# Patient Record
Sex: Female | Born: 1935 | Race: White | Hispanic: No | Marital: Married | Smoking: Former smoker
Health system: Southern US, Community
[De-identification: ages and names within clinical notes are randomized; demographics above are authoritative.]

## PROBLEM LIST (undated history)

## (undated) DIAGNOSIS — E785 Hyperlipidemia, unspecified: Secondary | ICD-10-CM

## (undated) DIAGNOSIS — M1990 Unspecified osteoarthritis, unspecified site: Secondary | ICD-10-CM

## (undated) DIAGNOSIS — K219 Gastro-esophageal reflux disease without esophagitis: Secondary | ICD-10-CM

## (undated) DIAGNOSIS — D649 Anemia, unspecified: Secondary | ICD-10-CM

## (undated) DIAGNOSIS — Z5189 Encounter for other specified aftercare: Secondary | ICD-10-CM

## (undated) DIAGNOSIS — K5790 Diverticulosis of intestine, part unspecified, without perforation or abscess without bleeding: Secondary | ICD-10-CM

## (undated) DIAGNOSIS — H3530 Unspecified macular degeneration: Secondary | ICD-10-CM

## (undated) DIAGNOSIS — M810 Age-related osteoporosis without current pathological fracture: Secondary | ICD-10-CM

## (undated) DIAGNOSIS — IMO0002 Reserved for concepts with insufficient information to code with codable children: Secondary | ICD-10-CM

## (undated) DIAGNOSIS — Z860100 Personal history of colon polyps, unspecified: Secondary | ICD-10-CM

## (undated) DIAGNOSIS — I6381 Other cerebral infarction due to occlusion or stenosis of small artery: Secondary | ICD-10-CM

## (undated) DIAGNOSIS — G4730 Sleep apnea, unspecified: Secondary | ICD-10-CM

## (undated) DIAGNOSIS — K222 Esophageal obstruction: Secondary | ICD-10-CM

## (undated) DIAGNOSIS — H269 Unspecified cataract: Secondary | ICD-10-CM

## (undated) DIAGNOSIS — G43009 Migraine without aura, not intractable, without status migrainosus: Secondary | ICD-10-CM

## (undated) DIAGNOSIS — K449 Diaphragmatic hernia without obstruction or gangrene: Secondary | ICD-10-CM

## (undated) HISTORY — DX: Age-related osteoporosis without current pathological fracture: M81.0

## (undated) HISTORY — DX: Unspecified cataract: H26.9

## (undated) HISTORY — DX: Personal history of colonic polyps: Z86.010

## (undated) HISTORY — DX: Sleep apnea, unspecified: G47.30

## (undated) HISTORY — PX: COLONOSCOPY: SHX174

## (undated) HISTORY — DX: Hyperlipidemia, unspecified: E78.5

## (undated) HISTORY — PX: TOTAL ABDOMINAL HYSTERECTOMY: SHX209

## (undated) HISTORY — DX: Reserved for concepts with insufficient information to code with codable children: IMO0002

## (undated) HISTORY — DX: Unspecified osteoarthritis, unspecified site: M19.90

## (undated) HISTORY — PX: ESOPHAGEAL DILATION: SHX303

## (undated) HISTORY — PX: CHOLECYSTECTOMY: SHX55

## (undated) HISTORY — DX: Other cerebral infarction due to occlusion or stenosis of small artery: I63.81

## (undated) HISTORY — DX: Anemia, unspecified: D64.9

## (undated) HISTORY — PX: INGUINAL HERNIA REPAIR: SHX194

## (undated) HISTORY — DX: Encounter for other specified aftercare: Z51.89

## (undated) HISTORY — DX: Unspecified macular degeneration: H35.30

## (undated) HISTORY — DX: Esophageal obstruction: K22.2

## (undated) HISTORY — PX: EYE SURGERY: SHX253

## (undated) HISTORY — DX: Diverticulosis of intestine, part unspecified, without perforation or abscess without bleeding: K57.90

## (undated) HISTORY — DX: Gastro-esophageal reflux disease without esophagitis: K21.9

## (undated) HISTORY — DX: Migraine without aura, not intractable, without status migrainosus: G43.009

## (undated) HISTORY — DX: Personal history of colon polyps, unspecified: Z86.0100

## (undated) HISTORY — DX: Diaphragmatic hernia without obstruction or gangrene: K44.9

## (undated) HISTORY — PX: UPPER GASTROINTESTINAL ENDOSCOPY: SHX188

---

## 2002-02-28 ENCOUNTER — Encounter (INDEPENDENT_AMBULATORY_CARE_PROVIDER_SITE_OTHER): Payer: Self-pay | Admitting: *Deleted

## 2002-02-28 LAB — CONVERTED CEMR LAB

## 2003-05-22 ENCOUNTER — Encounter (INDEPENDENT_AMBULATORY_CARE_PROVIDER_SITE_OTHER): Payer: Self-pay | Admitting: Specialist

## 2003-05-22 ENCOUNTER — Ambulatory Visit (HOSPITAL_BASED_OUTPATIENT_CLINIC_OR_DEPARTMENT_OTHER): Admission: RE | Admit: 2003-05-22 | Discharge: 2003-05-22 | Payer: Self-pay | Admitting: *Deleted

## 2003-06-20 ENCOUNTER — Encounter: Payer: Self-pay | Admitting: Internal Medicine

## 2003-06-20 ENCOUNTER — Ambulatory Visit (HOSPITAL_COMMUNITY): Admission: RE | Admit: 2003-06-20 | Discharge: 2003-06-20 | Payer: Self-pay | Admitting: Internal Medicine

## 2004-07-22 ENCOUNTER — Ambulatory Visit (HOSPITAL_COMMUNITY): Admission: RE | Admit: 2004-07-22 | Discharge: 2004-07-22 | Payer: Self-pay | Admitting: Internal Medicine

## 2004-09-19 ENCOUNTER — Encounter: Admission: RE | Admit: 2004-09-19 | Discharge: 2004-09-19 | Payer: Self-pay | Admitting: Internal Medicine

## 2004-10-03 ENCOUNTER — Ambulatory Visit: Payer: Self-pay | Admitting: Internal Medicine

## 2005-03-30 ENCOUNTER — Ambulatory Visit: Payer: Self-pay | Admitting: Internal Medicine

## 2005-04-10 ENCOUNTER — Ambulatory Visit: Payer: Self-pay | Admitting: Internal Medicine

## 2005-07-02 ENCOUNTER — Ambulatory Visit: Payer: Self-pay | Admitting: Internal Medicine

## 2005-07-10 ENCOUNTER — Ambulatory Visit: Payer: Self-pay

## 2005-08-25 ENCOUNTER — Ambulatory Visit: Payer: Self-pay | Admitting: Internal Medicine

## 2005-11-17 ENCOUNTER — Ambulatory Visit: Payer: Self-pay | Admitting: Internal Medicine

## 2006-01-26 ENCOUNTER — Ambulatory Visit (HOSPITAL_COMMUNITY): Admission: RE | Admit: 2006-01-26 | Discharge: 2006-01-26 | Payer: Self-pay | Admitting: Internal Medicine

## 2006-08-24 ENCOUNTER — Ambulatory Visit: Payer: Self-pay | Admitting: Internal Medicine

## 2006-08-31 ENCOUNTER — Ambulatory Visit: Payer: Self-pay | Admitting: Internal Medicine

## 2006-09-17 ENCOUNTER — Ambulatory Visit: Payer: Self-pay | Admitting: Internal Medicine

## 2007-01-28 ENCOUNTER — Ambulatory Visit: Payer: Self-pay | Admitting: Internal Medicine

## 2007-01-28 ENCOUNTER — Ambulatory Visit (HOSPITAL_COMMUNITY): Admission: RE | Admit: 2007-01-28 | Discharge: 2007-01-28 | Payer: Self-pay | Admitting: Geriatric Medicine

## 2007-02-16 ENCOUNTER — Ambulatory Visit: Payer: Self-pay | Admitting: Internal Medicine

## 2007-02-16 LAB — CONVERTED CEMR LAB
Cholesterol: 190 mg/dL
Glucose, Bld: 104 mg/dL — ABNORMAL HIGH
HDL: 58.8 mg/dL
LDL Cholesterol: 101 mg/dL — ABNORMAL HIGH
Total CHOL/HDL Ratio: 3.2
Triglycerides: 150 mg/dL — ABNORMAL HIGH
VLDL: 30 mg/dL

## 2007-05-09 ENCOUNTER — Encounter: Payer: Self-pay | Admitting: Internal Medicine

## 2007-06-10 ENCOUNTER — Encounter (INDEPENDENT_AMBULATORY_CARE_PROVIDER_SITE_OTHER): Payer: Self-pay | Admitting: *Deleted

## 2007-09-27 DIAGNOSIS — G43009 Migraine without aura, not intractable, without status migrainosus: Secondary | ICD-10-CM | POA: Insufficient documentation

## 2007-09-27 DIAGNOSIS — K219 Gastro-esophageal reflux disease without esophagitis: Secondary | ICD-10-CM | POA: Insufficient documentation

## 2007-09-27 DIAGNOSIS — IMO0002 Reserved for concepts with insufficient information to code with codable children: Secondary | ICD-10-CM | POA: Insufficient documentation

## 2007-10-13 DIAGNOSIS — D126 Benign neoplasm of colon, unspecified: Secondary | ICD-10-CM | POA: Insufficient documentation

## 2007-12-01 DIAGNOSIS — K449 Diaphragmatic hernia without obstruction or gangrene: Secondary | ICD-10-CM

## 2007-12-01 HISTORY — DX: Diaphragmatic hernia without obstruction or gangrene: K44.9

## 2008-01-03 ENCOUNTER — Encounter: Payer: Self-pay | Admitting: Internal Medicine

## 2008-02-06 ENCOUNTER — Encounter (INDEPENDENT_AMBULATORY_CARE_PROVIDER_SITE_OTHER): Payer: Self-pay | Admitting: *Deleted

## 2008-02-06 DIAGNOSIS — E785 Hyperlipidemia, unspecified: Secondary | ICD-10-CM | POA: Insufficient documentation

## 2008-02-06 DIAGNOSIS — I6350 Cerebral infarction due to unspecified occlusion or stenosis of unspecified cerebral artery: Secondary | ICD-10-CM | POA: Insufficient documentation

## 2008-02-07 ENCOUNTER — Ambulatory Visit (HOSPITAL_COMMUNITY): Admission: RE | Admit: 2008-02-07 | Discharge: 2008-02-07 | Payer: Self-pay | Admitting: Internal Medicine

## 2008-02-10 ENCOUNTER — Ambulatory Visit: Payer: Self-pay | Admitting: Internal Medicine

## 2008-02-10 DIAGNOSIS — R1319 Other dysphagia: Secondary | ICD-10-CM | POA: Insufficient documentation

## 2008-02-10 DIAGNOSIS — R252 Cramp and spasm: Secondary | ICD-10-CM | POA: Insufficient documentation

## 2008-02-10 DIAGNOSIS — R51 Headache: Secondary | ICD-10-CM | POA: Insufficient documentation

## 2008-02-10 DIAGNOSIS — R519 Headache, unspecified: Secondary | ICD-10-CM | POA: Insufficient documentation

## 2008-02-10 LAB — CONVERTED CEMR LAB
Cholesterol, target level: 200 mg/dL
HDL goal, serum: 40 mg/dL
LDL Goal: 100 mg/dL

## 2008-02-11 LAB — CONVERTED CEMR LAB
ALT: 100 units/L — ABNORMAL HIGH (ref 0–35)
AST: 29 units/L (ref 0–37)
Albumin: 4 g/dL (ref 3.5–5.2)
Alkaline Phosphatase: 71 units/L (ref 39–117)
BUN: 10 mg/dL (ref 6–23)
Basophils Absolute: 0.1 10*3/uL (ref 0.0–0.1)
Basophils Relative: 1 % (ref 0.0–1.0)
Bilirubin, Direct: 0.1 mg/dL (ref 0.0–0.3)
CO2: 29 meq/L (ref 19–32)
Calcium: 9.3 mg/dL (ref 8.4–10.5)
Chloride: 104 meq/L (ref 96–112)
Cholesterol: 195 mg/dL (ref 0–200)
Creatinine, Ser: 0.7 mg/dL (ref 0.4–1.2)
Eosinophils Absolute: 0.2 10*3/uL (ref 0.0–0.6)
Eosinophils Relative: 2 % (ref 0.0–5.0)
GFR calc Af Amer: 107 mL/min
GFR calc non Af Amer: 88 mL/min
Glucose, Bld: 81 mg/dL (ref 70–99)
HCT: 41.6 % (ref 36.0–46.0)
HDL: 56.2 mg/dL (ref >39.0)
Hemoglobin: 13.6 g/dL (ref 12.0–15.0)
Hgb A1c MFr Bld: 6 % (ref 4.6–6.0)
Iron: 43 ug/dL (ref 42–145)
LDL Cholesterol: 117 mg/dL — ABNORMAL HIGH (ref 0–99)
Lymphocytes Relative: 27.4 % (ref 12.0–46.0)
MCHC: 32.8 g/dL (ref 30.0–36.0)
MCV: 79.3 fL (ref 78.0–100.0)
Monocytes Absolute: 0.7 10*3/uL (ref 0.2–0.7)
Monocytes Relative: 8.7 % (ref 3.0–11.0)
Neutro Abs: 4.9 10*3/uL (ref 1.4–7.7)
Neutrophils Relative %: 60.9 % (ref 43.0–77.0)
Platelets: 312 10*3/uL (ref 150–400)
Potassium: 3.9 meq/L (ref 3.5–5.1)
RBC: 5.24 M/uL — ABNORMAL HIGH (ref 3.87–5.11)
RDW: 14.8 % — ABNORMAL HIGH (ref 11.5–14.6)
Saturation Ratios: 9.8 % — ABNORMAL LOW (ref 20.0–50.0)
Sodium: 138 meq/L (ref 135–145)
TSH: 1.36 microintl units/mL (ref 0.35–5.50)
Total Bilirubin: 0.7 mg/dL (ref 0.3–1.2)
Total CHOL/HDL Ratio: 3.5
Total Protein: 7.2 g/dL (ref 6.0–8.3)
Transferrin: 314.4 mg/dL (ref >=212.0)
Triglycerides: 110 mg/dL (ref 0–149)
VLDL: 22 mg/dL (ref 0–40)
WBC: 8.1 10*3/uL (ref 4.5–10.5)

## 2008-02-13 ENCOUNTER — Encounter (INDEPENDENT_AMBULATORY_CARE_PROVIDER_SITE_OTHER): Payer: Self-pay | Admitting: *Deleted

## 2008-02-18 LAB — CONVERTED CEMR LAB: Vit D, 1,25-Dihydroxy: 29 — ABNORMAL LOW (ref 30–89)

## 2008-02-20 ENCOUNTER — Encounter (INDEPENDENT_AMBULATORY_CARE_PROVIDER_SITE_OTHER): Payer: Self-pay | Admitting: *Deleted

## 2008-03-01 ENCOUNTER — Encounter: Payer: Self-pay | Admitting: Internal Medicine

## 2008-03-05 ENCOUNTER — Encounter (INDEPENDENT_AMBULATORY_CARE_PROVIDER_SITE_OTHER): Payer: Self-pay | Admitting: *Deleted

## 2008-03-05 ENCOUNTER — Ambulatory Visit: Payer: Self-pay | Admitting: Internal Medicine

## 2008-03-05 LAB — CONVERTED CEMR LAB
OCCULT 1: NEGATIVE
OCCULT 2: NEGATIVE
OCCULT 3: NEGATIVE

## 2008-03-19 ENCOUNTER — Ambulatory Visit: Payer: Self-pay | Admitting: Internal Medicine

## 2008-03-21 ENCOUNTER — Ambulatory Visit (HOSPITAL_COMMUNITY): Admission: RE | Admit: 2008-03-21 | Discharge: 2008-03-21 | Payer: Self-pay | Admitting: Internal Medicine

## 2008-03-26 ENCOUNTER — Encounter: Payer: Self-pay | Admitting: Internal Medicine

## 2008-03-27 ENCOUNTER — Ambulatory Visit: Payer: Self-pay | Admitting: Internal Medicine

## 2008-03-27 ENCOUNTER — Encounter: Payer: Self-pay | Admitting: Internal Medicine

## 2008-03-27 DIAGNOSIS — K2900 Acute gastritis without bleeding: Secondary | ICD-10-CM | POA: Insufficient documentation

## 2008-04-25 ENCOUNTER — Telehealth (INDEPENDENT_AMBULATORY_CARE_PROVIDER_SITE_OTHER): Payer: Self-pay | Admitting: *Deleted

## 2008-05-02 ENCOUNTER — Ambulatory Visit: Payer: Self-pay | Admitting: Internal Medicine

## 2008-05-02 DIAGNOSIS — R1013 Epigastric pain: Secondary | ICD-10-CM | POA: Insufficient documentation

## 2008-05-02 DIAGNOSIS — K222 Esophageal obstruction: Secondary | ICD-10-CM | POA: Insufficient documentation

## 2008-05-02 DIAGNOSIS — K5730 Diverticulosis of large intestine without perforation or abscess without bleeding: Secondary | ICD-10-CM | POA: Insufficient documentation

## 2008-05-02 DIAGNOSIS — K5732 Diverticulitis of large intestine without perforation or abscess without bleeding: Secondary | ICD-10-CM | POA: Insufficient documentation

## 2008-05-08 ENCOUNTER — Telehealth: Payer: Self-pay | Admitting: Internal Medicine

## 2008-06-22 ENCOUNTER — Encounter: Payer: Self-pay | Admitting: Internal Medicine

## 2008-07-26 ENCOUNTER — Encounter: Admission: RE | Admit: 2008-07-26 | Discharge: 2008-07-26 | Payer: Self-pay | Admitting: General Surgery

## 2008-08-03 ENCOUNTER — Encounter: Payer: Self-pay | Admitting: Internal Medicine

## 2008-08-24 ENCOUNTER — Encounter: Payer: Self-pay | Admitting: Internal Medicine

## 2008-10-16 ENCOUNTER — Encounter: Payer: Self-pay | Admitting: Internal Medicine

## 2008-10-17 ENCOUNTER — Telehealth (INDEPENDENT_AMBULATORY_CARE_PROVIDER_SITE_OTHER): Payer: Self-pay | Admitting: *Deleted

## 2008-10-17 DIAGNOSIS — M899 Disorder of bone, unspecified: Secondary | ICD-10-CM | POA: Insufficient documentation

## 2008-10-17 DIAGNOSIS — M949 Disorder of cartilage, unspecified: Secondary | ICD-10-CM

## 2008-12-05 ENCOUNTER — Encounter: Payer: Self-pay | Admitting: Internal Medicine

## 2009-05-13 ENCOUNTER — Encounter: Payer: Self-pay | Admitting: Internal Medicine

## 2009-09-05 ENCOUNTER — Telehealth (INDEPENDENT_AMBULATORY_CARE_PROVIDER_SITE_OTHER): Payer: Self-pay | Admitting: *Deleted

## 2009-09-05 ENCOUNTER — Ambulatory Visit: Payer: Self-pay | Admitting: Internal Medicine

## 2009-10-04 ENCOUNTER — Encounter: Payer: Self-pay | Admitting: Internal Medicine

## 2009-11-13 ENCOUNTER — Ambulatory Visit (HOSPITAL_BASED_OUTPATIENT_CLINIC_OR_DEPARTMENT_OTHER): Admission: RE | Admit: 2009-11-13 | Discharge: 2009-11-13 | Payer: Self-pay | Admitting: General Surgery

## 2009-12-05 ENCOUNTER — Encounter: Payer: Self-pay | Admitting: Internal Medicine

## 2010-12-18 ENCOUNTER — Other Ambulatory Visit (HOSPITAL_COMMUNITY): Payer: Self-pay | Admitting: Internal Medicine

## 2010-12-18 DIAGNOSIS — Z780 Asymptomatic menopausal state: Secondary | ICD-10-CM

## 2010-12-18 DIAGNOSIS — Z0000 Encounter for general adult medical examination without abnormal findings: Secondary | ICD-10-CM

## 2010-12-20 ENCOUNTER — Encounter: Payer: Self-pay | Admitting: Internal Medicine

## 2010-12-21 ENCOUNTER — Encounter: Payer: Self-pay | Admitting: Internal Medicine

## 2010-12-30 NOTE — Procedures (Signed)
Summary: Gastroenterology colon  Gastroenterology colon   Imported By: Milford Cage CMA 05/01/2008 10:43:52  _____________________________________________________________________  External Attachment:    Type:   Image     Comment:   External Document

## 2010-12-30 NOTE — Letter (Signed)
Summary: Primary Care Consult Scheduled Letter  [facility]  [address] [place], [place] [address]   Phone: [phone]  Fax: [phone]      02/13/2008 MRN: [id number]  [facility] [address] [place], [place]  [address]    Dear [name] [name],      We have scheduled an appointment for you.  At the recommendation of Dr.HOPPER, we have scheduled you a consult with DR Marina Goodell [facility] [address] 3RD FLOOR on 04.20.09 @ 10:30 CK IN 10:15.  Their phone number is [phone].  If this appointment day and time is not convenient for you, please feel free to call the office of the doctor you are being referred to at the number listed above and reschedule the appointment.     It is important for you to keep your scheduled appointments. We are here to make sure you are given good patient care. If yu have questions or you have made changes to your appointment, please notify us at  [phone], ask for [name].    Thank you,  Patient Care Coordinator  at [facility]

## 2010-12-30 NOTE — Letter (Signed)
Summary: Results Follow up Letter  [facility] [place]  [address] [place], [place] [address]   Phone: [phone]  Fax: [phone]    02/20/2008 MRN: [id number]  [facility] [address] [place], [place]  [address]  Dear [name] [name],  The following are the results of your recent test(s):  Test         Result    Pap Smear:        Normal _____  Not Normal _____ Comments: ______________________________________________________ Cholesterol: LDL(Bad cholesterol):         Your goal is less than:         HDL (Good cholesterol):       Your goal is more than: Comments:  ______________________________________________________ Mammogram:        Normal _____  Not Normal _____ Comments:  ___________________________________________________________________ Hemoccult:        Normal _____  Not normal _______ Comments:    _____________________________________________________________________ Other Tests:  Please see attached results and comments   We routinely do not discuss normal results over the telephone.  If you desire a copy of the results, or you have any questions about this information we can discuss them at your next office visit.   Sincerely,

## 2010-12-30 NOTE — Progress Notes (Signed)
Summary: hop--order for bone destiny  Phone Note Call from Patient Call back at Home Phone [phone]   Caller: Mom Summary of Call: Patient is calling for an orders for a bone destiny  to be fax today.--[phone] before 3 pm. Initial call taken by: Freddy Jaksch,  October 17, 2008 9:51 AM  Follow-up for Phone Call        FAXED Follow-up by: Shonna Chock,  October 17, 2008 2:19 PM  New Problems: OSTEOPENIA (ICD-733.90)   New Problems: OSTEOPENIA (ICD-733.90)

## 2010-12-30 NOTE — Letter (Signed)
Summary: Results Follow up Letter  [facility] [place]  [address] [place], [place] [address]   Phone: [phone]  Fax: [phone]    03/05/2008 MRN: [id number]  [facility] [address] [place], [place]  [address]  Dear Ms. Tortora,  The following are the results of your recent test(s):  Test         Result    Pap Smear:        Normal _____  Not Normal _____ Comments: ______________________________________________________ Cholesterol: LDL(Bad cholesterol):         Your goal is less than:         HDL (Good cholesterol):       Your goal is more than: Comments:  ______________________________________________________ Mammogram:        Normal _____  Not Normal _____ Comments:  ___________________________________________________________________ Hemoccult:        Normal _X____  Not normal _______ Comments:    _____________________________________________________________________ Other Tests:    We routinely do not discuss normal results over the telephone.  If you desire a copy of the results, or you have any questions about this information we can discuss them at your next office visit.   Sincerely,

## 2010-12-30 NOTE — Letter (Signed)
Summary: [facility]   Imported By: Lanelle Bal 12/23/2009 10:15:51  _____________________________________________________________________  External Attachment:    Type:   Image     Comment:   External Document

## 2010-12-30 NOTE — Procedures (Signed)
Summary: Colonoscopy  Colonoscopy   Imported By: Freddy Jaksch 04/03/2008 11:36:41  _____________________________________________________________________  External Attachment:    Type:   Image     Comment:   External Document  Appended Document: Colonoscopy Tics & tiny polyp

## 2010-12-30 NOTE — Letter (Signed)
Summary: [facility]   Imported By: Lanelle Bal 12/23/2009 10:16:47  _____________________________________________________________________  External Attachment:    Type:   Image     Comment:   External Document

## 2010-12-30 NOTE — Progress Notes (Signed)
Summary: ? regarding referral to another dr  Phone Note Call from Patient Call back at Home Phone [phone]   Call For: Marina Goodell Summary of Call: ? f/u to Dr Johna Sheriff...are they suppose to call his office for appointment. Rosanne Ashing, spouse called. Patient's chart has been requested.  Initial call taken by: Verdell Face,  May 08, 2008 10:46 AM  Follow-up for Phone Call        05-08-08 Called pt and [facility] that we faxed Daissy's information to Dr Romeo Apple Hoxworth's office on 05-03-08.  We were told they would be calling her with an appt.  I told them to call me early next week if they havn't heard anything from that office. Lowry Ram CMA

## 2010-12-30 NOTE — Medication Information (Signed)
Summary: Refill Authorization for Omeprazole/[facility]  Refill Authorization for Omeprazole/[facility]   Imported By: Maryln Gottron 05/15/2009 13:20:38  _____________________________________________________________________  External Attachment:    Type:   Image     Comment:   External Document

## 2010-12-30 NOTE — Consult Note (Signed)
Summary: [facility]   Imported By: Lanelle Bal 07/04/2008 12:25:01  _____________________________________________________________________  External Attachment:    Type:   Image     Comment:   External Document  Appended Document: [facility] Dr Johna Sheriff : gallstone; surgery planned

## 2010-12-30 NOTE — Assessment & Plan Note (Signed)
Summary: HEADACHES GETTING WORSE/RH.....   Vital Signs:  Patient profile:   75 year old female Weight:      157.4 pounds BMI:     27.12 Temp:     98.6 degrees F oral Pulse rate:   80 / minute Resp:     17 per minute BP sitting:   138 / 78  (left arm) Cuff size:   large  Vitals Entered By: Shonna Chock (September 05, 2009 12:12 PM) CC: 1.) Patient would like a referral to Dr.Love to address Headaches  2.) Rash on stomach-? if related to Excedrin 3.)Knot in lower abdominal area-right side Comments REVIEWED MED LIST, PATIENT AGREED DOSE AND INSTRUCTION CORRECT    Primary Care Provider:  Marga Melnick MD  CC:  1.) Patient would like a referral to Dr.Love to address Headaches  2.) Rash on stomach-? if related to Excedrin 3.)Knot in lower abdominal area-right side.  History of Present Illness: She has daily diffuse, non specific  headaches in different locations. No prodrome or aura with headaches. Headaches last for 2 hours to 3 days. "Dr Clarisse Gouge said he did not know what caused them" . MRI negative except for 1or 2 "minimstrokes".Previously on Lithium with some improvement but "I didn't want to stay on it".  Allergies (verified): 1)  ! Oxycodone Hcl (Oxycodone Hcl)  Review of Systems Neuro:  Denies brief paralysis, numbness, tingling, and weakness.  Physical Exam  General:  in no acute distress; alert,appropriate and cooperative throughout examination Eyes:  No corneal or conjunctival inflammation noted. EOMI. Perrla. Field of  Vision grossly normal. Mouth:  Oral mucosa and oropharynx without lesions or exudates.  Tongue w/o deviation Neurologic:  alert & oriented X3, cranial nerves II-XII intact, strength normal in all extremities, sensation to  light touch decreased L face, and DTRs symmetrical and normal.     Impression & Recommendations:  Problem # 1:  HEADACHE (ICD-784.0)  Complete Medication List: 1)  Omeprazole 20 Mg Cpdr (Omeprazole) .Marland Kitchen.. 1 by mouth once daily 2)   Multivitamins Tabs (Multiple vitamin) .... Take 1 tablet by mouth once a day 3)  Lutein Vision Blend Caps (Misc natural products) .Marland Kitchen.. 1 by mouth two times a day 4)  Calcium 500/d 500-200 Mg-unit Tabs (Calcium carbonate-vitamin d) .Marland Kitchen.. 1 by mouth once daily 5)  Topamax 25 Mg Tabs (Topiramate) .Marland Kitchen.. 1 at bedtime x 2 weeks then 2  at bedtime  Patient Instructions: 1)  Keep Headache Diary to assess response to Topamax . 2)  Please schedule a follow-up appointment in 1 month. Prescriptions: TOPAMAX 25 MG TABS (TOPIRAMATE) 1 at bedtime X 2 weeks then 2  at bedtime  #60 x 1   Entered and Authorized by:   Marga Melnick MD   Signed by:   Marga Melnick MD on 09/05/2009   Method used:   Print then Give to Patient   RxID:   [id number]

## 2010-12-30 NOTE — Procedures (Signed)
Summary: Gastroenterology EGD  Gastroenterology EGD   Imported By: Milford Cage CMA 05/01/2008 10:44:28  _____________________________________________________________________  External Attachment:    Type:   Image     Comment:   External Document

## 2010-12-30 NOTE — Letter (Signed)
Summary: [facility]  [facility]   Imported By: Job Founds 05/30/2007 11:29:59  _____________________________________________________________________  External Attachment:    Type:   Image     Comment:   External Document

## 2010-12-30 NOTE — Assessment & Plan Note (Signed)
Summary: rov per dr. Gwyneth Sprout $50 for NOS statedand written for pt./sh   History of Present Illness Visit Type: follow up Primary GI MD: Yancey Flemings MD Primary Provider: Marga Melnick MD Chief Complaint: continued epigastric pain, denies further dysphagia History of Present Illness:   [name] [name] presents today for followup. She was initially evaluated March 19, 2008 for reflux disease, abdominal pain, dysphagia, and surveillance colonoscopy. See that dictation for details. For her chronic reflux and dysphagia. Upper endoscopy was scheduled. Her acute intermittent epigastric pain was not felt to be necessarily related. Thus, an abdominal ultrasound was ordered. This revealed gallstones. Her upper endoscopy was performed March 27, 2008 and revealed a benign distal esophageal stricture, as well as mild gastritis. The stricture was dilated. Biopsies for Helicobacter pylori negative. Proton pump inhibitor continued. Colonoscopy, that same day, revealed diverticulosis and a diminutive colon polyp. She presents now for followup. She is currently on omeprazole 20 mg daily and rarely twice daily. Classic reflux symptoms are controlled. Dysphagia has resolved. She did experience one episode of epigastric/right-sided pain since her last visit. She is accompanied by her husband, [name] [name]. Her workup to date and my impressions were reviewed in detail. They had multiple excellent questions which were answered to their satisfaction.             Updated Prior Medication List: OMEPRAZOLE 20 MG  CPDR (OMEPRAZOLE) 1 by mouth bid MULTIVITAMINS   TABS (MULTIPLE VITAMIN) Take 1 tablet by mouth once a day LUTEIN VISION BLEND   CAPS (MISC NATURAL PRODUCTS) 1 by mouth two times a day CALCIUM 500/D 500-200 MG-UNIT  TABS (CALCIUM CARBONATE-VITAMIN D) 1 by mouth once daily VITAMIN E 1000 UNIT  CREA (VITAMIN E) 1 by mouth once daily  Current Allergies (reviewed today): No known allergies   Past Medical History:    Reviewed  history from 04/30/2008 and no changes required:       Current Problems:        Hx of LACUNAR INFARCTION (ICD-434.91)       HYPERLIPIDEMIA (ICD-272.4)       COLONIC POLYPS (ICD-211.3)       HERNIATED DISC (ICD-722.2)       COMMON MIGRAINE (ICD-346.10)       ACID REFLUX DISEASE (ICD-530.81)       Diverticulosis       Esophageal Stricture  Past Surgical History:    Hysterectomy & BSO    Inguinal herniorrhaphy-right (05/2003)    Herniated disc repair    G3 P3    Abnl. mammogram neg. on follow-up (2001)    Endoscopy- neg. (1999) in [place]; April 2009, [place]    Colonoscopy- polyp (2002)in [place]; April 2009, [place]   Family History:        Siblings: brother- Kidney CA                  sister- Brain Aneursym (died @ 61)    Maternal aunts:  Breast CA    Paternal aunts:  Breast CA    Family History of Colon Cancer:  Social History:    Never Smoked    Alcohol use-no    Retired. Volunteer work at the hospital    married with children    Review of Systems       noncontributory   Vital Signs:  Patient Profile:   75 Years Old Female Height:     64 inches Weight:      154 pounds BMI:     26.53 Pulse rate:  80 / minute Pulse rhythm:   regular BP sitting:   128 / 70  Vitals Entered By: Darcey Nora RN (May 02, 2008 8:31 AM)                  Physical Exam  General:     Well developed, well nourished, no acute distress. Head:     Normocephalic and atraumatic. Eyes:     PERRLA, no icterus. Lungs:     Clear throughout to auscultation. Heart:     Regular rate and rhythm; no murmurs, rubs,  or bruits. Abdomen:     Soft, nontender and nondistended. No masses, hepatosplenomegaly or hernias noted. Normal bowel sounds. Pulses:     Normal pulses noted. Extremities:     No clubbing, cyanosis, edema or deformities noted. Neurologic:     Alert and  oriented x4;  grossly normal neurologically. Skin:     Intact without significant lesions or rashes. Psych:      Alert and cooperative. Normal mood and affect.    Impression & Recommendations:  Problem # 1:  GERD (ICD-530.81) Symptoms controlled with proton pump inhibitor therapy, generally  once daily. Dysphagia resolved post dilation.  Plan: #1 continue proton pump inhibitor therapy. #2 reflux precautions. #3 followup as needed for breakthrough symptoms, refractory to medication or recurrent dysphagia  Problem # 2:  ESOPHAGEAL STRICTURE (ICD-530.3) Benign, secondary to reflux disease. Currently asymptomatic post dilation. Followup as needed, as discussed above.  Problem # 3:  ABDOMINAL PAIN-EPIGASTRIC (ICD-789.06) Recurrent episodic abdominal pain, most consistent with symptomatic cholelithiasis.  Plan: Refer to Dr. Glenna Fellows for consideration of laparoscopic cholecystectomy.  Problem # 4:  COLONIC POLYPS (ICD-211.3) Followup colonoscopy for problems only.  Problem # 5:  DIVERTICULOSIS-COLON (ICD-562.10) Marked changes on colonoscopy. Clinically asymptomatic.     ] Copy to: Dr. Glenna Fellows

## 2010-12-30 NOTE — Consult Note (Signed)
Summary: [facility]   Imported By: Esmeralda Links D'jimraou 09/18/2008 12:02:56  _____________________________________________________________________  External Attachment:    Type:   Image     Comment:   External Document

## 2010-12-30 NOTE — Letter (Signed)
Summary: Fax Requesting Order for Bone Density/[facility]  Fax Requesting Order for Bone [facility] Radiology Dept.   Imported By: Lanelle Bal 10/18/2008 09:08:01  _____________________________________________________________________  External Attachment:    Type:   Image     Comment:   External Document

## 2010-12-30 NOTE — Letter (Signed)
Summary: Results Follow up Letter  [facility] [place]  [address] [place], [place] [address]   Phone: [phone]  Fax: [phone]    02/13/2008 MRN: [id number]  [facility] [address] [place], [place]  [address]  Dear [name] [name],  The following are the results of your recent test(s):  Test         Result    Pap Smear:        Normal _____  Not Normal _____ Comments: ______________________________________________________ Cholesterol: LDL(Bad cholesterol):         Your goal is less than:         HDL (Good cholesterol):       Your goal is more than: Comments:  ______________________________________________________ Mammogram:        Normal _____  Not Normal _____ Comments:  ___________________________________________________________________ Hemoccult:        Normal _____  Not normal _______ Comments:    _____________________________________________________________________ Other Tests:  Please see attached results and comments   We routinely do not discuss normal results over the telephone.  If you desire a copy of the results, or you have any questions about this information we can discuss them at your next office visit.   Sincerely,

## 2010-12-30 NOTE — Letter (Signed)
Summary: [facility]   Imported By: Lanelle Bal 12/24/2008 10:35:02  _____________________________________________________________________  External Attachment:    Type:   Image     Comment:   External Document

## 2010-12-30 NOTE — Letter (Signed)
[facility]   Imported By: Freddy Jaksch 01/12/2008 11:36:35  _____________________________________________________________________  External Attachment:    Type:   Image     Comment:   External Document

## 2010-12-30 NOTE — Progress Notes (Signed)
Summary: generic   Phone Note Refill Request Message from:  Fax from Pharmacy on [facility] [address]  Refills Requested: Medication #1:  TOPAMAX 25 MG TABS 1 at bedtime X 2 weeks then 2  at bedtime. pt given discount card but it was expired so pt copay is very elevated. rx was written to [facility] wants to know if it is ok to dispense a generic of this med........................Marland KitchenFelecia Deloach CMA  September 05, 2009 4:28 PM    Follow-up for Phone Call        Per Dr.Hopper ok to give generic, spoke with pharmacist. Follow-up by: Shonna Chock,  September 05, 2009 5:11 PM

## 2010-12-30 NOTE — Procedures (Signed)
Summary: EGD  EGD   Imported By: Freddy Jaksch 04/03/2008 11:38:10  _____________________________________________________________________  External Attachment:    Type:   Image     Comment:   External Document  Appended Document: EGD Dr Marina Goodell: ERD,gastritis,stricture + gall stones with intermittent pain

## 2010-12-30 NOTE — Progress Notes (Signed)
Summary: BILLING SITUATION  Phone Note Call from Patient   Caller: Townsend Roger Call For: MANAGER-CYNTHIA Complaint: Earache/Ear Infection Summary of Call: Townsend Roger IS CALLING  ABOUT A BILL FOR HIS WIFE,THAT WAS DENIED BY THE INSURANCE COMPANY FOR LABS DRAWN ON 02/10/2008 (VIT-D LEVEL) PATIENT WAS TOLD BY [facility] THAT IT WAS CODED INCORRECTLEY.  MR.Merfeld HAS CONTACTED [facility] X 2, OUR BILLING DEPARTMENT X 1 AND THIS IS HIS SECOND TIME CALLING us. I COULD TELL  MR.Hallisey IS STARTING TO GET UPSET DUE TO BEING TOLD TO CALL THIS AND THAT PERSON AND NO ONE IS HELPING HIM. I DID EXPLAIN THAT IM NOT ADVANCED IN HANDLING THIS SITUATION OUR MANAGER IS OUT TODAY AND I WILL FORWARD THIS MESSAGE AND HAVE HER CONTACT HIM WHEN SHE RETURNS. MR.Tonelli OK'D THAT./Chrae Malloy  Apr 25, 2008 10:44 AM   Follow-up for Phone Call        CALLED SPECIUM...SP W/ VICKY..REQUEST TO CHANGE CODE TO PAIN IN LEG...729.5. SAYS OTHER CODE WAS NOT COVERED UNDER INS...  CALLED PT TO INFORM.Marland KitchenMarland KitchenN/A  .Marland KitchenDaine Gip  Apr 26, 2008 2:42 PM  CALLED PT LEFT MESSAGE ON [facility].Marland KitchenDaine Gip  Apr 26, 2008 4:44 PM Follow-up by: Daine Gip,  Apr 26, 2008 2:43 PM

## 2010-12-30 NOTE — Letter (Signed)
Summary: External Correspondence--OFFICE VISIT FROM [facility]  External Correspondence--[facility]   Imported By: Freddy Jaksch 06/16/2007 15:42:48  _____________________________________________________________________  External Attachment:    Type:   Image     Comment:   OFFICE VISIT

## 2010-12-30 NOTE — Assessment & Plan Note (Signed)
Summary: cpx  ph   Vital Signs:  Patient Profile:   75 Years Old Female Weight:      150 pounds Pulse rate:   60 / minute Pulse rhythm:   regular Resp:     16 per minute BP sitting:   110 / 70  (left arm) Cuff size:   large  Pt. in pain?   no  Vitals Entered By: Wendall Stade (February 10, 2008 11:14 AM)                  Chief Complaint:  cpx.  History of Present Illness:  The patient stopped  Lipitor  because of burning in legs on treadmill; this resolved off Lipitor. She still have  to leg cramps which are  severe. Bananas & kiwi help the leg cramps . She wants to discuss a smaller vit and cal tabs  as she gets choked on the large tabs.   Dyspepsia History:      The patient has positive alarm features of dyspepsia which include dysphagia.  There is a prior history of GERD.  The patient does not have a prior history of documented ulcer disease.  The dominant symptom is not heartburn or acid reflux.  An H-2 blocker medication is not currently being taken.  She has no history of a positive H. Pylori serology.  A prior EGD has been done which showed no evidence for moderate or severe esophagitis or Barrett's esophagus.    Lipid Management History:      Positive NCEP/ATP III risk factors include female age 55 years old or older, family history for ischemic heart disease (males less than 21 years old), and prior stroke (or TIA).  Negative NCEP/ATP III risk factors include non-diabetic, non-tobacco-user status, non-hypertensive, no ASHD (atherosclerotic heart disease), no peripheral vascular disease, and no history of aortic aneurysm.         Current Allergies (reviewed today): ! * PROTONIX ! * SEPTRA DS  Past Medical History:    Reviewed history from 02/06/2008 and no changes required:       Current Problems:        Hx of LACUNAR INFARCTION (ICD-434.91)       HYPERLIPIDEMIA (ICD-272.4)       COLONIC POLYPS (ICD-211.3)       HERNIATED DISC (ICD-722.2)       COMMON MIGRAINE  (ICD-346.10)       ACID REFLUX DISEASE (ICD-530.81)  Past Surgical History:    Hysterectomy & BSO    Inguinal herniorrhaphy-right (05/2003)    Herniated disc repair    G3 P3    Abnl. mammogram neg. on follow-up (2001)    Endoscopy- neg. (1999) in [place]    Colonoscopy- polyp (2002)   Family History:    Reviewed history from 02/06/2008 and no changes required:       Father:        Mother:        Siblings: brother- Kidney CA                     sister- Brain Aneursym (died @ 74)       Maternal aunts:  Breast CA       Paternal aunts:  Breast CA  Social History:    Reviewed history from 02/06/2008 and no changes required:       Never Smoked       Alcohol use-no       Retired    Review of Systems  General      Complains of chills.      Denies fatigue, fever, sleep disorder, sweats, and weight loss.      Nocturnal chills X 2  in past  2 months  Eyes      Denies blurring, discharge, double vision, eye pain, red eye, and vision loss-both eyes.      Last exam 2007  ENT      Complains of nasal congestion, ringing in ears, and sinus pressure.      Denies decreased hearing, difficulty swallowing, ear discharge, earache, hoarseness, nosebleeds, postnasal drainage, and sore throat.      Congestion q am  CV      Denies bluish discoloration of lips or nails, chest pain or discomfort, difficulty breathing at night, difficulty breathing while lying down, leg cramps with exertion, palpitations, shortness of breath with exertion, swelling of feet, and swelling of hands.  Resp      Denies cough, excessive snoring, hypersomnolence, morning headaches, shortness of breath, and sputum productive.  GI      Denies abdominal pain, bloody stools, change in bowel habits, constipation, dark tarry stools, diarrhea, indigestion, nausea, and vomiting.      Pill dysphagia  GU      Complains of urinary frequency.      Denies abnormal vaginal bleeding, discharge, dysuria, and hematuria.       Nocturia X 2; last GYN 2001. Mammograms done this yr  MS      Complains of joint pain and cramps.      Denies joint redness, joint swelling, low back pain, and thoracic pain.      Leg cramps @ night , not with CVE. Pain wrists & shoulders. BMD neg > 2 yrs ago  Derm      Complains of itching and rash.      Denies changes in color of skin, changes in nail beds, dryness, excessive perspiration, flushing, hair loss, lesion(s), and poor wound healing.      Eczema as per Dr Nicholas Lose  Neuro      Complains of headaches.      Denies difficulty with concentration, disturbances in coordination, memory loss, numbness, poor balance, sensation of room spinning, and tingling.      Headaches 75% better. ?  BPV @ night   Psych      Denies anxiety, depression, easily angered, easily tearful, and irritability.  Endo      Denies cold intolerance, excessive hunger, excessive thirst, excessive urination, heat intolerance, polyuria, and weight change.      Excessive hunger only on Lithium from Dr Vela Prose  Heme      Denies abnormal bruising and bleeding.  Allergy      Denies hives or rash, itching eyes, seasonal allergies, and sneezing.   Physical Exam  General:     Well-developed,well-nourished,in no acute distress; alert,appropriate and cooperative throughout examination Head:     Normocephalic and atraumatic without obvious abnormalities. No apparent alopecia or balding. Eyes:     No corneal or conjunctival inflammation noted.Perrla. Funduscopic exam benign, without hemorrhages, exudates or papilledema. Art narrowing Ears:     Wax on L  Nose:     Septum to L with occlusion Mouth:     Oral mucosa and oropharynx without lesions or exudates.  Teeth in good repair. Neck:     No deformities, masses, or tenderness noted. Lungs:     Normal respiratory effort, chest expands symmetrically. Lungs are clear to auscultation, no crackles or wheezes. Heart:  Normal rate and regular rhythm. S1 and S2  normal without gallop, murmur, click, rub. S4. Abdomen:     Bowel sounds positive,abdomen soft and non-tender without masses, organomegaly or hernias noted. Msk:     No deformity or scoliosis noted of thoracic or lumbar spine.   Pulses:     R and L carotid,radial,dorsalis pedis and posterior tibial pulses are full and equal bilaterally Extremities:     DJD changes hands Neurologic:     alert & oriented X3, strength normal in all extremities, gait normal, and DTRs symmetrical and normal.   Skin:     Intact without suspicious lesions. Excoriated dermatitis @ ankles or rashes Cervical Nodes:     No lymphadenopathy noted Axillary Nodes:     No palpable lymphadenopathy Psych:     memory intact for recent and remote, normally interactive, good eye contact, not anxious appearing, and not depressed appearing.      Impression & Recommendations:  Problem # 1:  ROUTINE GENERAL MEDICAL EXAM@HEALTH  CARE FACL (ICD-V70.0)  Orders: EKG w/ Interpretation (93000) TLB-Lipid Panel (80061-LIPID) TLB-IBC Pnl(Iron/FE;Transferrin) (83550-IBC) TLB-BMP (Basic Metabolic Panel-BMET) (80048-METABOL) TLB-CBC Platelet - w/Differential (85025-CBCD) TLB-Hepatic/Liver Function Pnl (80076-HEPATIC) TLB-TSH (Thyroid Stimulating Hormone) (84443-TSH) TLB-A1C / Hgb A1C (Glycohemoglobin) (83036-A1C) T-Vitamin D (25-Hydroxy) (27253-66440)   Problem # 2:  Hx of LACUNAR INFARCTION (ICD-434.91)  Problem # 3:  HYPERLIPIDEMIA (ICD-272.4)  Orders: TLB-Lipid Panel (80061-LIPID) TLB-A1C / Hgb A1C (Glycohemoglobin) (83036-A1C)   Problem # 4:  GERD (ICD-530.81)  Her updated medication list for this problem includes:    Omeprazole 20 Mg Cpdr (Omeprazole) .Marland Kitchen... 1 by mouth bid  Orders: Gastroenterology Referral (GI)   Problem # 5:  OTHER DYSPHAGIA (ICD-787.29) wit large pills Orders: Gastroenterology Referral (GI)   Problem # 6:  COLONIC POLYPS (ICD-211.3)  Orders: Gastroenterology Referral  (GI)   Complete Medication List: 1)  Omeprazole 20 Mg Cpdr (Omeprazole) .Marland Kitchen.. 1 by mouth bid 2)  Multivitamins Tabs (Multiple vitamin) .... Take 1 tablet by mouth once a day 3)  Calcium  4)  Vitamin E  5)  Flurbiprofen 100 Mg Tabs (Flurbiprofen) .... As needed only dr. Clarisse Gouge  Lipid Assessment/Plan:      Based on NCEP/ATP III, the patient's risk factor category is "history of coronary disease, peripheral vascular disease, cerebrovascular disease, or aortic aneurysm".  From this information, the patient's calculated lipid goals are as follows: Total cholesterol goal is 200; LDL cholesterol goal is 100; HDL cholesterol goal is 40; Triglyceride goal is 150.  Her LDL cholesterol goal has not been met.  Secondary causes for hyperlipidemia have been ruled out.  She has been counseled on adjunctive measures for lowering her cholesterol and has been provided with dietary instructions.     Patient Instructions: 1)  Complete stool cards. AS Tylenol at bedtime as needed & 1-3 coated  baby ASA in as needed . LDL goal = < 100. Chew TUMS; 600 mg two times a day . Vit D 1000 International Units once daily.    ]

## 2011-01-06 ENCOUNTER — Ambulatory Visit (HOSPITAL_COMMUNITY)
Admission: RE | Admit: 2011-01-06 | Discharge: 2011-01-06 | Disposition: A | Discharge status: Home / Self Care | Payer: Medicare PPO | Source: Ambulatory Visit | Attending: Internal Medicine | Admitting: Internal Medicine

## 2011-01-06 ENCOUNTER — Ambulatory Visit (HOSPITAL_COMMUNITY): Admission: RE | Admit: 2011-01-06 | Payer: Self-pay | Source: Home / Self Care | Admitting: Internal Medicine

## 2011-01-06 DIAGNOSIS — Z1231 Encounter for screening mammogram for malignant neoplasm of breast: Secondary | ICD-10-CM | POA: Insufficient documentation

## 2011-01-06 DIAGNOSIS — Z780 Asymptomatic menopausal state: Secondary | ICD-10-CM | POA: Insufficient documentation

## 2011-01-06 DIAGNOSIS — Z0000 Encounter for general adult medical examination without abnormal findings: Secondary | ICD-10-CM

## 2011-01-06 DIAGNOSIS — Z13820 Encounter for screening for osteoporosis: Secondary | ICD-10-CM | POA: Insufficient documentation

## 2011-01-28 ENCOUNTER — Telehealth: Payer: Self-pay | Admitting: Internal Medicine

## 2011-01-29 ENCOUNTER — Encounter: Payer: Self-pay | Admitting: Internal Medicine

## 2011-02-05 NOTE — Progress Notes (Signed)
Summary: ?Reaction to med  ( itching)  Phone Note Call from Patient Call back at Home Phone [phone]   Caller: Patient Call For: Dr Marina Goodell Reason for Call: Talk to Nurse Summary of Call: Patient has questions regarding medication. Initial call taken by: Tawni Levy,  January 28, 2011 1:08 PM  Follow-up for Phone Call        Patient states that she is having some problems with itching. Her dermatologist thinks that it may be related to her Omeprazole. Patient wants to know if Dr. Marina Goodell has any other medication he could suggest so she may stop the omeprazole. Patient states that she had nausea with Prevacid. Dr. Marina Goodell please advise. Follow-up by: Selinda Michaels RN,  January 28, 2011 1:18 PM  Additional Follow-up for Phone Call Additional follow up Details #1::         Who is her dermatologist ? itching with this medication is unusual. Furthermore , she has been on this for quite some time without similar problems. however, there are multiple other PPIs that could be used instead. We can try pantoprazole 40 mg daily , if she would like. Additional Follow-up by: Hilarie Fredrickson MD,  January 28, 2011 1:30 PM    Additional Follow-up for Phone Call Additional follow up Details #2::    Patient is seeing Dr. Karlyn Agee. Informed patient that Omeprazole is not known to cause itching. Offered to call in Pantoprazole per Dr. Marina Goodell and now patient states that she is afraid to try switching to something else. States she will stay on the Omeprazole for now and see if the itching goes away. Follow-up by: Selinda Michaels RN,  January 28, 2011 2:04 PM  Additional Follow-up for Phone Call Additional follow up Details #3:: Details for Additional Follow-up Action Taken:  that's reasonable. Thanks Additional Follow-up by: Hilarie Fredrickson MD,  January 28, 2011 2:13 PM

## 2011-03-03 LAB — POCT HEMOGLOBIN-HEMACUE: Hemoglobin: 13.9 g/dL (ref 12.0–15.0)

## 2011-04-03 ENCOUNTER — Other Ambulatory Visit: Payer: Self-pay | Admitting: Internal Medicine

## 2011-04-03 MED ORDER — OMEPRAZOLE 20 MG PO CPDR: 20.0000 mg | DELAYED_RELEASE_CAPSULE | Freq: Two times a day (BID) | ORAL | Status: DC

## 2011-04-03 NOTE — Telephone Encounter (Signed)
Rx. Sent to pharmacy for #60 only appt. On 04/15/11 with Dr. Marina Goodell.

## 2011-04-14 NOTE — Assessment & Plan Note (Signed)
[facility]                         GASTROENTEROLOGY OFFICE NOTE   NAME:[name], [name] FILYAW                      MRN:          [id number]  DATE:03/19/2008                            DOB:          Mar 30, 1936    REASON FOR CONSULTATION:  Reflux disease, abdominal pain, dysphagia, and  surveillance colonoscopy.   PHYSICIAN REQUESTING CONSULTATION:  DR Chrissie Noa HOPPER   HISTORY OF PRESENT ILLNESS:  This is a pleasant 75 year old white female  with a history of chronic gastroesophageal reflux disease, dyslipidemia,  osteoarthritis, and chronic headaches.  She is sent to the office by Dr.  Alwyn Ren for my opinion regarding the above listed concerns.  First, the  patient reports a 40-year history of problems with indigestion and  heartburn.  This was initially treated with antacids on a regular basis  more recently with proton pump inhibitors.  She apparently underwent an  upper endoscopy in [place], [place], in 1999.  Per Dr. Frederik Pear note,  this was negative.  The patient tells me that on omeprazole 20 mg once  or twice daily classic indigestion and heartburn are well-controlled.  She does, however, have several year history of intermittent solid food  dysphagia to items such as chicken, rice, and pills.  This has worsened  in recent months.  She also reports several year history of intermittent  problems with severe epigastric pain.  The pain has an abrupt onset she  describes as a burning sensation which may double her over.  The pain  does not radiate to the back or upper quadrants.  It generally lasts 30  minutes before abating.  She treats this with antacids.  Symptoms can  occur despite compliance with proton pump inhibitors.  She feels the  frequency and severity of this pain have increased in time.  She denies  nausea, vomiting or weight loss.  She has actually had weight gain.  Finally, she reports history of colon polyps in her colonoscopy in  2002.  No details available.  Laboratories at Dr. Frederik Pear office revealed  normal hemoglobin.  However, iron saturation was low at 9.8%.  Other  laboratories okay except for SGPT 100.  The patient denies lower  abdominal pain, constipation, diarrhea, melena or hematochezia.  No  jaundice, change in urine color or back pain.  She does have occasional  cramping discomfort in the right upper quadrant which does not  necessarily go along with the epigastric pain.   PAST MEDICAL HISTORY:  1. Dyslipidemia.  2. Osteoarthritis.  3. Chronic headache.  4. Chronic reflux disease.   PAST SURGICAL HISTORY:  1. Inguinal hernia repair.  2. Hysterectomy with bilateral salpingo-oophorectomy.   ALLERGIES:  No known drug allergies.   CURRENT MEDICATIONS:  1. Omeprazole 20 mg b.i.d.  2. Also takes nonsteroidal anti-inflammatory drugs p.r.n.   FAMILY HISTORY:  No family history of gastrointestinal malignancy.  Uncle with heart disease, brother with prostate cancer.  Two aunts with  breast cancer.   SOCIAL HISTORY:  The patient is married with three children.  Her son  Rosanne Ashing is an Transport planner in [place].  She lives with her  husband.  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  She does not smoke or use  alcohol.   REVIEW OF SYSTEMS:  Per diagnostic evaluation form.   PHYSICAL EXAMINATION:  GENERAL:  A pleasant well-appearing female in no  acute distress.  She is alert and oriented.  VITAL SIGNS:  Blood pressure 124/78, heart rate 68 and regular,  respirations 18 and regular, weight 152.8 pounds, 5 feet 3 inches.  HEENT:  Sclerae are anicteric, conjunctivae are pink, oral mucosa is  intact.  There are no aphthous ulcerations.  NECK:  Thyroid is normal.  There is no adenopathy.  LUNGS:  Clear to auscultation and percussion.  HEART:  Regular without murmur.  ABDOMEN:  Soft, nontender, nondistended with good bowel sounds.  Liver  span is normal.  Spleen is not palpable.  No mass or  hernia.  RECTAL:  Deferred (until colonoscopy).  EXTREMITIES:  Without clubbing, cyanosis or edema.  NEUROLOGIC:  She is grossly intact without focal deficits.   IMPRESSION:  1. Chronic gastroesophageal reflux disease with worsening intermittent      solid food dysphagia.  Rule out peptic stricture.  2. Intermittent epigastric pain worsening in frequency and severity.      It is not clear to me that this is acid peptic as it occurs despite      regular proton pump inhibitor use.  Rule out occult gallbladder      disease.  3. History of colon polyps on colonoscopy seven years ago.  No      additional details available.  4. Low iron saturation.  Question occult gastrointestinal mucosal      disease.  However, no evidence of anemia and Hemoccult studies all      negative x3.   RECOMMENDATIONS:  1. Colonoscopy to provide polyp surveillance as well as evaluate low      iron saturation.  The nature of the procedure as well as the risks,      benefits, and alternatives have been reviewed.  She understood and      agreed to proceed.  2. Upper endoscopy with esophageal dilation.  The nature of the      procedures as well as the risks, benefits, and alternatives have      been reviewed.  She understood and agreed to proceed.  3. Schedule abdominal ultrasound to evaluate pain, i.e., rule out      gallstones.  4. Ongoing general medical care with Dr. Alwyn Ren.     Wilhemina Bonito. Marina Goodell, MD  Electronically Signed    JNP/MedQ  DD: 03/19/2008  DT: 03/19/2008  Job #: [id number]   cc:   Titus Dubin. Alwyn Ren, MD,FACP,FCCP

## 2011-04-15 ENCOUNTER — Encounter: Payer: Self-pay | Admitting: Internal Medicine

## 2011-04-15 ENCOUNTER — Ambulatory Visit (INDEPENDENT_AMBULATORY_CARE_PROVIDER_SITE_OTHER): Payer: Medicare PPO | Admitting: Internal Medicine

## 2011-04-15 VITALS — BP 128/76 | HR 64 | Ht 64.0 in | Wt 135.0 lb

## 2011-04-15 DIAGNOSIS — Z86010 Personal history of colonic polyps: Secondary | ICD-10-CM

## 2011-04-15 DIAGNOSIS — K222 Esophageal obstruction: Secondary | ICD-10-CM

## 2011-04-15 DIAGNOSIS — K219 Gastro-esophageal reflux disease without esophagitis: Secondary | ICD-10-CM

## 2011-04-15 NOTE — Progress Notes (Signed)
HISTORY OF PRESENT ILLNESS:  [name] [name] is a 75 y.o. female with hyperlipidemia, migraine headaches, and prior lacunar infarct. She is followed in this office for GERD complicated by peptic stricture and colon polyps. Also prior problems with abdominal pain secondary to gallstones for which she underwent cholecystectomy. She has not been seen in 3 years. Her last colonoscopy was in April 2009 as was her upper endoscopy with esophageal dilation. Thereafter she underwent her cholecystectomy. She has not had recurrence of epigastric pain. No active reflux symptoms on PPI. No recurrent dysphagia. She does request a refill of omeprazole. Her primary provider is Dr. Eric Form. She has undergone bone density evaluations. We discussed long term PPI use. No lower GI complaints.  REVIEW OF SYSTEMS:  All non-GI ROS negative except for headaches.  Past Medical History  Diagnosis Date  . Lacunar infarction   . Hyperlipidemia   . History of colonic polyps   . Herniated disc   . Common migraine   . Acid reflux disease   . Diverticulosis   . Esophageal stricture   . Hiatal hernia 2009    EGD-Small     Past Surgical History  Procedure Date  . Total abdominal hysterectomy   . Inguinal hernia repair     rt.    Social History [name] [name]  reports that she has never smoked. She has never used smokeless tobacco. She reports that she does not drink alcohol or use illicit drugs.  family history includes Aneurysm (age of onset:37) in her sister; Breast cancer in her maternal aunt and paternal aunt; and Cancer in her brother.  There is no history of Colon cancer.  Allergies  Allergen Reactions  . Oxycodone Hcl     REACTION: vomitting       PHYSICAL EXAMINATION:  Vital signs: BP 128/76  Pulse 64  Ht 5\' 4"  (1.626 m)  Wt 135 lb (61.236 kg)  BMI 23.17 kg/m2 General: Well-developed, well-nourished, no acute distress HEENT: Sclerae are anicteric, conjunctiva pink. Oral mucosa  intact Lungs: Clear Heart: Regular Abdomen: soft, nontender, nondistended, no obvious ascites, no peritoneal signs, normal bowel sounds. No organomegaly. Extremities: No edema Psychiatric: alert and oriented x3. Cooperative    ASSESSMENT:  #1. GERD complicated by peptic stricture. Asymptomatic post dilation on PPI #2. Symptomatic cholelithiasis. Resolved post cholecystectomy #3. History of colon polyps. [place] 2002 and here 2009 (no tissue).  PLAN:  #1. Continue PPI #2. Continue reflux precautions #3. EGD with dilation when necessary recurrent for recurrent dysphagia #4. Refill omeprazole #5. Routine office followup in 2 years #6. Followup colonoscopy around April 2014

## 2011-04-15 NOTE — Patient Instructions (Signed)
Follow-up as needed. Call when you need refills of your Omeprazole.

## 2011-04-17 NOTE — Letter (Signed)
February 23, 2007    Rene Kocher, M.D.  [address]  [place] [address]   RE:  DHRITHI, RICHE  MRN:  [id number]  /  DOB:  03-16-1936   Dear Dr. Clarisse Gouge:   You are scheduled to see Ms. Jarrells February 24, 2007 at 10:15 to evaluate  headaches.   She was evaluated in July 2005 by Dr. Kelli Hope, who felt that  they were multifactorial.  There is a background of migraine with  suggestion of a component of cervical tension.  He felt the major issue  was caffeine from coffee and Excedrin causing rebound.   PAST HISTORY:  Included a herniated disc in low back in 1990.  She had a  hysterectomy and femoral hernia repair.  In October 2005 a suggestion of  a diagnosis of possible lacunar infarct was made; this diagnosis was  based on a CT finding performed to evaluate the headaches in October  2005.   It was described as a small lacunar infarct on the anterior limb of the  right internal capsule at DRI.  The CT had been ordered because of a  family history of cerebral aneurysm.   She has failed to respond to verapamil and nortriptyline.  Topamax was  stopped because of expense.  Frova was discontinued because of her age  and potential cardiovascular risk in view of dyslipidemia.   Presently she is on calcium, multivitamins, omeprazole.   She has never smoked and does not drink.   PROTONIX  has caused nausea and SEPTRA DS caused cramps.   She has been on Lipitor for the dyslipidemia; because of a history of  lacunar infarct I have encouraged her to keep the LDL below 100.  Most  recently her glucose was 104 and I have asked her to have an A1C  evaluated after dietary changes.   I appreciate your evaluation and recommendation.    Sincerely,      Titus Dubin. Alwyn Ren, MD,FACP,FCCP  Electronically Signed    [facility]/MedQ  DD: 02/23/2007  DT: 02/23/2007  Job #: [id number]

## 2011-04-17 NOTE — Op Note (Signed)
[name] [name], [name] [name]                           ACCOUNT NO.:  [id number]   MEDICAL RECORD NO.:  [id number]                   PATIENT TYPE:  AMB   LOCATION:  DSC                                  FACILITY:  [facility]   PHYSICIAN:  [name] [name], M.D.               DATE OF BIRTH:  21-Jul-1936   DATE OF PROCEDURE:  05/22/2003  DATE OF DISCHARGE:                                 OPERATIVE REPORT   CCS [phone].   PREOPERATIVE DIAGNOSIS:  Right groin hernia.   POSTOPERATIVE DIAGNOSIS:  Right groin hernia (femoral hernia.   OPERATION:  Repair of right femoral hernia with mesh.   SURGEON:  [name] [name], M.D.   ASSISTANT:  Rose Phi. Maple Hudson, M.D.   ANESTHESIA:  General.   ANESTHESIOLOGIST:  Janetta Hora. Gelene Mink, M.D., and C.R.N.A.   PROCEDURE:  The patient was taken to the operating room and placed on the  table in supine position.  The right lower quadrant of the abdomen was  prepped and draped as a sterile field.  An oblique incision was marked with  a skin marker.  Local anesthesia was then given, which consisted of 0.25%  Marcaine without epinephrine, 1% Xylocaine without epinephrine, and sodium  bicarbonate.  This was used to block the ilioinguinal nerve and the area for  the incision.  Later as we proceeded with the case, we opened the peritoneal  cavity and the patient was uncomfortable, and then she was given a general  anesthetic.  The incision was taken through the skin and subcutaneous tissue  with subcutaneous bleeders being ligated with 3-0 Vicryl or cauterized with  Bovie.  The external oblique aponeurosis was divided in the line of its  fibers to and through the external ring.  The patient had a large  ilioinguinal nerve, which was seen and preserved immediately.  Dissection at  the pubic tubercle revealed no evidence of a hernia at this area.  The round  ligament was dissected free and divided distally and the distal end was  suture ligated with 0 chromic catgut.  The  round ligament was dissected back  to the internal ring.  There was no evidence of a direct or indirect hernia.  Looking below the inguinal ligament, there was no obvious hernia at that  point.  The patient was known to have a mass which was medial to the femoral  vessels.  The peritoneum at the internal ring was identified, opened up, and  a finger placed into the peritoneal cavity, and the patient did have a  femoral hernia coming off medial to the femoral vein.  Knowing this, with  very gentle and tedious dissection, the fairly large femoral hernia was  dissected free.  There was a considerable amount of scarring around the  hernia sac.  It was certainly as large as a walnut and perhaps slightly  larger.  This was reduced through the inguinal  ligament.  The hernia sac was  then opened up and a finger placed through the peritoneal cavity to confirm  that this was the femoral hernia.  The excess hernia sac was excised and the  hernia was ligated proximally with a suture ligature of 0 chromic catgut.  Three sutures of 0 Novofil were then placed from the conjoined tendon to  Cooper's ligament to close the femoral defect.  The floor of the inguinal  canal was then imbricated with a running suture of 0 chromic catgut, the  peritoneum of the internal ring was closed with 0 chromic catgut after the  round ligament had been separately ligated with 0 chromic catgut.  Feeling  from below, there appeared to be some slight room between the femoral vein  and the tissues medially.  It was felt that there was no constriction close  to the femoral vein.  It was felt that the closure was adequate.  A piece of  3 x 6 inch Atrium mesh was then fashioned to fill the entire inguinal floor.  The ilioinguinal nerve was placed superficial to this.  The mesh was  anchored medially and superiorly with interrupted sutures of 0 Novofil and  the mesh was anchored inferiorly with a running suture of 2-0 Novofil, with   the first suture at the pubic tubercle, the other sutures in the shelving  edge of Poupart's ligament.  The external oblique aponeurosis was then  approximated with interrupted sutures of 3-0 Vicryl, as was Scarpa's fascia.  Subcutaneous tissue closed with 3-0 Vicryl and the skin was closed with  running subcuticular suture of 4-0 Monocryl.  Benzoin and half-inch Steri-  Strips were used to reinforce the skin closure.  Dry sterile dressing was  applied.  It should be noted that when the ilioinguinal nerve was exposed,  it was blocked directly with the local anesthetic.  The patient tolerated  the procedure well and was taken to the PACU in satisfactory condition.                                               [name] [name], M.D.    TBP/MEDQ  D:  05/22/2003  T:  05/23/2003  Job:  [id number]   cc:   Titus Dubin. Alwyn Ren, M.D. [facility]

## 2011-05-26 ENCOUNTER — Other Ambulatory Visit: Payer: Self-pay | Admitting: Internal Medicine

## 2011-05-27 ENCOUNTER — Other Ambulatory Visit: Payer: Self-pay | Admitting: Internal Medicine

## 2011-05-28 NOTE — Telephone Encounter (Signed)
Rx. Sent to pharmacy for #60.

## 2011-05-28 NOTE — Telephone Encounter (Signed)
Rx Sent to pharmacy.  

## 2012-01-12 ENCOUNTER — Ambulatory Visit (INDEPENDENT_AMBULATORY_CARE_PROVIDER_SITE_OTHER): Payer: Medicare PPO | Admitting: Nurse Practitioner

## 2012-01-12 ENCOUNTER — Encounter: Payer: Self-pay | Admitting: Nurse Practitioner

## 2012-01-12 ENCOUNTER — Ambulatory Visit: Payer: Medicare PPO | Admitting: Nurse Practitioner

## 2012-01-12 VITALS — BP 136/77 | HR 80 | Ht 64.0 in | Wt 149.0 lb

## 2012-01-12 DIAGNOSIS — R51 Headache: Secondary | ICD-10-CM

## 2012-01-12 DIAGNOSIS — R519 Headache, unspecified: Secondary | ICD-10-CM | POA: Insufficient documentation

## 2012-01-12 MED ORDER — ZONISAMIDE 100 MG PO CAPS: 100.0000 mg | ORAL_CAPSULE | Freq: Every day | ORAL | Status: DC

## 2012-01-12 MED ORDER — ACETAMINOPHEN-CODEINE 300-30 MG PO TABS: 1.0000 | ORAL_TABLET | Freq: Four times a day (QID) | ORAL | Status: DC | PRN

## 2012-01-12 MED ORDER — CELECOXIB 100 MG PO CAPS: 100.0000 mg | ORAL_CAPSULE | Freq: Every day | ORAL | Status: DC

## 2012-01-12 NOTE — Progress Notes (Unsigned)
Diagnosis: Chronic daily headache with history of migraine before menopause  History: New patient today for headache evaluation. She has a long history of classic migraine since she was a young woman. After menopause she felt her migraines all but disappeared. Now for the last 25-30 years she has had a daily headache that is described as right sided and not having any other features of migraine. She describes the pain as an ache. The pain can be "8" on 1-10. The headaches often awaken her in the middle of the night between 2:30 and 3 am. She often gets up and takes an Excedrin or goody powder and goes back to sleep. She can develop this same headache during the day if she does not eat. She has been seen at [facility] by Dr Neale Burly who was treating her with trigger point injection and Zonegran. She did feel that the Zonegran was working and her headaches had decreased to 1-2 per month. She stopped taking her medication when her prescription ran out and she did not return to his office for financial reasons. Her sleep has been an issue for years. She falls asleep easily around 10pm but then is awake off and on through out the night. She snores very loudly and her husband can not sleep in the same room and has to close the door between them. She describes nonrestortive sleep.   Location:***  Number of Headache days/month: Severe:*** Moderate:*** Mild:***  Current Outpatient Prescriptions on File Prior to Visit  Medication Sig Dispense Refill  . calcium-vitamin D (CALCIUM 500+D) 500-200 MG-UNIT per tablet Take 1 tablet by mouth daily.        . Misc Natural Products (LUTEIN VISION BLEND) CAPS Take 1 capsule by mouth 2 (two) times daily.        . multivitamin (THERAGRAN) per tablet Take 1 tablet by mouth daily.        Marland Kitchen omeprazole (PRILOSEC) 20 MG capsule TAKE 1 CAPSULE BY MOUTH 2 TIMES DAILY.  60 capsule  11  . hydrOXYzine (ATARAX) 25 MG tablet Take 25 mg by mouth every 8 (eight) hours  as needed.       Marland Kitchen omeprazole (PRILOSEC) 20 MG capsule Take 20 mg by mouth daily.        Marland Kitchen omeprazole (PRILOSEC) 20 MG capsule TAKE 1 CAPSULE BY MOUTH 2 TIMES DAILY.  60 capsule  11  . topiramate (TOPAMAX) 25 MG capsule Take 50 mg by mouth at bedtime.          Acute prevention:***  Past Medical History  Diagnosis Date  . Lacunar infarction   . Hyperlipidemia   . History of colonic polyps   . Herniated disc   . Common migraine   . Acid reflux disease   . Diverticulosis   . Esophageal stricture   . Hiatal hernia 2009    EGD-Small    Past Surgical History  Procedure Date  . Total abdominal hysterectomy   . Inguinal hernia repair     rt.  . Cholecystectomy     4-5 YRS AGO.  Marland Kitchen Eye surgery     BILATERAL CATARAT   Family History  Problem Relation Age of Onset  . Cancer Brother     kidney cancer  . Aneurysm Sister 37    died   . Breast cancer Maternal Aunt   . Breast cancer Paternal Aunt   . Colon cancer Neg Hx    Social History:  reports that she has never smoked. She  has never used smokeless tobacco. She reports that she does not drink alcohol or use illicit drugs. Allergies:  Allergies  Allergen Reactions  . Oxycodone Hcl     REACTION: vomitting    Triggers:***  Birth control:***  ROS:***  Exam:***  General: HEENT: Cardiac: Lungs: Neuro: Skin:  Impression:{headache [id number]:[id number]}  Plan***  Time Spent:***

## 2012-01-12 NOTE — Patient Instructions (Signed)
Headache and Arthritis Headaches and arthritis are common problems. This causes an interest in the possible role of arthritis in causing headaches. Several major forms of arthritis exist. Two of the most common types are:  Rheumatoid arthritis.     Osteoarthritis.  Rheumatoid arthritis may begin at any age. It is a condition in which the body attacks some of its own tissues, thinking they do not belong. This leads to destruction of the bony areas around the joints. This condition may afflict any of the body's joints. It usually produces a deformity of the joint. The hands and fingers no longer appear straight but often appear angled towards one side. In some cases, the spine may be involved. Most often it is the vertebrae of the neck (cervicalspine). The areas of the neck most commonly afflicted by rheumatoid arthritis are the first and second cervical vertebrae. Curiously, rheumatoid arthritis, though it often produces severe deformities, is not always painful.   The more common form of arthritis is osteoarthritis. It is a wear-and-tear form of arthritis. It usually does not produce deformity of the joints or destruction of the bony tissues. Rather the ligaments weaken. They may be calcified due to the body's attempt to heal the damage. The larger joints of the body and those joints that take the most stress and strain are the most often affected. In the neck region this osteoarthritis usually involves the fifth, sixth and seventh vertebrae. This is because the effects of posture produce the most fatigue on them. Osteoarthritis is often more painful than rheumatoid arthritis.   During workups for arthritis, a test evaluating inflammation, (the sedimentation rate) often is performed. In rheumatoid arthritis, this test will usually be elevated. Other tests for inflammation may also be elevated. In patients with osteoarthritis, x-rays of the neck or jaw joints will show changes from "lipping" of the vertebrae.  This is caused by calcium deposits in the ligaments. Or they may show narrowing of the space between the vertebrae, or spur formation (from calcium deposits). If severe, it may cause obstruction of the holes where the nerves pass from the spine to the body. In rheumatoid arthritis, dislocation of vertebrae may occur in the upper neck. CT scan and MRI in patients with osteoarthritis may show bulging of the discs that cushion the vertebrae. In the most severe cases, herniation of the discs may occur.   Headaches, felt as a pain in the neck, may be caused by arthritis if the first, second or third vertebrae are involved. This condition is due to the nerves that supply the scalp only originating from this area of the spine. Neck pain itself, whether alone or coupled with headaches, can involve any portion of the neck. If the jaw is involved, the symptoms are similar to those of Temporomandibular Joint Syndrome (TMJ).   The progressive severity of rheumatoid arthritis may be slowed by a variety of potent medications. In osteoarthritis, its progression is not usually hindered by medication. The following may be helpful in slowing the advancement of the disorder:  Lifestyle adjustment.     Exercise.    Rest.    Weight loss.  Medications, such as the nonsteroidal anti-inflammatory agents (NSAIDs), are useful. They may reduce the pain and improve the reduced motion which occurs in joints afflicted by arthritis. From some studies, the use of acetaminophen appears to be as effective in controlling the pain of arthritis as the NSAIDs. Physical modalities may also be useful for arthritis. They include:  Heat.  Massage.    Exercise.  But physical therapy must be prescribed by a caregiver, just as most medications for arthritis.   Document Released: 02/06/2004 Document Revised: 07/29/2011 Document Reviewed: 07/05/2008 [facility] Patient Information 2012 [place], [place].

## 2012-01-12 NOTE — Progress Notes (Signed)
Patient ID: [name] [name], female   DOB: [birth year], 76 y.o.   MRN: [id number]      Reason for Visit     Headache    New Patient.        Vitals - Last Recorded       BP Pulse Ht Wt BMI    136/77  80  5\' 4"  (1.626 m)  149 lb (67.586 kg)  25.58 kg/m2       Progress Notes     Muhsin Doris, Rubbie Battiest, NP  01/12/2012 11:56 AM  Pended Diagnosis: Chronic daily headache with history of migraine before menopause   History: New patient today for headache evaluation. She has a long history of classic migraine since she was a young woman. After menopause she felt her migraines all but disappeared. Now for the last 25-30 years she has had a daily headache that is described as right sided and not having any other features of migraine. She describes the pain as an ache. The pain can be "8" on 1-10. The headaches often awaken her in the middle of the night between 2:30 and 3 am. She often gets up and takes an Excedrin or goody powder and goes back to sleep. She can develop this same headache during the day if she does not eat. She has been seen at [facility] by Dr Neale Burly who was treating her with trigger point injection and Zonegran. She did feel that the Zonegran was working and her headaches had decreased to 1-2 per month. She stopped taking her medication when her prescription ran out and she did not return to his office for financial reasons. Her sleep has been an issue for years. She falls asleep easily around 10pm but then is awake off and on through out the night. She snores very loudly and her husband can not sleep in the same room and has to close the door between them. She describes nonrestortive sleep and daytime fatigue. In general is very healthy, does not exercise. Denies any health issues other than reflux   Location: Right temple and right neck   Number of Headache days/month: Severe:30 Moderate:0 Mild:0    Current Outpatient Prescriptions on File Prior to Visit     Medication  Sig  Dispense  Refill   .  calcium-vitamin D (CALCIUM 500+D) 500-200 MG-UNIT per tablet  Take 1 tablet by mouth daily.           .  Misc Natural Products (LUTEIN VISION BLEND) CAPS  Take 1 capsule by mouth 2 (two) times daily.           .  multivitamin (THERAGRAN) per tablet  Take 1 tablet by mouth daily.           Marland Kitchen  omeprazole (PRILOSEC) 20 MG capsule  TAKE 1 CAPSULE BY MOUTH 2 TIMES DAILY.   60 capsule   11   .  hydrOXYzine (ATARAX) 25 MG tablet  Take 25 mg by mouth every 8 (eight) hours as needed.          Marland Kitchen  omeprazole (PRILOSEC) 20 MG capsule  Take 20 mg by mouth daily.           Marland Kitchen  omeprazole (PRILOSEC) 20 MG capsule  TAKE 1 CAPSULE BY MOUTH 2 TIMES DAILY.   60 capsule   11   .  topiramate (TOPAMAX) 25 MG capsule  Take 50 mg by mouth at bedtime.  Acute prevention: In past has taken flexeril, zonegran, topamax, bc powders, excedrin    Past Medical History   Diagnosis  Date   .  Lacunar infarction     .  Hyperlipidemia     .  History of colonic polyps     .  Herniated disc     .  Common migraine     .  Acid reflux disease     .  Diverticulosis     .  Esophageal stricture     .  Hiatal hernia  2009       EGD-Small     Past Surgical History   Procedure  Date   .  Total abdominal hysterectomy     .  Inguinal hernia repair         rt.   .  Cholecystectomy         4-5 YRS AGO.   Marland Kitchen  Eye surgery         BILATERAL CATARAT    Family History   Problem  Relation  Age of Onset   .  Cancer  Brother         kidney cancer   .  Aneurysm  Sister  37       died    .  Breast cancer  Maternal Aunt     .  Breast cancer  Paternal Aunt     .  Colon cancer  Neg Hx      Social History: reports that she has never smoked. She has never used smokeless tobacco. She reports that she does not drink alcohol or use illicit drugs. Allergies:   Allergies   Allergen  Reactions   .  Oxycodone Hcl         REACTION: vomitting      Triggers: sleep issues and neck  pain   Birth control: post menopause   WUJ:[id number] for headaches, fatigue, snoring, gastric reflux, neck pain   Exam: well developed well nourished 76 yo caucasian female   General: NAD HEENT: positive for right neck tenderness from cervical regions to trap Cardiac: RRR Lungs: Clear Neuro: Negative Skin: Warm and dry   Impression: Chronic daily headache   Plan:We will do MRI of neck and Sleep study for apnea. After MRI may refer to physical therapy. She has arthritis and will use Celebrex to help with neck pain. She is advised not to use OTC excedrin or goody powders. Will restart Zonegran as she has done well with this in past. She may use flexeril as needed and she had prescription. She will be given tylenol # 3 to take one as needed for acute pain. RTC 4 weeks   Time Spent: 90 minutes

## 2012-01-31 ENCOUNTER — Ambulatory Visit (HOSPITAL_BASED_OUTPATIENT_CLINIC_OR_DEPARTMENT_OTHER): Payer: Medicare PPO | Attending: Nurse Practitioner | Admitting: General Practice

## 2012-01-31 VITALS — Ht 64.0 in | Wt 145.0 lb

## 2012-01-31 DIAGNOSIS — G4733 Obstructive sleep apnea (adult) (pediatric): Secondary | ICD-10-CM

## 2012-01-31 DIAGNOSIS — G4700 Insomnia, unspecified: Secondary | ICD-10-CM

## 2012-02-06 DIAGNOSIS — G4733 Obstructive sleep apnea (adult) (pediatric): Secondary | ICD-10-CM

## 2012-02-07 NOTE — Procedures (Signed)
[name] [name], [name] [name]               ACCOUNT NO.:  [id number]  MEDICAL RECORD NO.:  [id number]          PATIENT TYPE:  OUT  LOCATION:  SLEEP CENTER                 FACILITY:  [facility]  PHYSICIAN:  Clinton D. Maple Hudson, MD, FCCP, FACPDATE OF BIRTH:  10[birth year]  DATE OF STUDY:  01/31/2012                           NOCTURNAL POLYSOMNOGRAM  REFERRING PHYSICIAN:  LINDA MILLER BAREFOOT  INDICATION FOR STUDY:  Insomnia with sleep apnea.  EPWORTH SLEEPINESS SCORE:  6/24.  BMI 24.9, weight 145 pounds, height 64 inches, neck 12 inches.  HOME MEDICATIONS:  Charted and reviewed.  SLEEP ARCHITECTURE:  Total sleep time 252 minutes with sleep efficiency 63.1%.  Stage I was 13.3%, stage II 69.8%, stage III absent, REM 16.9% of total sleep time.  Sleep latency 53.5 minutes.  REM latency 84.5 minutes.  Awake after sleep onset 95 minutes.  Arousal index 25. Bedtime medication:  None.  RESPIRATORY DATA:  Apnea/hypopnea index (AHI) 26.7 per hour.  A total of 112 events was scored including 65 obstructive apneas, 1 central apnea, 46 hypopneas.  Events were associated with nonsupine sleep position. REM AHI 39.5 per hour.  This was a diagnostic NPSG protocol as ordered, and CPAP titration was not attempted.  OXYGEN DATA:  Loud snoring with oxygen desaturation to a nadir of 70% and mean oxygen saturation through the study of 93.4% on room air.  A total of 17 minutes was recorded during the total recording time with room air oxygen saturation less than 90%.  CARDIAC DATA:  Sinus rhythm with rare PVC.  MOVEMENT/PARASOMNIA:  A few incidental limb jerks were noted without sleep disturbance.  Bathroom x1.  IMPRESSION/RECOMMENDATION: 1. Moderate obstructive sleep apnea/hypopnea syndrome, AHI 26.7 per     hour with nonsupine events, loud snoring, and oxygen desaturation     to a nadir of 70% with mean oxygen saturation of 93.4% on room air.     17 minutes were recorded with room air saturation less than  90%. 2. Consider return for dedicated CPAP titration study or evaluate for     alternative management as clinically appropriate.     Clinton D. Maple Hudson, MD, [facility], FACP Diplomate, American Board of Sleep Medicine    CDY/MEDQ  D:  02/06/2012 11:35:01  T:  02/07/2012 03:50:51  Job:  [id number]

## 2012-02-16 ENCOUNTER — Ambulatory Visit (INDEPENDENT_AMBULATORY_CARE_PROVIDER_SITE_OTHER): Payer: Medicare PPO | Admitting: Nurse Practitioner

## 2012-02-16 ENCOUNTER — Encounter: Payer: Self-pay | Admitting: Nurse Practitioner

## 2012-02-16 VITALS — BP 137/80 | HR 89 | Ht 64.0 in | Wt 149.0 lb

## 2012-02-16 DIAGNOSIS — G4730 Sleep apnea, unspecified: Secondary | ICD-10-CM

## 2012-02-16 NOTE — Patient Instructions (Signed)
Sleep Apnea Sleep apnea is a common disorder. The main problem of this disorder is excessive daytime sleepiness and compromised quality of life. This may include social and emotional problems. There are two types of sleep apnea.  Obstructive sleep apnea is when breathing stops due to a blocked airway.   Central sleep apnea is a malfunction of the brain's normal signal to breathe.  SYMPTOMS  Restless sleep.   Falling asleep while driving and/or during the day.   Loss of energy.   Irritability.   Mood or behavior changes.   Loud, heavy snoring.   Morning headaches.   Trouble concentrating.   Forgetfulness.   Anxiety or depression.   Decreased interest in sex.  Not all people with sleep apnea have all of these symptoms. However, people who have a few of these symptoms should visit their caregiver for an evaluation. Problems related to untreated sleep apnea include:  High blood pressure (hypertension).   Coronary artery disease.   Impotence.   Cognitive dysfunction.   Memory loss.  TREATMENT  For mild cases, treatment may include avoiding sleeping on one's back.   For people with nasal congestion, a decongestant may be prescribed.   Patients with obstructive and central apnea should avoid depressants. This includes alcohol, sedatives and narcotics. Weight loss and diet control are encouraged for overweight patients.   Many serious cases of obstructive sleep apnea can be relieved by a treatment called nasal continuous positive airway pressure (nasal CPAP). Nasal CPAP uses a mask-like device and pump that work together to keep the airway open. The pump delivers air pressure during each breath.   Surgery may help some patients by stopping or reducing the narrowing of the airway due to anatomical defects.  PROGNOSIS  Removing the obstruction usually reverses hypertension and cardiac problems. Untreated, sleep apnea sufferers have a tendency to fall asleep during the day.  This is can result in serious accident or loss of ones job. RESEARCH Sleep apnea is currently one of the most active areas of sleep research.  Document Released: 11/06/2002 Document Revised: 11/05/2011 Document Reviewed: 03/04/2006 [employer] Patient Information 2012 [employer]. 

## 2012-02-16 NOTE — Progress Notes (Signed)
S: Pt returns today for follow up on headaches. She has had Sleep Apnea study done. Results showed Moderate sleep apnea with hypopnea syndrome. She has loud snoring, O2 desat to 70 % at times and average of 93% saturation. They recommend CPAP titration. She does feel her celebrex has help with neck and head pain. There was some confusion with MRI of neck and it has not been scheduled. She took the Zonegran for 1-2 weeks and did not notice any difference so stopped. Flexeril made her very tired and dry mouthed even at 1/4 tab. Does not want different muscle relaxer today  A: Chronic daily headaches Moderate sleep apnea with hypopnea syndrome Arthritis  P: We had a lengthy discussion today and decided to hold on the MRI until CPAP titration is complete. We will schedule CPAP ASAP. I thought she should stay with Zonegran at least 6 weeks to see if it will work as headache prevention. Continue with Celebrex daily. RTC one month or prn

## 2012-02-16 NOTE — Progress Notes (Signed)
Here for headache follow up.  Tylenol with codeine did not help headache and gave her terrible nightmares.

## 2012-02-16 NOTE — Progress Notes (Signed)
Addended by: Patryce Depriest, Bonita Quin M on: 02/16/2012 05:00 PM   Modules accepted: Orders

## 2012-03-08 ENCOUNTER — Ambulatory Visit (HOSPITAL_BASED_OUTPATIENT_CLINIC_OR_DEPARTMENT_OTHER): Payer: Medicare PPO | Attending: Nurse Practitioner | Admitting: General Practice

## 2012-03-08 VITALS — Ht 64.0 in | Wt 145.0 lb

## 2012-03-08 DIAGNOSIS — G4710 Hypersomnia, unspecified: Secondary | ICD-10-CM | POA: Insufficient documentation

## 2012-03-08 DIAGNOSIS — G4733 Obstructive sleep apnea (adult) (pediatric): Secondary | ICD-10-CM

## 2012-03-09 ENCOUNTER — Encounter (HOSPITAL_BASED_OUTPATIENT_CLINIC_OR_DEPARTMENT_OTHER): Payer: Medicare PPO

## 2012-03-13 DIAGNOSIS — G4710 Hypersomnia, unspecified: Secondary | ICD-10-CM

## 2012-03-13 DIAGNOSIS — G4730 Sleep apnea, unspecified: Secondary | ICD-10-CM

## 2012-03-13 NOTE — Procedures (Signed)
NAMEMCKENIZE, MEZERA               ACCOUNT NO.:  [id number]  MEDICAL RECORD NO.:  [id number]          PATIENT TYPE:  OUT  LOCATION:  SLEEP CENTER                 FACILITY:  [facility]  PHYSICIAN:  Shley Dolby D. Maple Hudson, MD, FCCP, FACPDATE OF BIRTH:  10-22[birth year]1938  DATE OF STUDY:  03/08/2012                           NOCTURNAL POLYSOMNOGRAM  REFERRING PHYSICIAN:  Dr. Jannifer Rodney  REFERRING PHYSICIAN:  Dr. Jannifer Rodney.  INDICATION FOR STUDY:  Hypersomnia with sleep apnea.  EPWORTH SLEEPINESS SCORE:  6/24.  BMI 24.9, weight 145 pounds, height 64 inches, neck 12 inches.  MEDICATIONS:  Charted and reviewed.  A baseline diagnostic NPSG on January 31, 2012, documented an AHI of 26.7 per hour.  CPAP titration is requested.  SLEEP ARCHITECTURE:  Total sleep time 238 minutes with sleep efficiency 55.2%.  Stage I was 5%, stage II 81.7%, stage III absent, REM 13.2% of total sleep time.  Sleep latency 70.5 minutes, REM latency 72 minutes, awake after sleep onset 123.5 minutes, arousal index 7.6.  She was awake between 3 and 4:15 a.m.  RESPIRATORY DATA:  CPAP titration protocol.  CPAP was titrated to 7 CWP, AHI 1.2 per hour.  She wore a Banker Pilairo mask (1 size fits all) with heated humidifier and a C-flex setting of 3.  OXYGEN DATA:  Snoring was prevented by CPAP and mean oxygen saturation held 94.1% on room air.  CARDIAC DATA:  Normal sinus rhythm.  MOVEMENT-PARASOMNIA:  No significant movement disturbance.  No bathroom trips.  IMPRESSIONS-RECOMMENDATIONS: 1. Successful continuous positive airway pressure titration to 7     centimeters of water pressure, apnea/hypopnea index 1.2 per hour.     She wore a Banker Pilairo mask (1 size fits all) with     heated humidifier and a C-flex setting of 3.  Snoring was prevented     and mean oxygen saturation held 94.1% on room air. 2. Baseline diagnostic nocturnal polysomnogram on January 31, 2012,     recorded apnea/hypopnea index  26.7 per hour. 3. On the present study, she was awake from 3-4:15 a.m.  Consider if a     sleep medication would be appropriate while helping her adjust to     CPAP in the home environment.     Prithvi Kooi D. Maple Hudson, MD, [facility], FACP Diplomate, American Board of Sleep Medicine    CDY/MEDQ  D:  03/13/2012 08:32:13  T:  03/13/2012 09:05:44  Job:  [id number]

## 2012-03-15 ENCOUNTER — Encounter: Payer: Self-pay | Admitting: Nurse Practitioner

## 2012-03-15 ENCOUNTER — Ambulatory Visit (INDEPENDENT_AMBULATORY_CARE_PROVIDER_SITE_OTHER): Payer: Medicare PPO | Admitting: Nurse Practitioner

## 2012-03-15 ENCOUNTER — Telehealth: Payer: Self-pay

## 2012-03-15 VITALS — BP 132/75 | HR 77 | Ht 64.0 in | Wt 148.0 lb

## 2012-03-15 DIAGNOSIS — G4700 Insomnia, unspecified: Secondary | ICD-10-CM

## 2012-03-15 MED ORDER — ESZOPICLONE 1 MG PO TABS: 1.0000 mg | ORAL_TABLET | Freq: Every day | ORAL | Status: DC

## 2012-03-15 NOTE — Telephone Encounter (Signed)
PATIENT CAME IN TODAY AND LEFT HER LUNESTA PRESCRIPTION BEHIND. I CALLED IT IN TO HER [facility]. I CALLED PATIENT TO INFORM HER ABOUT IT.

## 2012-03-15 NOTE — Patient Instructions (Signed)

## 2012-03-15 NOTE — Progress Notes (Signed)
S: [name] [name] came to office today for CPAP results and Rx for mask. Dr Maple Hudson recommended she have a sleep aid until she was able to tolerate the mask at night. Otherwise the night she wore the CPAP in testing she did not have a headache and she felt very well the next day. She is very hopeful that the CPAP will help her headaches.  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 We are using low dose due to her age. She is advised to take care at night preventing falls.  Given RX for CPAP at 7cm H2O with Flex of 3. Pt mask of choice RTC when she feels improved enough to wean off Zonegran

## 2012-04-04 ENCOUNTER — Telehealth: Payer: Self-pay | Admitting: *Deleted

## 2012-04-04 NOTE — Telephone Encounter (Signed)
Patient is needs [place] to call in her [name] [name] to her pharmacy so she can get the free trial.

## 2012-05-03 ENCOUNTER — Other Ambulatory Visit (HOSPITAL_COMMUNITY): Payer: Self-pay | Admitting: Internal Medicine

## 2012-05-03 DIAGNOSIS — R1900 Intra-abdominal and pelvic swelling, mass and lump, unspecified site: Secondary | ICD-10-CM

## 2012-05-05 ENCOUNTER — Ambulatory Visit (HOSPITAL_COMMUNITY)
Admission: RE | Admit: 2012-05-05 | Discharge: 2012-05-05 | Disposition: A | Discharge status: Home / Self Care | Payer: Medicare PPO | Source: Ambulatory Visit | Attending: Internal Medicine | Admitting: Internal Medicine

## 2012-05-05 DIAGNOSIS — Z90710 Acquired absence of both cervix and uterus: Secondary | ICD-10-CM | POA: Insufficient documentation

## 2012-05-05 DIAGNOSIS — N7013 Chronic salpingitis and oophoritis: Secondary | ICD-10-CM | POA: Insufficient documentation

## 2012-05-05 DIAGNOSIS — R1900 Intra-abdominal and pelvic swelling, mass and lump, unspecified site: Secondary | ICD-10-CM | POA: Insufficient documentation

## 2012-05-06 ENCOUNTER — Other Ambulatory Visit: Payer: Self-pay | Admitting: Internal Medicine

## 2012-05-06 DIAGNOSIS — N898 Other specified noninflammatory disorders of vagina: Secondary | ICD-10-CM

## 2012-05-10 ENCOUNTER — Ambulatory Visit
Admission: RE | Admit: 2012-05-10 | Discharge: 2012-05-10 | Disposition: A | Discharge status: Home / Self Care | Payer: Medicare PPO | Source: Ambulatory Visit | Attending: Internal Medicine | Admitting: Internal Medicine

## 2012-05-10 DIAGNOSIS — N898 Other specified noninflammatory disorders of vagina: Secondary | ICD-10-CM

## 2012-05-10 MED ORDER — IOHEXOL 300 MG/ML  SOLN
100.0000 mL | Freq: Once | INTRAMUSCULAR | Status: AC | PRN
  Administered 2012-05-10: 100 mL via INTRAVENOUS

## 2012-06-16 ENCOUNTER — Other Ambulatory Visit: Payer: Self-pay | Admitting: Nurse Practitioner

## 2012-06-21 ENCOUNTER — Telehealth: Payer: Self-pay | Admitting: Nurse Practitioner

## 2012-06-21 ENCOUNTER — Other Ambulatory Visit: Payer: Self-pay | Admitting: Nurse Practitioner

## 2012-06-21 NOTE — Telephone Encounter (Signed)
[name] [name] has her pharmacy send over a refill request for her Celebrex.  If this has not already been refilled she wanted to know if you could switch it to something less expensive.

## 2012-06-21 NOTE — Telephone Encounter (Signed)
Left message for patient to call back to schedule follow-up appointment with [facility].

## 2012-06-21 NOTE — Telephone Encounter (Signed)
I would like to see her back in office

## 2012-06-27 ENCOUNTER — Other Ambulatory Visit: Payer: Self-pay | Admitting: Internal Medicine

## 2012-06-28 ENCOUNTER — Ambulatory Visit (INDEPENDENT_AMBULATORY_CARE_PROVIDER_SITE_OTHER): Payer: Medicare PPO | Admitting: Nurse Practitioner

## 2012-06-28 ENCOUNTER — Encounter: Payer: Self-pay | Admitting: Nurse Practitioner

## 2012-06-28 VITALS — BP 128/86 | HR 88 | Ht 64.0 in | Wt 151.0 lb

## 2012-06-28 DIAGNOSIS — R519 Headache, unspecified: Secondary | ICD-10-CM

## 2012-06-28 DIAGNOSIS — M1990 Unspecified osteoarthritis, unspecified site: Secondary | ICD-10-CM

## 2012-06-28 DIAGNOSIS — R51 Headache: Secondary | ICD-10-CM

## 2012-06-28 DIAGNOSIS — M129 Arthropathy, unspecified: Secondary | ICD-10-CM

## 2012-06-28 MED ORDER — ZONISAMIDE 100 MG PO CAPS: 100.0000 mg | ORAL_CAPSULE | Freq: Every day | ORAL | Status: DC

## 2012-06-28 MED ORDER — ORPHENADRINE CITRATE ER 100 MG PO TB12: 100.0000 mg | ORAL_TABLET | Freq: Two times a day (BID) | ORAL | Status: DC

## 2012-06-28 MED ORDER — CELECOXIB 100 MG PO CAPS: 100.0000 mg | ORAL_CAPSULE | Freq: Every day | ORAL | Status: DC

## 2012-06-28 NOTE — Patient Instructions (Signed)

## 2012-06-28 NOTE — Progress Notes (Signed)
S: Pt returns today for follow up on chronic daily headaches. She is doing well with CPAP machine and uses it almost every night. She is no longer waking with crushing headache. Her headache pattern has changed to more afternoon and is usually mild and controlled with OTC BC/ Excedrin. She does continue to have headache 4/7 days. She did stop taking the Zonegran a few months ago and is not sure why. She also stopped taking the Celebrex due to cost a few weeks ago and feels like stopping both of these has made headache worse. She would like to have something to take when she gets a headache other than [facility] which she feels is upsetting her stomach.  O: Alert, oriented, somewhat scattered affect Skin warm and dry  A: Sleep Apnea Arthritis Chronic headache  P: We discussed her going back on her preventatives including Celebrex and Zonegran. She will be given Norflex to take when she gets a headache. Continue CPAP and RTC 6 weeks or PRN

## 2012-07-12 ENCOUNTER — Telehealth: Payer: Self-pay

## 2012-07-12 NOTE — Telephone Encounter (Signed)
This patient needs her Noroflex preauthorize through her [facility]. Patient called to follow up.

## 2012-07-13 NOTE — Telephone Encounter (Signed)
Spoke with Hassell Done from patient insurance. Prior auth patient Rx. Noroflex. Will hear from insurance within 24 hours.  Insurance # [id number] Ref # [id number]

## 2012-07-14 ENCOUNTER — Telehealth: Payer: Self-pay

## 2012-07-14 NOTE — Telephone Encounter (Signed)
Patient called regarding the medicine Bonita Quin gave his wife for muscle spasm. His insurance required pre authorization to get it. Durward Mallard called to preauthorize the medicine. Human denied coverage due to the fact that it didn't meet the medical necessity guidelines. Since it's considered a hight risk medication patient must meet the criteria requested. Bonita Quin approved to try something different, she asked me to call in Zanaflex 4mg , take 1/2 tab every 8 hours as needed for muscle spasm. I called it in to [facility]. Pharmacy will call back if there is any problems. I called patient to inform them of the change.

## 2012-08-02 ENCOUNTER — Other Ambulatory Visit: Payer: Self-pay | Admitting: Nurse Practitioner

## 2012-08-09 ENCOUNTER — Ambulatory Visit: Payer: Medicare PPO | Admitting: Nurse Practitioner

## 2012-12-14 ENCOUNTER — Ambulatory Visit (INDEPENDENT_AMBULATORY_CARE_PROVIDER_SITE_OTHER): Payer: Medicare PPO | Admitting: Obstetrics & Gynecology

## 2012-12-14 ENCOUNTER — Encounter: Payer: Self-pay | Admitting: Obstetrics & Gynecology

## 2012-12-14 VITALS — BP 142/84 | HR 83 | Resp 16 | Ht 64.0 in | Wt 151.0 lb

## 2012-12-14 DIAGNOSIS — Z0000 Encounter for general adult medical examination without abnormal findings: Secondary | ICD-10-CM

## 2012-12-14 DIAGNOSIS — Z1231 Encounter for screening mammogram for malignant neoplasm of breast: Secondary | ICD-10-CM

## 2012-12-14 DIAGNOSIS — N816 Rectocele: Secondary | ICD-10-CM

## 2012-12-14 NOTE — Patient Instructions (Signed)
Rectocele/Enterocele Care After A woman's birth canal (vagina) can become weak or stretched. This can be caused by childbirth, heavy lifting, lasting (chronic) constipation, aging, or pelvic surgery. When the vagina is weak and stretched, parts of the intestine can bulge into the vagina by pushing against the vaginal walls. A rectocele is when the very end of the large intestine (rectum) causes the bulge. An enterocele is when the small intestine causes the bulge. Surgery to fix this problem is usually done through the vagina. If you just had this surgery, you were probably given a drug to make you sleep (general anesthetic) or a drug that numbs you from the waist down (spinal/epidural). Here is what happened:  The small intestine or rectum was pushed back to its normal place.  The vaginal wall was made stronger. Sometimes this is done with stitches or a mesh-like material. HOME CARE INSTRUCTIONS  Some women go home the same day as their surgery. Others stay in the hospital for a few days. This depends on the size and type of repair.  Pain and Medications  Some pain is normal after this surgery. Only take pain medicine your surgeon prescribed. Follow the directions carefully.  Do not take aspirin. It can cause bleeding.  Do not drink alcohol while taking pain medication.  You may be given a medicine (antibiotic) that kills germs. Follow the directions carefully.  Take warm sitz baths 2 times a day to control discomfort and reduce any swelling. Take sitz baths with your caregiver's permission. Diet  Go back to your normal eating as directed by your caregiver.  Drink a lot of fluids. Drink at least 6 glasses of water every day. Activity  Move around and walk as much as possible. This can keep blood clots from forming in your legs.  Do not climb stairs until your caregiver says it is okay.  Do not lift objects 5 pounds (2.3 kg) or heavier. Do not bend or strain for 6 to 8 weeks.  Do  not drive until after you stop taking pain medicine and your caregiver says it is okay.  Your return to work will depend on the type of work you do. Ask your caregiver what is best for you.  Ask your caregiver when you can resume sexual activity. Most women can start having sex in about 6 weeks after their surgery.  Get plenty of rest during the day and sleep at night.  Have someone help you with your household chores and activities for 3 to 4 weeks. Other Precautions  You may have some discharge from the vagina for a few weeks after the surgery. It may have small amounts of blood in it. This is normal. If you have questions, ask your caregiver.  Do not use tampons or douche.  You should be able to take a shower a day after your surgery. Do not take a tub bath for at least a week.  Take it easy for awhile. You should feel much better in 2 to 3 weeks. It may take up to 6 weeks to feel completely normal.  Keep all follow-up appointments.  Take your temperature twice a day and write it down.  Make sure your family understands everything about your surgery and recovery. SEEK MEDICAL CARE IF:   You have any questions about your medication, or you need stronger pain medication.  Pain continues, even after taking pain medication.  You become constipated.  You have an oral temperature above 102 F (38.9 C).  You develop swelling and redness in the surgery area.  You become dizzy or lightheaded.  You feel sick to your stomach (nauseous), throw up (vomit), or have diarrhea.  You develop a rash.  You have a reaction to your medications. SEEK IMMEDIATE MEDICAL CARE IF:   Pain gets worse.  You have new bleeding from your vagina.  Discharge from the vagina becomes heavy, or it has a bad smell.  You have an oral temperature above 102 F (38.9 C), not controlled by medicine.  You develop belly (abdominal) pain.  You develop chest pain.  You develop shortness of  breath.  You pass out (faint).  You develop pain, swelling, or redness in the leg.  You have pain or burning with urination.  You have bloody urine or cannot urinate. MAKE SURE YOU:   Understand these instructions.  Will watch your condition.  Will get help right away if you are not doing well or get worse. Document Released: 02/10/2010 Document Revised: 02/08/2012 Document Reviewed: 02/10/2010 [facility] Patient Information 2013 [place], [place].

## 2012-12-14 NOTE — Progress Notes (Signed)
  Subjective:    Patient ID: RHETTA CLEEK, female    DOB: [birth year], 77 y.o.   MRN: [id number]  HPI  Mrs. Millikin is a very pleasant 77 yo MW abstinent lady who has symptomatic prolapse. She recently started on estrace vaginal cream QOD. She wants surgery and not a pessary. She denies urinary or fecal incontinence.  Review of Systems     Objective:   Physical Exam  Mild vaginal vault prolapse 4th degree rectocele Mild cystocele Mild atrophy I placed a #3 ring pessary and this relieved her symptoms but she does not want to deal with a pessary and prefers surgery.    Assessment & Plan:   Symptomatic rectocele I will schedule her for a posterior repair

## 2012-12-20 ENCOUNTER — Ambulatory Visit (INDEPENDENT_AMBULATORY_CARE_PROVIDER_SITE_OTHER): Payer: Medicare PPO | Admitting: Nurse Practitioner

## 2012-12-20 ENCOUNTER — Encounter: Payer: Self-pay | Admitting: Nurse Practitioner

## 2012-12-20 VITALS — BP 139/87 | HR 91 | Ht 64.0 in | Wt 152.0 lb

## 2012-12-20 DIAGNOSIS — G4730 Sleep apnea, unspecified: Secondary | ICD-10-CM

## 2012-12-20 DIAGNOSIS — G43909 Migraine, unspecified, not intractable, without status migrainosus: Secondary | ICD-10-CM

## 2012-12-20 DIAGNOSIS — M62838 Other muscle spasm: Secondary | ICD-10-CM

## 2012-12-20 NOTE — Progress Notes (Signed)
S: Pt returns today for follow up on chronic migraines, muscle spasm in her neck and sleep apnea. She was last seen in July. She is using her apnea machine 2-3/7 nights. She has some problems with dryness and will call the apnea office to discuss that issue. She takes the Zanaflex 2mg  at [facility] and thinks that works well for her. She would like to consider Botox for her muscle spasm in her neck and migraine headaches that can last 4-24 hours.   O: Alert, oriented, NAD Muscle spasm in temples and traps are like rocks Cardiac:RRR Lungs: Clear  A: Migraine Muscle spasm Sleep Apnea  P: She is asked to take the Zanaflex during the day as she can tolerate. We will get Botox pre authorization and schedule her asap for Botox injections.

## 2012-12-20 NOTE — Patient Instructions (Signed)
Migraine Headache A migraine headache is an intense, throbbing pain on one or both sides of your head. A migraine can last for 30 minutes to several hours. CAUSES  The exact cause of a migraine headache is not always known. However, a migraine may be caused when nerves in the brain become irritated and release chemicals that cause inflammation. This causes pain. SYMPTOMS  Pain on one or both sides of your head.  Pulsating or throbbing pain.  Severe pain that prevents daily activities.  Pain that is aggravated by any physical activity.  Nausea, vomiting, or both.  Dizziness.  Pain with exposure to bright lights, loud noises, or activity.  General sensitivity to bright lights, loud noises, or smells. Before you get a migraine, you may get warning signs that a migraine is coming (aura). An aura may include:  Seeing flashing lights.  Seeing bright spots, halos, or zig-zag lines.  Having tunnel vision or blurred vision.  Having feelings of numbness or tingling.  Having trouble talking.  Having muscle weakness. MIGRAINE TRIGGERS  Alcohol.  Smoking.  Stress.  Menstruation.  Aged cheeses.  Foods or drinks that contain nitrates, glutamate, aspartame, or tyramine.  Lack of sleep.  Chocolate.  Caffeine.  Hunger.  Physical exertion.  Fatigue.  Medicines used to treat chest pain (nitroglycerine), birth control pills, estrogen, and some blood pressure medicines. DIAGNOSIS  A migraine headache is often diagnosed based on:  Symptoms.  Physical examination.  A CT scan or MRI of your head. TREATMENT Medicines may be given for pain and nausea. Medicines can also be given to help prevent recurrent migraines.  HOME CARE INSTRUCTIONS  Only take over-the-counter or prescription medicines for pain or discomfort as directed by your caregiver. The use of long-term narcotics is not recommended.  Lie down in a dark, quiet room when you have a migraine.  Keep a journal  to find out what may trigger your migraine headaches. For example, write down:  What you eat and drink.  How much sleep you get.  Any change to your diet or medicines.  Limit alcohol consumption.  Quit smoking if you smoke.  Get 7 to 9 hours of sleep, or as recommended by your caregiver.  Limit stress.  Keep lights dim if bright lights bother you and make your migraines worse. SEEK IMMEDIATE MEDICAL CARE IF:   Your migraine becomes severe.  You have a fever.  You have a stiff neck.  You have vision loss.  You have muscular weakness or loss of muscle control.  You start losing your balance or have trouble walking.  You feel faint or pass out.  You have severe symptoms that are different from your first symptoms. MAKE SURE YOU:   Understand these instructions.  Will watch your condition.  Will get help right away if you are not doing well or get worse. Document Released: 11/16/2005 Document Revised: 02/08/2012 Document Reviewed: 11/06/2011 [employer] Patient Information 2013 [employer].  

## 2012-12-22 ENCOUNTER — Ambulatory Visit (HOSPITAL_COMMUNITY)
Admission: RE | Admit: 2012-12-22 | Discharge: 2012-12-22 | Disposition: A | Discharge status: Home / Self Care | Payer: Medicare PPO | Source: Ambulatory Visit | Attending: Obstetrics & Gynecology | Admitting: Obstetrics & Gynecology

## 2012-12-22 DIAGNOSIS — Z1231 Encounter for screening mammogram for malignant neoplasm of breast: Secondary | ICD-10-CM | POA: Insufficient documentation

## 2012-12-22 DIAGNOSIS — Z0000 Encounter for general adult medical examination without abnormal findings: Secondary | ICD-10-CM

## 2012-12-27 ENCOUNTER — Encounter: Payer: Self-pay | Admitting: Nurse Practitioner

## 2012-12-27 ENCOUNTER — Ambulatory Visit (INDEPENDENT_AMBULATORY_CARE_PROVIDER_SITE_OTHER): Payer: Medicare PPO | Admitting: Nurse Practitioner

## 2012-12-27 VITALS — BP 146/82 | HR 88 | Ht 64.5 in | Wt 153.0 lb

## 2012-12-27 DIAGNOSIS — M62838 Other muscle spasm: Secondary | ICD-10-CM

## 2012-12-27 DIAGNOSIS — G43909 Migraine, unspecified, not intractable, without status migrainosus: Secondary | ICD-10-CM

## 2012-12-27 NOTE — Patient Instructions (Signed)
Botox Cosmetic Injections Botox Cosmetic (botulinum toxin type A) is a purified, very weak (diluted) form of botulinum toxin. This toxin is produced by a bacteria called Clostridium botulinum. The toxin works by blocking nerve impulses to muscles.  Botox Cosmetic may be used as a non-surgical method to soften facial lines and wrinkles. The toxin weakens the muscle, which reduces the lines of facial expression. It is most effective for:  Forehead lines.  Crow's feet.  Frown lines.  Neck wrinkles. YOU SHOULD NOT RECEIVE BOTOX INJECTIONS IF YOU:   Have myasthenia gravis.  Have allergies to human albumin.  Are pregnant or breastfeeding.  Have taken aspirin or anti-inflammatory medicine in the last 2 weeks.  Have neuromuscular disease.  Have allergies to botulinum toxin.  Have had beer, wine, or any other alcohol in the last week. RISKS AND COMPLICATIONS   Soreness.  Small bruises may develop around the injection sites.  Rarely, drooping of the eyebrow or eyelid can occur. This may last 3 to 6 months but is reversible.  Rarely, double vision develops. This can last 3 to 6 months but is usually reversible.  Rarely, changes in voice or speech, loss of the ability to close the eyes, and difficulty breathing may occur. These side effects can occur as early as several hours after injections, or they may be delayed up to several weeks. BEFORE THE PROCEDURE   Do not drink alcohol for 1 week prior to treatment.  Do not take anti-inflammatory medicines or aspirin for 2 weeks before treatment. Ask your caregiver whether you should stop taking blood thinners (anticoagulants) before treatment.  Ask your caregiver about changing or stopping your regular medicines. PROCEDURE Your caregiver will use a thin needle to inject the Botox into your skin or muscles. This takes about 15 minutes. AFTER THE PROCEDURE   It may take 5 to 14 days before softening of the facial lines in the  treated areas begins.  A second injection is sometimes needed if the first treatment is not entirely successful.  Results last about 4 to 6 months. Injections can be repeated every 4 to 6 months. There are no known long-term side effects. Document Released: 08/13/2004 Document Revised: 02/08/2012 Document Reviewed: 03/24/2011 [facility] Patient Information 2013 [place], [place].

## 2012-12-27 NOTE — Progress Notes (Addendum)
S: Pt is in office today for first Botox injection for long time history of migraine. She describes pain in temples and traps bilateral. She has had multiple rounds of trigger point injections which have only been helpful temporarily. She has been taking the Zanaflex which helps.   O: Muscles are tender at temples. Trap are very tight and painful.  Procedure: See Procedure Note Botox 100 units in the usual Botox pattern. Pt tolerated procedure very well.  Lot # [id number] April 2016  A: Migraine Muscle spasm  P: Botox 100 units today in the usual pattern/ see procedure note RTC 3 months Avoid massage Aware will not see any effect for at least one week. May have some site pain, may use ice.

## 2013-01-03 ENCOUNTER — Telehealth: Payer: Self-pay | Admitting: *Deleted

## 2013-01-03 DIAGNOSIS — G43909 Migraine, unspecified, not intractable, without status migrainosus: Secondary | ICD-10-CM

## 2013-01-03 MED ORDER — PREDNISONE 10 MG PO TABS: ORAL_TABLET | ORAL | Status: DC

## 2013-01-03 NOTE — Telephone Encounter (Signed)
Patient called complaining of terrible headache following her botox injections.  She wants to know if there is something we can do to help ease her discomfort until the effects of the injections settle in.  We decided to do prednisone taper and vicoden for immediate relief of head pain.  She will call us back to let us know how she is doing.  I have called in 15 vicoden 5/325 for immediate relief, she will take one every 4-6 hours as needed until prednisone kicks in.

## 2013-01-31 ENCOUNTER — Encounter (HOSPITAL_COMMUNITY): Payer: Self-pay | Admitting: Pharmacist

## 2013-02-02 ENCOUNTER — Encounter (HOSPITAL_COMMUNITY): Payer: Self-pay

## 2013-02-02 ENCOUNTER — Encounter (HOSPITAL_COMMUNITY)
Admission: RE | Admit: 2013-02-02 | Discharge: 2013-02-02 | Disposition: A | Discharge status: Home / Self Care | Payer: Medicare PPO | Source: Ambulatory Visit | Attending: Obstetrics & Gynecology | Admitting: Obstetrics & Gynecology

## 2013-02-02 ENCOUNTER — Other Ambulatory Visit: Payer: Self-pay | Admitting: Internal Medicine

## 2013-02-02 LAB — CBC
HCT: 41.8 % (ref 36.0–46.0)
Hemoglobin: 14 g/dL (ref 12.0–15.0)
MCH: 27.6 pg (ref 26.0–34.0)
MCHC: 33.5 g/dL (ref 30.0–36.0)
MCV: 82.4 fL (ref 78.0–100.0)
Platelets: 235 10*3/uL (ref 150–400)
RBC: 5.07 MIL/uL (ref 3.87–5.11)
RDW: 13.9 % (ref 11.5–15.5)
WBC: 7.7 10*3/uL (ref 4.0–10.5)

## 2013-02-02 NOTE — Patient Instructions (Addendum)
20 KENESHA MOSHIER  02/02/2013   Your procedure is scheduled on:  02/13/13  Enter through the Main Entrance of [facility] at 8 AM.  Pick up the phone at the desk and dial [phone].   Call this number if you have problems the morning of surgery: [phone]   Remember:   Do not eat food:After Midnight.  Do not drink clear liquids: After Midnight.  Take these medicines the morning of surgery with A SIP OF WATER: Prilosec   Do not wear jewelry, make-up or nail polish.  Do not wear lotions, powders, or perfumes. You may wear deodorant.  Do not shave 48 hours prior to surgery.  Do not bring valuables to the hospital.  Contacts, dentures or bridgework may not be worn into surgery.  Leave suitcase in the car. After surgery it may be brought to your room.  For patients admitted to the hospital, checkout time is 11:00 AM the day of discharge.   Patients discharged the day of surgery will not be allowed to drive home.  Name and phone number of your driver: NA  Special Instructions: Shower using CHG 2 nights before surgery and the night before surgery.  If you shower the day of surgery use CHG.  Use special wash - you have one bottle of CHG for all showers.  You should use approximately 1/3 of the bottle for each shower.   Please read over the following fact sheets that you were given: Surgical Site Infection Prevention

## 2013-02-13 ENCOUNTER — Encounter (HOSPITAL_COMMUNITY)
Admission: RE | Disposition: A | Discharge status: Home / Self Care | Payer: Self-pay | Source: Ambulatory Visit | Attending: Obstetrics & Gynecology

## 2013-02-13 ENCOUNTER — Ambulatory Visit (HOSPITAL_COMMUNITY)
Admission: RE | Admit: 2013-02-13 | Discharge: 2013-02-14 | Disposition: A | Discharge status: Home / Self Care | Payer: Medicare PPO | Source: Ambulatory Visit | Attending: Obstetrics & Gynecology | Admitting: Obstetrics & Gynecology

## 2013-02-13 ENCOUNTER — Encounter (HOSPITAL_COMMUNITY): Payer: Self-pay | Admitting: Anesthesiology

## 2013-02-13 ENCOUNTER — Inpatient Hospital Stay (HOSPITAL_COMMUNITY): Payer: Medicare PPO | Admitting: Anesthesiology

## 2013-02-13 ENCOUNTER — Encounter (HOSPITAL_COMMUNITY): Payer: Self-pay | Admitting: *Deleted

## 2013-02-13 DIAGNOSIS — G4730 Sleep apnea, unspecified: Secondary | ICD-10-CM | POA: Insufficient documentation

## 2013-02-13 DIAGNOSIS — N993 Prolapse of vaginal vault after hysterectomy: Principal | ICD-10-CM | POA: Insufficient documentation

## 2013-02-13 DIAGNOSIS — N816 Rectocele: Secondary | ICD-10-CM | POA: Diagnosis present

## 2013-02-13 HISTORY — PX: RECTOCELE REPAIR: SHX761

## 2013-02-13 SURGERY — COLPORRHAPHY, POSTERIOR, FOR RECTOCELE REPAIR
Anesthesia: Spinal | Site: Vagina | Wound class: Clean Contaminated

## 2013-02-13 MED ORDER — ESTRADIOL 0.1 MG/GM VA CREA
TOPICAL_CREAM | VAGINAL | Status: AC
Start: 2013-02-13 — End: 2013-02-13
  Filled 2013-02-13: qty 42.5

## 2013-02-13 MED ORDER — IBUPROFEN 600 MG PO TABS
600.0000 mg | ORAL_TABLET | Freq: Four times a day (QID) | ORAL | Status: DC | PRN
  Administered 2013-02-14 (×2): 600 mg via ORAL
  Filled 2013-02-13 (×3): qty 1

## 2013-02-13 MED ORDER — MEPERIDINE HCL 25 MG/ML IJ SOLN: 6.2500 mg | INTRAMUSCULAR | Status: DC | PRN

## 2013-02-13 MED ORDER — DIPHENHYDRAMINE HCL 50 MG/ML IJ SOLN: 12.5000 mg | INTRAMUSCULAR | Status: DC | PRN

## 2013-02-13 MED ORDER — ACETAMINOPHEN 10 MG/ML IV SOLN
INTRAVENOUS | Status: DC | PRN
  Administered 2013-02-13: 1000 mg via INTRAVENOUS

## 2013-02-13 MED ORDER — NALOXONE HCL 0.4 MG/ML IJ SOLN: 0.4000 mg | INTRAMUSCULAR | Status: DC | PRN

## 2013-02-13 MED ORDER — ONDANSETRON HCL 4 MG/2ML IJ SOLN
INTRAMUSCULAR | Status: DC | PRN
  Administered 2013-02-13: 4 mg via INTRAVENOUS

## 2013-02-13 MED ORDER — BUPIVACAINE HCL (PF) 0.5 % IJ SOLN
INTRAMUSCULAR | Status: AC
  Filled 2013-02-13: qty 30

## 2013-02-13 MED ORDER — CEFAZOLIN SODIUM-DEXTROSE 2-3 GM-% IV SOLR
2.0000 g | INTRAVENOUS | Status: AC
  Administered 2013-02-13: 2 g via INTRAVENOUS

## 2013-02-13 MED ORDER — IBUPROFEN 600 MG PO TABS
600.0000 mg | ORAL_TABLET | Freq: Four times a day (QID) | ORAL | Status: DC | PRN
  Administered 2013-02-13: 600 mg via ORAL

## 2013-02-13 MED ORDER — OXYCODONE-ACETAMINOPHEN 5-325 MG PO TABS: 1.0000 | ORAL_TABLET | ORAL | Status: DC | PRN

## 2013-02-13 MED ORDER — SODIUM CHLORIDE 0.9 % IJ SOLN
INTRAMUSCULAR | Status: DC | PRN
  Administered 2013-02-13: 50 mL

## 2013-02-13 MED ORDER — SODIUM CHLORIDE 0.9 % IJ SOLN: 3.0000 mL | INTRAMUSCULAR | Status: DC | PRN

## 2013-02-13 MED ORDER — ONDANSETRON HCL 4 MG/2ML IJ SOLN: 4.0000 mg | Freq: Four times a day (QID) | INTRAMUSCULAR | Status: DC | PRN

## 2013-02-13 MED ORDER — HYDROCODONE-ACETAMINOPHEN 5-325 MG PO TABS
1.0000 | ORAL_TABLET | ORAL | Status: DC | PRN
  Administered 2013-02-13 – 2013-02-14 (×2): 1 via ORAL
  Filled 2013-02-13 (×2): qty 1

## 2013-02-13 MED ORDER — MIDAZOLAM HCL 5 MG/5ML IJ SOLN
INTRAMUSCULAR | Status: DC | PRN
  Administered 2013-02-13: 1 mg via INTRAVENOUS
  Administered 2013-02-13 (×2): 0.5 mg via INTRAVENOUS

## 2013-02-13 MED ORDER — METOCLOPRAMIDE HCL 5 MG/ML IJ SOLN: 10.0000 mg | Freq: Once | INTRAMUSCULAR | Status: DC | PRN

## 2013-02-13 MED ORDER — BUPIVACAINE IN DEXTROSE 0.75-8.25 % IT SOLN
INTRATHECAL | Status: DC | PRN
  Administered 2013-02-13: 1.2 mL via INTRATHECAL

## 2013-02-13 MED ORDER — ONDANSETRON HCL 4 MG/2ML IJ SOLN: 4.0000 mg | Freq: Three times a day (TID) | INTRAMUSCULAR | Status: DC | PRN

## 2013-02-13 MED ORDER — DIPHENHYDRAMINE HCL 50 MG/ML IJ SOLN: 25.0000 mg | INTRAMUSCULAR | Status: DC | PRN

## 2013-02-13 MED ORDER — ONDANSETRON HCL 4 MG PO TABS: 4.0000 mg | ORAL_TABLET | Freq: Four times a day (QID) | ORAL | Status: DC | PRN

## 2013-02-13 MED ORDER — METOCLOPRAMIDE HCL 5 MG/ML IJ SOLN: 10.0000 mg | Freq: Three times a day (TID) | INTRAMUSCULAR | Status: DC | PRN

## 2013-02-13 MED ORDER — NALOXONE HCL 1 MG/ML IJ SOLN
1.0000 ug/kg/h | INTRAVENOUS | Status: DC | PRN
  Filled 2013-02-13: qty 2

## 2013-02-13 MED ORDER — NALBUPHINE HCL 10 MG/ML IJ SOLN
5.0000 mg | INTRAMUSCULAR | Status: DC | PRN
  Filled 2013-02-13: qty 1

## 2013-02-13 MED ORDER — PHENYLEPHRINE HCL 10 MG/ML IJ SOLN
INTRAMUSCULAR | Status: DC | PRN
  Administered 2013-02-13: 40 ug via INTRAVENOUS

## 2013-02-13 MED ORDER — SODIUM CHLORIDE 0.9 % IV SOLN: 250.0000 mL | INTRAVENOUS | Status: DC | PRN

## 2013-02-13 MED ORDER — BUPIVACAINE HCL (PF) 0.5 % IJ SOLN
INTRAMUSCULAR | Status: DC | PRN
  Administered 2013-02-13: 30 mL

## 2013-02-13 MED ORDER — PROPOFOL INFUSION 10 MG/ML OPTIME
INTRAVENOUS | Status: DC | PRN
  Administered 2013-02-13: 75 ug/kg/min via INTRAVENOUS

## 2013-02-13 MED ORDER — ACETAMINOPHEN 10 MG/ML IV SOLN: 1000.0000 mg | Freq: Once | INTRAVENOUS | Status: DC

## 2013-02-13 MED ORDER — MORPHINE SULFATE (PF) 0.5 MG/ML IJ SOLN
INTRAMUSCULAR | Status: DC | PRN
  Administered 2013-02-13: .1 mg via EPIDURAL

## 2013-02-13 MED ORDER — FENTANYL CITRATE 0.05 MG/ML IJ SOLN
INTRAMUSCULAR | Status: DC | PRN
  Administered 2013-02-13 (×4): 25 ug via INTRAVENOUS

## 2013-02-13 MED ORDER — LACTATED RINGERS IV SOLN
INTRAVENOUS | Status: DC
  Administered 2013-02-13: 100 mL/h via INTRAVENOUS
  Administered 2013-02-13: 11:00:00 via INTRAVENOUS

## 2013-02-13 MED ORDER — DIPHENHYDRAMINE HCL 25 MG PO CAPS
25.0000 mg | ORAL_CAPSULE | ORAL | Status: DC | PRN
  Administered 2013-02-13: 25 mg via ORAL
  Filled 2013-02-13: qty 1

## 2013-02-13 MED ORDER — FENTANYL CITRATE 0.05 MG/ML IJ SOLN: 25.0000 ug | INTRAMUSCULAR | Status: DC | PRN

## 2013-02-13 MED ORDER — ZOLPIDEM TARTRATE 5 MG PO TABS: 5.0000 mg | ORAL_TABLET | Freq: Every evening | ORAL | Status: DC | PRN

## 2013-02-13 MED ORDER — ESTRADIOL 0.1 MG/GM VA CREA
TOPICAL_CREAM | VAGINAL | Status: DC | PRN
  Administered 2013-02-13: 1 via VAGINAL

## 2013-02-13 MED ORDER — SODIUM CHLORIDE 0.9 % IJ SOLN
3.0000 mL | Freq: Two times a day (BID) | INTRAMUSCULAR | Status: DC
  Administered 2013-02-13 (×2): 3 mL via INTRAVENOUS

## 2013-02-13 MED ORDER — 0.9 % SODIUM CHLORIDE (POUR BTL) OPTIME
TOPICAL | Status: DC | PRN
  Administered 2013-02-13: 1000 mL

## 2013-02-13 MED ORDER — SIMETHICONE 80 MG PO CHEW: 80.0000 mg | CHEWABLE_TABLET | Freq: Four times a day (QID) | ORAL | Status: DC | PRN

## 2013-02-13 SURGICAL SUPPLY — 21 items
CLOTH BEACON ORANGE TIMEOUT ST (SAFETY) ×3 IMPLANT
DECANTER SPIKE VIAL GLASS SM (MISCELLANEOUS) IMPLANT
GAUZE PACKING 1 X5 YD ST (GAUZE/BANDAGES/DRESSINGS) ×2 IMPLANT
GLOVE BIO SURGEON STRL SZ 6.5 (GLOVE) ×3 IMPLANT
GLOVE BIOGEL PI IND STRL 6.5 (GLOVE) ×2 IMPLANT
GLOVE BIOGEL PI INDICATOR 6.5 (GLOVE) ×1
GOWN STRL REIN XL XLG (GOWN DISPOSABLE) ×12 IMPLANT
NEEDLE HYPO 22GX1.5 SAFETY (NEEDLE) IMPLANT
NS IRRIG 1000ML POUR BTL (IV SOLUTION) ×3 IMPLANT
PACK VAGINAL WOMENS (CUSTOM PROCEDURE TRAY) ×3 IMPLANT
SUT CHROMIC 0 SH (SUTURE) IMPLANT
SUT CHROMIC 2 0 SH (SUTURE) IMPLANT
SUT VIC AB 0 CT1 27 (SUTURE)
SUT VIC AB 0 CT1 27XBRD ANBCTR (SUTURE) IMPLANT
SUT VIC AB 2-0 CT1 18 (SUTURE) ×2 IMPLANT
SUT VIC AB 2-0 CT1 27 (SUTURE)
SUT VIC AB 2-0 CT1 TAPERPNT 27 (SUTURE) IMPLANT
SUT VIC AB 2-0 CT2 27 (SUTURE) ×6 IMPLANT
TOWEL OR 17X24 6PK STRL BLUE (TOWEL DISPOSABLE) ×6 IMPLANT
TRAY FOLEY CATH 14FR (SET/KITS/TRAYS/PACK) ×3 IMPLANT
WATER STERILE IRR 1000ML POUR (IV SOLUTION) ×3 IMPLANT

## 2013-02-13 NOTE — H&P (Signed)
[name] [name] is an 77 y.o. female G3P3. She is here today for a posterior repair. This started last year. Her main symptom is vaginal pressure. She saw the bulge last year. She has been on vaginal estrogen QOD and declines a pessary. She wants a surgical repair.  Pertinent Gynecological History: Menses: denies Bleeding: none Contraception: none DES exposure: denies Blood transfusions: yes, due to menorrhagia Sexually transmitted diseases: no past history Previous GYN Procedures: DNC  Last mammogram: normal Date: 2013 Last pap: normal Date:  OB History: G3, P3   Menstrual History: Menarche age: 15 No LMP recorded. Patient has had a hysterectomy.    Past Medical History  Diagnosis Date  . Lacunar infarction   . Hyperlipidemia   . History of colonic polyps   . Herniated disc   . Common migraine   . Acid reflux disease   . Diverticulosis   . Esophageal stricture   . Hiatal hernia 2009    EGD-Small     Past Surgical History  Procedure Laterality Date  . Total abdominal hysterectomy    . Inguinal hernia repair      rt.  . Cholecystectomy      4-5 YRS AGO.  Marland Kitchen Eye surgery      BILATERAL CATARAT    Family History  Problem Relation Age of Onset  . Cancer Brother     kidney cancer  . Aneurysm Sister 37    died   . Breast cancer Maternal Aunt   . Breast cancer Paternal Aunt   . Colon cancer Neg Hx     Social History:  reports that she has never smoked. She has never used smokeless tobacco. She reports that she does not drink alcohol or use illicit drugs.  Allergies:  Allergies  Allergen Reactions  . Oxycodone Hcl     REACTION: vomiting    Prescriptions prior to admission  Medication Sig Dispense Refill  . aspirin EC 81 MG tablet Take 81 mg by mouth daily.      . calcium-vitamin D (CALCIUM 500+D) 500-200 MG-UNIT per tablet Take 1 tablet by mouth daily.        . celecoxib (CELEBREX) 100 MG capsule Take 1 capsule (100 mg total) by mouth daily.  30 capsule  11   . ESTRACE VAGINAL 0.1 MG/GM vaginal cream       . fish oil-omega-3 fatty acids 1000 MG capsule Take 1 g by mouth daily.      . Misc Natural Products (LUTEIN VISION BLEND) CAPS Take 1 capsule by mouth daily.       . multivitamin (THERAGRAN) per tablet Take 1 tablet by mouth daily. Silver Centrum      . omeprazole (PRILOSEC) 20 MG capsule Take 20 mg by mouth daily.        Marland Kitchen tiZANidine (ZANAFLEX) 4 MG tablet Take 2 mg by mouth as needed.       . vitamin E 400 UNIT capsule Take 400 Units by mouth daily.      Marland Kitchen omeprazole (PRILOSEC) 20 MG capsule TAKE 1 CAPSULE BY MOUTH 2 TIMES DAILY  60 capsule  2  . predniSONE (DELTASONE) 10 MG tablet Take 10 mg by mouth daily. 4 tab day 1, 3 tab day 2, 2 tab day 3, 1 tab day 4        ROS Married for 55 years retired  Blood pressure 142/84, pulse 97, temperature 98.1 F (36.7 C), temperature source Oral, resp. rate 18, height 5\' 4"  (1.626 m), weight  67.132 kg (148 lb), SpO2 98.00%. Physical Exam Heart- rrr Lungs- CTAB Abd- benign Vagina- 4 th degree rectocele  No results found for this or any previous visit (from the past 24 hour(s)).  No results found.  Assessment/Plan: rectocele. Posterior repair  She understands the risks of surgery, including, but not to infection, bleeding, DVTs, damage to bowel, bladder, ureters. She wishes to proceed.    Jobe Mutch C. 02/13/2013, 9:13 AM

## 2013-02-13 NOTE — Progress Notes (Signed)
Hx sleep apnea

## 2013-02-13 NOTE — Anesthesia Procedure Notes (Signed)
Spinal  Patient location during procedure: OR Start time: 02/13/2013 9:56 AM Staffing Performed by: anesthesiologist  Preanesthetic Checklist Completed: patient identified, site marked, surgical consent, pre-op evaluation, timeout performed, IV checked, risks and benefits discussed and monitors and equipment checked Spinal Block Patient position: sitting Prep: site prepped and draped and DuraPrep Patient monitoring: heart rate, cardiac monitor, continuous pulse ox and blood pressure Approach: midline Location: L3-4 Injection technique: single-shot Needle Needle type: Sprotte  Needle gauge: 24 G Needle length: 9 cm Assessment Sensory level: T4 Additional Notes Clear free flow CSF on first attempt.  No paresthesia.  Patient tolerated procedure well with no apparent complications.  Jasmine December, MD

## 2013-02-13 NOTE — Anesthesia Preprocedure Evaluation (Addendum)
Anesthesia Evaluation  Patient identified by MRN, date of birth, ID band Patient awake    Reviewed: Allergy & Precautions, H&P , NPO status , Patient's Chart, lab work & pertinent test results, reviewed documented beta blocker date and time   History of Anesthesia Complications Negative for: history of anesthetic complications  Airway Mallampati: I TM Distance: >3 FB Neck ROM: full    Dental  (+) Teeth Intact   Pulmonary sleep apnea (uses CPAP 2-3 days/week) and Continuous Positive Airway Pressure Ventilation ,  breath sounds clear to auscultation  Pulmonary exam normal       Cardiovascular negative cardio ROS  Rhythm:regular Rate:Normal     Neuro/Psych  Headaches, Herniated disc CVA negative psych ROS   GI/Hepatic Neg liver ROS, hiatal hernia, GERD-  Medicated,diverticulosis   Endo/Other  negative endocrine ROS  Renal/GU negative Renal ROS  Female GU complaint     Musculoskeletal   Abdominal   Peds  Hematology negative hematology ROS (+)   Anesthesia Other Findings   Reproductive/Obstetrics negative OB ROS                          Anesthesia Physical Anesthesia Plan  ASA: III  Anesthesia Plan: Spinal   Post-op Pain Management:    Induction:   Airway Management Planned:   Additional Equipment:   Intra-op Plan:   Post-operative Plan:   Informed Consent: I have reviewed the patients History and Physical, chart, labs and discussed the procedure including the risks, benefits and alternatives for the proposed anesthesia with the patient or authorized representative who has indicated his/her understanding and acceptance.   Dental Advisory Given  Plan Discussed with: CRNA and Surgeon  Anesthesia Plan Comments: (Presented spinal vs GA - pt prefers spinal)       Anesthesia Quick Evaluation

## 2013-02-13 NOTE — Anesthesia Postprocedure Evaluation (Signed)
  Anesthesia Post-op Note  Anesthesia Post Note  Patient: [name] [name]  Procedure(s) Performed: Procedure(s) (LRB): POSTERIOR REPAIR (RECTOCELE) (N/A)  Anesthesia type: Spinal  Patient location: PACU  Post pain: Pain level controlled  Post assessment: Post-op Vital signs reviewed  Last Vitals:  Filed Vitals:   02/13/13 1215  BP: 113/58  Pulse: 72  Temp: 36.9 C  Resp: 16    Post vital signs: Reviewed  Level of consciousness: awake  Complications: No apparent anesthesia complications

## 2013-02-13 NOTE — Brief Op Note (Signed)
02/13/2013  1:10 PM  PATIENT:  [name] [name]  77 y.o. female  PRE-OPERATIVE DIAGNOSIS:  CPT [id number] Symptomatic rectocele  POST-OPERATIVE DIAGNOSIS:  symptomatic rectocele  PROCEDURE:  Procedure(s): POSTERIOR REPAIR (RECTOCELE) (N/A)  SURGEON:  Surgeon(s) and Role:    * Allie Bossier, MD - Primary    PHYSICIAN ASSISTANT:   ASSISTANTS: Peggy Constant, MD   ANESTHESIA:   spinal  EBL:  Total I/O In: 1400 [I.V.:1400] Out: 195 [Urine:175; Blood:20]  BLOOD ADMINISTERED:none  DRAINS: none   LOCAL MEDICATIONS USED:  MARCAINE     SPECIMEN:  No Specimen  DISPOSITION OF SPECIMEN:  N/A  COUNTS:  YES  TOURNIQUET:  * No tourniquets in log *  DICTATION: .Dragon Dictation  PLAN OF CARE: Admit for overnight observation  PATIENT DISPOSITION:  PACU - hemodynamically stable.   Delay start of Pharmacological VTE agent (>24hrs) due to surgical blood loss or risk of bleeding: not applicable  The risks, benefits, and alternatives of surgery were explained, understood, accepted. Consents were signed. All questions were answered. She was taken to the operating room and spinal anesthesia was applied without complication. She was placed in the dorsal lithotomy position. Her abdomen was prepped and draped in the usual sterile fashion. A Foley catheter draining clear urine throughout case. A timeout procedure was done prior to putting in the Foley. Her vagina was once again inspected and a large posterior defect (rectocele) was noted. Only a minimal cystocele was noted. I used dilute Marcaine solution for hydrodissection. A used a total of about 60 cc. I then removed a triangular portion of skin at the posterior edge of the vagina/perineum. I used Metzenbaum scissors to dissect and incised the vaginal mucosa in the midline. T. Clamps were used to hold the edges of the vaginal mucosa. I carefully dissected the excess vaginal mucosa away from the rectovaginal fascia. I closed the defect with 2 layers  of interrupted 2-0 Vicryl sutures. I excised the excess vaginal mucosa. I closed the vaginal mucosal edges together with a 2-0 Vicryl running locking suture. I closed the perineum yielding excellent cosmetic results. Her vagina caliber was such that it could accommodate 3 fingers postoperatively. I placed estrogen cream covered gauze in her vagina for packing. She tolerated the procedure well and was taken to recovery room in stable condition.

## 2013-02-13 NOTE — Transfer of Care (Signed)
Immediate Anesthesia Transfer of Care Note  Patient: [name] [name]  Procedure(s) Performed: Procedure(s): POSTERIOR REPAIR (RECTOCELE) (N/A)  Patient Location: PACU  Anesthesia Type:Spinal  Level of Consciousness: awake, alert  and oriented  Airway & Oxygen Therapy: Patient Spontanous Breathing and Patient connected to nasal cannula oxygen  Post-op Assessment: Report given to PACU RN and Post -op Vital signs reviewed and stable  Post vital signs: Reviewed and stable  Complications: No apparent anesthesia complications

## 2013-02-13 NOTE — Anesthesia Postprocedure Evaluation (Signed)
  Anesthesia Post-op Note  Patient: [name] [name]  Procedure(s) Performed: Procedure(s): POSTERIOR REPAIR (RECTOCELE) (N/A)  Patient Location: [facility]  Anesthesia Type:Spinal and MAC combined with regional for post-op pain  Level of Consciousness: awake, alert  and oriented  Airway and Oxygen Therapy: Patient Spontanous Breathing  Post-op Pain: none  Post-op Assessment: Post-op Vital signs reviewed and Patient's Cardiovascular Status Stable  Post-op Vital Signs: Reviewed and stable  Complications: No apparent anesthesia complications

## 2013-02-13 NOTE — Anesthesia Postprocedure Evaluation (Signed)
  Anesthesia Post-op Note  Patient: [name] [name]  Procedure(s) Performed: Procedure(s): POSTERIOR REPAIR (RECTOCELE) (N/A)  Patient Location: [facility]  Anesthesia Type:Epidural  Level of Consciousness: awake, alert  and oriented  Airway and Oxygen Therapy: Patient Spontanous Breathing  Post-op Pain: none  Post-op Assessment: Post-op Vital signs reviewed and Patient's Cardiovascular Status Stable  Post-op Vital Signs: Reviewed and stable  Complications: No apparent anesthesia complications

## 2013-02-14 ENCOUNTER — Encounter (HOSPITAL_COMMUNITY): Payer: Self-pay | Admitting: Obstetrics & Gynecology

## 2013-02-14 MED ORDER — HYDROCODONE-ACETAMINOPHEN 5-325 MG PO TABS: 1.0000 | ORAL_TABLET | ORAL | Status: DC | PRN

## 2013-02-14 MED ORDER — IBUPROFEN 600 MG PO TABS: 600.0000 mg | ORAL_TABLET | Freq: Four times a day (QID) | ORAL | Status: DC | PRN

## 2013-02-14 NOTE — Progress Notes (Signed)
Vaginal packing removed as ordered, small amount dark vaginal drainage noted on packing.  Patient tolerated well.  Foley cath d/c'd as ordered.

## 2013-02-14 NOTE — Discharge Summary (Signed)
Physician Discharge Summary  Patient ID: [name] [name] MRN: [id number] DOB/AGE: 06/22/1936 77 y.o.  Admit date: 02/13/2013 Discharge date: 02/14/2013  Admission Diagnoses: symptomatic rectocele   Discharge Diagnoses: same Active Problems:   Rectocele   Discharged Condition: good  Hospital Course: She underwent an uncomplicated posterior repair under spinal anesthesia. She received duramorph and did well post operatively. By POD #1 she was requesting to go home. She was ambulating, voiding, and tolerating po without nausea/vomitting.  Consults: None  Significant Diagnostic Studies: none  Treatments: surgery: as above  Discharge Exam: Blood pressure 126/66, pulse 78, temperature 97.6 F (36.4 C), temperature source Oral, resp. rate 18, height 5\' 4"  (1.626 m), weight 68.04 kg (150 lb), SpO2 95.00%. General appearance: alert Resp: clear to auscultation bilaterally Cardio: regular rate and rhythm, S1, S2 normal, no murmur, click, rub or gallop GI: soft, non-tender; bowel sounds normal; no masses,  no organomegaly Vulva/vagina: healing well Disposition: Final discharge disposition not confirmed   Future Appointments Provider Department Dept Phone   03/14/2013 10:00 AM Carolynn Serve, NP [facility] [phone]       Medication List    TAKE these medications       aspirin EC 81 MG tablet  Take 81 mg by mouth daily.     CALCIUM 500+D 500-200 MG-UNIT per tablet  Generic drug:  calcium-vitamin D  Take 1 tablet by mouth daily.     celecoxib 100 MG capsule  Commonly known as:  CELEBREX  Take 1 capsule (100 mg total) by mouth daily.     ESTRACE VAGINAL 0.1 MG/GM vaginal cream  Generic drug:  estradiol     fish oil-omega-3 fatty acids 1000 MG capsule  Take 1 g by mouth daily.     HYDROcodone-acetaminophen 5-325 MG per tablet  Commonly known as:  NORCO/VICODIN  Take 1-2 tablets by mouth every 4 (four) hours as needed.     ibuprofen 600 MG tablet  Commonly known as:  ADVIL,MOTRIN  Take 1 tablet (600 mg total) by mouth every 6 (six) hours as needed.     LUTEIN VISION BLEND Caps  Take 1 capsule by mouth daily.     multivitamin per tablet  Take 1 tablet by mouth daily. Silver Centrum     omeprazole 20 MG capsule  Commonly known as:  PRILOSEC  Take 20 mg by mouth daily.     omeprazole 20 MG capsule  Commonly known as:  PRILOSEC  TAKE 1 CAPSULE BY MOUTH 2 TIMES DAILY     predniSONE 10 MG tablet  Commonly known as:  DELTASONE  Take 10 mg by mouth daily. 4 tab day 1, 3 tab day 2, 2 tab day 3, 1 tab day 4     tiZANidine 4 MG tablet  Commonly known as:  ZANAFLEX  Take 2 mg by mouth as needed.     vitamin E 400 UNIT capsule  Take 400 Units by mouth daily.           Follow-up Information   Follow up with Shakiara Lukic C., MD. Schedule an appointment as soon as possible for a visit in 4 weeks.   Contact information:   [address] [place] [address] [phone]       Signed: Allie Bossier 02/14/2013, 12:27 PM

## 2013-03-14 ENCOUNTER — Encounter: Payer: Medicare PPO | Admitting: Nurse Practitioner

## 2013-03-28 ENCOUNTER — Ambulatory Visit (INDEPENDENT_AMBULATORY_CARE_PROVIDER_SITE_OTHER): Payer: Medicare PPO | Admitting: Nurse Practitioner

## 2013-03-28 ENCOUNTER — Encounter: Payer: Medicare PPO | Admitting: Obstetrics & Gynecology

## 2013-03-28 ENCOUNTER — Encounter: Payer: Self-pay | Admitting: Nurse Practitioner

## 2013-03-28 VITALS — BP 158/81 | HR 85 | Ht 64.0 in | Wt 151.0 lb

## 2013-03-28 DIAGNOSIS — G43909 Migraine, unspecified, not intractable, without status migrainosus: Secondary | ICD-10-CM | POA: Insufficient documentation

## 2013-03-28 DIAGNOSIS — G44209 Tension-type headache, unspecified, not intractable: Secondary | ICD-10-CM

## 2013-03-28 MED ORDER — ISOMETHEPTENE-APAP-DICHLORAL 65-325-100 MG PO CAPS: 1.0000 | ORAL_CAPSULE | Freq: Four times a day (QID) | ORAL | Status: DC | PRN

## 2013-03-28 MED ORDER — TIZANIDINE HCL 4 MG PO TABS: 4.0000 mg | ORAL_TABLET | Freq: Three times a day (TID) | ORAL | Status: DC | PRN

## 2013-03-28 NOTE — Progress Notes (Signed)
S: Pt in office today for follow up on migraines and repeat Botox. She had a difficult first week and we called in a prednisone pack and vicoden. Then she had a very good 2 months, the last 2 weeks her headaches have been coming back. Overall she felt the Botox worked very well and she would like to have it again today. She has had no severe , 3-4 moderates and 6 milds.  She has been taking Zanaflex at night and using her CPAP the best she can. She is not longer taking the Zonegran and does not feel it was helpful. She would like to have something to take when she gets a headache.  O: General: Alert, oriented, at times a bit scattered Muscle: Trap are less tight and tender Skin: has rash and sores on arms and neck where she has been scratching skin ( for 18 years )  Procedure: Botox was given today in usual pattern with more in right side than left. See procedure note. Lot # [id number]  Ex Date Oct 2016  A: Migraine Tension headache Sleep Apnea  P: Will give Botox today 100 units Will refill Zanaflex Midrin q 6 hours prn tension headache RTC 3 months

## 2013-03-28 NOTE — Patient Instructions (Signed)
Botox Cosmetic Injections Botox Cosmetic (botulinum toxin type A) is a purified, very weak (diluted) form of botulinum toxin. This toxin is produced by a bacteria called Clostridium botulinum. The toxin works by blocking nerve impulses to muscles.  Botox Cosmetic may be used as a non-surgical method to soften facial lines and wrinkles. The toxin weakens the muscle, which reduces the lines of facial expression. It is most effective for:  Forehead lines.  Crow's feet.  Frown lines.  Neck wrinkles. YOU SHOULD NOT RECEIVE BOTOX INJECTIONS IF YOU:   Have myasthenia gravis.  Have allergies to human albumin.  Are pregnant or breastfeeding.  Have taken aspirin or anti-inflammatory medicine in the last 2 weeks.  Have neuromuscular disease.  Have allergies to botulinum toxin.  Have had beer, wine, or any other alcohol in the last week. RISKS AND COMPLICATIONS   Soreness.  Small bruises may develop around the injection sites.  Rarely, drooping of the eyebrow or eyelid can occur. This may last 3 to 6 months but is reversible.  Rarely, double vision develops. This can last 3 to 6 months but is usually reversible.  Rarely, changes in voice or speech, loss of the ability to close the eyes, and difficulty breathing may occur. These side effects can occur as early as several hours after injections, or they may be delayed up to several weeks. BEFORE THE PROCEDURE   Do not drink alcohol for 1 week prior to treatment.  Do not take anti-inflammatory medicines or aspirin for 2 weeks before treatment. Ask your caregiver whether you should stop taking blood thinners (anticoagulants) before treatment.  Ask your caregiver about changing or stopping your regular medicines. PROCEDURE Your caregiver will use a thin needle to inject the Botox into your skin or muscles. This takes about 15 minutes. AFTER THE PROCEDURE   It may take 5 to 14 days before softening of the facial lines in the  treated areas begins.  A second injection is sometimes needed if the first treatment is not entirely successful.  Results last about 4 to 6 months. Injections can be repeated every 4 to 6 months. There are no known long-term side effects. Document Released: 08/13/2004 Document Revised: 02/08/2012 Document Reviewed: 03/24/2011 [facility] Patient Information 2013 [place], [place].

## 2013-06-25 ENCOUNTER — Encounter (HOSPITAL_COMMUNITY): Payer: Self-pay | Admitting: *Deleted

## 2013-06-25 ENCOUNTER — Emergency Department (HOSPITAL_COMMUNITY): Payer: Medicare PPO

## 2013-06-25 ENCOUNTER — Inpatient Hospital Stay (HOSPITAL_COMMUNITY)
Admission: EM | Admit: 2013-06-25 | Discharge: 2013-06-27 | DRG: 872 | Disposition: A | Discharge status: Home / Self Care | Payer: Medicare PPO | Attending: Internal Medicine | Admitting: Internal Medicine

## 2013-06-25 DIAGNOSIS — N10 Acute tubulo-interstitial nephritis: Secondary | ICD-10-CM | POA: Diagnosis present

## 2013-06-25 DIAGNOSIS — N12 Tubulo-interstitial nephritis, not specified as acute or chronic: Secondary | ICD-10-CM

## 2013-06-25 DIAGNOSIS — G43909 Migraine, unspecified, not intractable, without status migrainosus: Secondary | ICD-10-CM

## 2013-06-25 DIAGNOSIS — Z860100 Personal history of colon polyps, unspecified: Secondary | ICD-10-CM

## 2013-06-25 DIAGNOSIS — R509 Fever, unspecified: Secondary | ICD-10-CM

## 2013-06-25 DIAGNOSIS — D72829 Elevated white blood cell count, unspecified: Secondary | ICD-10-CM | POA: Diagnosis present

## 2013-06-25 DIAGNOSIS — R519 Headache, unspecified: Secondary | ICD-10-CM | POA: Diagnosis present

## 2013-06-25 DIAGNOSIS — IMO0002 Reserved for concepts with insufficient information to code with codable children: Secondary | ICD-10-CM | POA: Diagnosis present

## 2013-06-25 DIAGNOSIS — K219 Gastro-esophageal reflux disease without esophagitis: Secondary | ICD-10-CM | POA: Diagnosis present

## 2013-06-25 DIAGNOSIS — E785 Hyperlipidemia, unspecified: Secondary | ICD-10-CM | POA: Diagnosis present

## 2013-06-25 DIAGNOSIS — R339 Retention of urine, unspecified: Secondary | ICD-10-CM

## 2013-06-25 DIAGNOSIS — E871 Hypo-osmolality and hyponatremia: Secondary | ICD-10-CM | POA: Diagnosis present

## 2013-06-25 DIAGNOSIS — Z79899 Other long term (current) drug therapy: Secondary | ICD-10-CM

## 2013-06-25 DIAGNOSIS — Z8673 Personal history of transient ischemic attack (TIA), and cerebral infarction without residual deficits: Secondary | ICD-10-CM

## 2013-06-25 DIAGNOSIS — R51 Headache: Secondary | ICD-10-CM | POA: Diagnosis present

## 2013-06-25 DIAGNOSIS — Z7982 Long term (current) use of aspirin: Secondary | ICD-10-CM

## 2013-06-25 DIAGNOSIS — A419 Sepsis, unspecified organism: Secondary | ICD-10-CM | POA: Diagnosis present

## 2013-06-25 LAB — COMPREHENSIVE METABOLIC PANEL
ALT: 13 U/L (ref 0–35)
AST: 17 U/L (ref 0–37)
Albumin: 3.8 g/dL (ref 3.5–5.2)
Alkaline Phosphatase: 97 U/L (ref 39–117)
BUN: 10 mg/dL (ref 6–23)
CO2: 25 mEq/L (ref 19–32)
Calcium: 9.3 mg/dL (ref 8.4–10.5)
Chloride: 96 mEq/L (ref 96–112)
Creatinine, Ser: 0.74 mg/dL (ref 0.50–1.10)
GFR calc Af Amer: >90 mL/min (ref >90)
GFR calc non Af Amer: 81 mL/min — ABNORMAL LOW (ref >90)
Glucose, Bld: 131 mg/dL — ABNORMAL HIGH (ref 70–99)
Potassium: 3.3 mEq/L — ABNORMAL LOW (ref 3.5–5.1)
Sodium: 134 mEq/L — ABNORMAL LOW (ref 135–145)
Total Bilirubin: 0.7 mg/dL (ref 0.3–1.2)
Total Protein: 7.6 g/dL (ref 6.0–8.3)

## 2013-06-25 LAB — URINALYSIS, ROUTINE W REFLEX MICROSCOPIC
Bilirubin Urine: NEGATIVE
Glucose, UA: NEGATIVE mg/dL
Ketones, ur: NEGATIVE mg/dL
Nitrite: POSITIVE — AB
Protein, ur: >300 mg/dL — AB
Specific Gravity, Urine: 1.012 (ref 1.005–1.030)
Urobilinogen, UA: 1 mg/dL (ref 0.0–1.0)
pH: 6 (ref 5.0–8.0)

## 2013-06-25 LAB — CBC
HCT: 43.9 % (ref 36.0–46.0)
Hemoglobin: 14.6 g/dL (ref 12.0–15.0)
MCH: 27 pg (ref 26.0–34.0)
MCHC: 33.3 g/dL (ref 30.0–36.0)
MCV: 81.1 fL (ref 78.0–100.0)
Platelets: 252 10*3/uL (ref 150–400)
RBC: 5.41 MIL/uL — ABNORMAL HIGH (ref 3.87–5.11)
RDW: 14.5 % (ref 11.5–15.5)
WBC: 15.6 10*3/uL — ABNORMAL HIGH (ref 4.0–10.5)

## 2013-06-25 LAB — URINE MICROSCOPIC-ADD ON

## 2013-06-25 MED ORDER — IOHEXOL 300 MG/ML  SOLN: 50.0000 mL | Freq: Once | INTRAMUSCULAR | Status: DC | PRN

## 2013-06-25 MED ORDER — SODIUM CHLORIDE 0.9 % IV SOLN
Freq: Once | INTRAVENOUS | Status: AC
  Administered 2013-06-25: 22:00:00 via INTRAVENOUS

## 2013-06-25 MED ORDER — PANTOPRAZOLE SODIUM 40 MG PO TBEC: 40.0000 mg | DELAYED_RELEASE_TABLET | Freq: Every day | ORAL | Status: DC

## 2013-06-25 MED ORDER — SODIUM CHLORIDE 0.9 % IV BOLUS (SEPSIS)
1000.0000 mL | Freq: Once | INTRAVENOUS | Status: AC
  Administered 2013-06-25: 1000 mL via INTRAVENOUS

## 2013-06-25 MED ORDER — MORPHINE SULFATE 4 MG/ML IJ SOLN
4.0000 mg | Freq: Once | INTRAMUSCULAR | Status: AC
  Administered 2013-06-25: 4 mg via INTRAVENOUS
  Filled 2013-06-25: qty 1

## 2013-06-25 MED ORDER — DEXTROSE 5 % IV SOLN
1.0000 g | Freq: Once | INTRAVENOUS | Status: AC
  Administered 2013-06-25: 1 g via INTRAVENOUS
  Filled 2013-06-25: qty 10

## 2013-06-25 MED ORDER — ONDANSETRON HCL 4 MG/2ML IJ SOLN
4.0000 mg | Freq: Once | INTRAMUSCULAR | Status: AC
  Administered 2013-06-25: 4 mg via INTRAVENOUS
  Filled 2013-06-25: qty 2

## 2013-06-25 MED ORDER — ACETAMINOPHEN 325 MG PO TABS
975.0000 mg | ORAL_TABLET | Freq: Once | ORAL | Status: AC
  Administered 2013-06-25: 975 mg via ORAL
  Filled 2013-06-25: qty 3

## 2013-06-25 NOTE — ED Provider Notes (Signed)
CSN: [id number]     Arrival date & time 06/25/13  2043 History     First MD Initiated Contact with Patient 06/25/13 2052     Chief Complaint  Patient presents with  . Abdominal Pain   (Consider location/radiation/quality/duration/timing/severity/associated sxs/prior Treatment) Patient is a 77 y.o. female presenting with abdominal pain. The history is provided by the patient and medical records. No language interpreter was used.  Abdominal Pain Associated symptoms include abdominal pain and headaches. Pertinent negatives include no chest pain, diaphoresis, fatigue, fever, nausea, rash or vomiting.    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 female  with a hx of GERD,  Lacunar infart, herniated disc (surgery 15 years ago), migraine headache, diverticulosis presents to the Emergency Department complaining of gradual, persistent, progressively worsening lower abd pain with associated low back pain and urinary retention for 3 days.  Pt states the urinary retention began prior to her back pain. Pt states she has significant urgency, but is only able to produce a small dribble of urine each time.  Urine is cloudy, but she denies dysuria or hematuria.  Nothing alleviates the abd pain.  Associated symptoms include migraine headache.  Pt states headache is like previous headaches, but has been persistent since Friday when all of her other symptoms began.  Nothing makes the headache better or worse.  Pt denies fever, chills, chest pain, SOB, nausea, vomiting, diarrhea, weakness, dizziness, syncope.       Past Medical History  Diagnosis Date  . Lacunar infarction   . Hyperlipidemia   . History of colonic polyps   . Herniated disc   . Common migraine   . Acid reflux disease   . Diverticulosis   . Esophageal stricture   . Hiatal hernia 2009    EGD-Small    Past Surgical History  Procedure Laterality Date  . Total abdominal hysterectomy    . Inguinal hernia repair      rt.  . Cholecystectomy      4-5 YRS  AGO.  Marland Kitchen Eye surgery      BILATERAL CATARAT  . Rectocele repair N/A 02/13/2013    Procedure: POSTERIOR REPAIR (RECTOCELE);  Surgeon: Allie Bossier, MD;  Location: [facility];  Service: Gynecology;  Laterality: N/A;   Family History  Problem Relation Age of Onset  . Cancer Brother     kidney cancer  . Aneurysm Sister 37    died   . Breast cancer Maternal Aunt   . Breast cancer Paternal Aunt   . Colon cancer Neg Hx    History  Substance Use Topics  . Smoking status: Never Smoker   . Smokeless tobacco: Never Used  . Alcohol Use: No   OB History   Grav Para Term Preterm Abortions TAB SAB Ect Mult Living   3 3        3      Review of Systems  Constitutional: Negative for fever, diaphoresis, appetite change and fatigue.  Respiratory: Negative for shortness of breath.   Cardiovascular: Negative for chest pain.  Gastrointestinal: Positive for abdominal pain. Negative for nausea, vomiting, diarrhea, constipation and blood in stool.  Genitourinary: Positive for urgency and frequency. Negative for dysuria, hematuria, flank pain and difficulty urinating.  Musculoskeletal: Negative for back pain.  Skin: Negative for rash.  Neurological: Positive for headaches.  All other systems reviewed and are negative.    Allergies  Isometheptene-apap-dichloral and Oxycodone hcl  Home Medications   Current Outpatient Rx  Name  Route  Sig  Dispense  Refill  . aspirin EC 81 MG tablet   Oral   Take 81 mg by mouth every morning.          . Biotin 1 MG CAPS   Oral   Take 1 capsule by mouth every morning.          . cholecalciferol (VITAMIN D) 1000 UNITS tablet   Oral   Take 1,000 Units by mouth every morning.         . fish oil-omega-3 fatty acids 1000 MG capsule   Oral   Take 1 g by mouth daily.         . Multiple Vitamins-Minerals (CENTRUM SILVER ADULT 50+) TABS   Oral   Take 1 tablet by mouth every morning.         . Multiple Vitamins-Minerals (VISION-VITE PRESERVE PO)    Oral   Take 1 capsule by mouth 2 (two) times daily.          Marland Kitchen omeprazole (PRILOSEC) 20 MG capsule   Oral   Take 20 mg by mouth every morning.           BP 161/80  Pulse 125  Temp(Src) 101.9 F (38.8 C) (Oral)  Resp 20  SpO2 98% Physical Exam  Nursing note and vitals reviewed. Constitutional: She is oriented to person, place, and time. She appears well-developed and well-nourished. No distress.  HENT:  Head: Normocephalic and atraumatic.  Mouth/Throat: Oropharynx is clear and moist.  Eyes: Conjunctivae and EOM are normal. Pupils are equal, round, and reactive to light. No scleral icterus.  Neck: Normal range of motion and full passive range of motion without pain. Neck supple. No spinous process tenderness and no muscular tenderness present. No rigidity. Normal range of motion present.  Cardiovascular: Regular rhythm, S1 normal, S2 normal, normal heart sounds and intact distal pulses.  Tachycardia present.   Pulses:      Radial pulses are 2+ on the right side, and 2+ on the left side.       Dorsalis pedis pulses are 2+ on the right side, and 2+ on the left side.       Posterior tibial pulses are 2+ on the right side, and 2+ on the left side.  Tachycardic on exam Capillary refill less than 3 seconds  Pulmonary/Chest: Effort normal and breath sounds normal. No respiratory distress. She has no wheezes. She has no rales.  Abdominal: Soft. Normal appearance and bowel sounds are normal. She exhibits distension (mild). She exhibits no mass. There is tenderness in the right lower quadrant, suprapubic area and left lower quadrant. There is CVA tenderness (right). There is no rigidity, no rebound, no guarding, no tenderness at McBurney's point and negative Murphy's sign.    Musculoskeletal: Normal range of motion. She exhibits no edema and no tenderness.  No midline tenderness of the C-spine, T-spine or L-spine  Lymphadenopathy:    She has no cervical adenopathy.  Neurological: She is  alert and oriented to person, place, and time. She has normal reflexes. No cranial nerve deficit. She exhibits normal muscle tone. Coordination normal.  Speech is clear and goal oriented, follows commands Cranial nerves III - XII without deficit, no facial droop Normal strength in upper and lower extremities bilaterally, strong and equal grip strength Sensation normal to light and sharp touch Moves extremities without ataxia, coordination intact Normal finger to nose and rapid alternating movements Neg romberg, no pronator drift Normal gait Normal heel-shin and balance   Skin: Skin is  warm and dry. No rash noted. She is not diaphoretic.  Psychiatric: She has a normal mood and affect. Her behavior is normal. Judgment and thought content normal.    ED Course   Procedures (including critical care time)  Labs Reviewed  URINALYSIS, ROUTINE W REFLEX MICROSCOPIC - Abnormal; Notable for the following:    APPearance TURBID (*)    Hgb urine dipstick LARGE (*)    Protein, ur >300 (*)    Nitrite POSITIVE (*)    Leukocytes, UA LARGE (*)    All other components within normal limits  CBC - Abnormal; Notable for the following:    WBC 15.6 (*)    RBC 5.41 (*)    All other components within normal limits  COMPREHENSIVE METABOLIC PANEL - Abnormal; Notable for the following:    Sodium 134 (*)    Potassium 3.3 (*)    Glucose, Bld 131 (*)    GFR calc non Af Amer 81 (*)    All other components within normal limits  URINE MICROSCOPIC-ADD ON - Abnormal; Notable for the following:    Bacteria, UA FEW (*)    All other components within normal limits  URINE CULTURE   No results found. 1. Pyelonephritis   2. Fever   3. Migraine   4. Urinary retention     MDM  Rico Ala principal abdominal pain, low back pain urinary retention and right flank pain for 3 days.  Patient also complaining of migraine headache for 3 days. She is taken any medications for this. She is neurologically intact. No  evidence of CVA or cauda equina. Patient febrile on arrival to 102, tachycardic to 125. CBC with leukocytosis of 15.6, CMP with mild hypokalemia at 3.3. In and out cath urine with positive nitrates large leukocytes and too numerous to count white blood cells.  Patient with acute pyelonephritis.  Primary care: [facility].  Will proceed with admission.  Patient getting fluid bolus, acetaminophen for headache and fever control, morphine for pain control and IV Rocephin.  Dr. Denton Lank was consulted, evaluated this patient with me and agrees with the plan.     Dahlia Client Tashawna Thom, PA-C 06/25/13 2232

## 2013-06-25 NOTE — ED Notes (Signed)
RECEIVED BED ASSIGNMENT@2327 

## 2013-06-25 NOTE — H&P (Signed)
PCP:   Kari Baars, MD   Chief Complaint:  Abdominal pain with anorexia  HPI: This is a 77 year old Caucasian female who lives at home independently along with her husband, who has a past medical history significant for GERD managed with omeprazole which is her only prescription medication at this time, complicated by history of some visual abnormalities which are mild followed by ophthalmology on vitamin therapy, she does have a significant history of lumbar spine issues status post surgery as well as headaches thought to be emanating from the cervical spine being followed by chiropractor at this time. Minimal oral medications used for the same. She does have a history of her recent rectocele repair approximately 3-4 months ago with complete resolution of any symptomatology thereof. She presents with a 2-3 day history of general malaise, worsening anorexia but no significant nausea or vomiting, and urinary retention with dysuria. Minimal urine output which is cloudy, but denies any gross hematuria. She states that she does have some vague abdominal discomfort in bilateral lower cautery once, especially right flank pain that prevented her from sleeping the previous night. Headache is also ensued during the same time. Without fevers or chills chest pain shortness of breath palpitations presyncope focal weakness dizziness or change in bowel habits. She presents to the emergency room given her significant discomfort, and in route did develop fevers and chills and was found to have a temperature 102F. She was given IV narcotics, IV fluids, Tylenol with some resolution of her pain and discomfort and is feeling much better at this time, and is now being admitted after administration of Rocephin in the emergency room for present pyelonephritis given abnormal urinalysis and leukocytosis in excess of 15,000.  Review of Systems:  Positive for fever and chills in route to the emergency room but none within the  last 48 hours per patient report, positive for headaches but negative for any new visual abnormalities, focal neurologic deficits, difficulty swallowing status post dilatation in the past by Dr. Marina Goodell, denies shortness of breath, bronchospasm, cough, shortness of breath, chest pain, palpitations, presyncope positive for some nausea but more anorexia, but negative for any change in bowel habits or blood in stool or urine. Patient states that she has not been able to urinate for some retention, questionable dish area but no gross hematuria, urine output has been somewhat cloudy, patient has never had symptoms like this prior to the last 48 hours. Denies lower extremity edema, rashes, worsening joint difficulties but positive for headaches as mentioned above  Past Medical History: Past Medical History  Diagnosis Date  . Lacunar infarction   . Hyperlipidemia   . History of colonic polyps   . Herniated disc   . Common migraine   . Acid reflux disease   . Diverticulosis   . Esophageal stricture   . Hiatal hernia 2009    EGD-Small    Past Surgical History  Procedure Laterality Date  . Total abdominal hysterectomy    . Inguinal hernia repair      rt.  . Cholecystectomy      4-5 YRS AGO.  [facility] surgery      BILATERAL CATARAT  . Rectocele repair N/A 02/13/2013    Procedure: POSTERIOR REPAIR (RECTOCELE);  Surgeon: Allie Bossier, MD;  Location: [facility];  Service: Gynecology;  Laterality: N/A;    Medications: Prior to Admission medications   Medication Sig Start Date End Date Taking? Authorizing Provider  aspirin EC 81 MG tablet Take 81 mg by mouth  every morning.    Yes Historical Provider, MD  Biotin 1 MG CAPS Take 1 capsule by mouth every morning.    Yes Historical Provider, MD  cholecalciferol (VITAMIN D) 1000 UNITS tablet Take 1,000 Units by mouth every morning.   Yes Historical Provider, MD  fish oil-omega-3 fatty acids 1000 MG capsule Take 1 g by mouth daily.   Yes Historical Provider, MD   Multiple Vitamins-Minerals (CENTRUM SILVER ADULT 50+) TABS Take 1 tablet by mouth every morning.   Yes Historical Provider, MD  Multiple Vitamins-Minerals (VISION-VITE PRESERVE PO) Take 1 capsule by mouth 2 (two) times daily.    Yes Historical Provider, MD  omeprazole (PRILOSEC) 20 MG capsule Take 20 mg by mouth every morning.    Yes Historical Provider, MD    Allergies:   Allergies  Allergen Reactions  . Isometheptene-Apap-Dichloral Nausea Only    (midrin)  . Oxycodone Hcl Nausea And Vomiting    Pt reports tolerating vicodin    Social History:  reports that she has never smoked. She has never used smokeless tobacco. She reports that she does not drink alcohol or use illicit drugs. Lives independently with her husband  Family History: Family History  Problem Relation Age of Onset  . Cancer Brother     kidney cancer  . Aneurysm Sister 37    died   . Breast cancer Maternal Aunt   . Breast cancer Paternal Aunt   . Colon cancer Neg Hx     Physical Exam: Filed Vitals:   06/25/13 2050 06/25/13 2126 06/25/13 2246  BP: 161/80  115/54  Pulse: 125  101  Temp: 99.4 F (37.4 C) 101.9 F (38.8 C) 100.6 F (38.1 C)  TempSrc:  Oral Oral  Resp: 20  18  SpO2: 98%  99%   No apparent distress lying flat in bed answering all questions appropriately after administration of morphine and IV fluids as well as first dose of Rocephin Sclera anicteric extraconal movements are intact face is symmetric is no oropharyngeal lesions tongue is midline There's no cervical lymphadenopathy no thyromegaly Lungs clear to auscultation bilaterally without respiratory distress or accessory muscle usage, there is no axillary lymphadenopathy Cardiovascular is regular rate and rhythm with out murmurs rubs or gallops appreciated Abdomen is soft, nondistended, bowel sounds are present, minimal subjective tenderness in lower quadrants as well as suprapubic area without rebound or guarding There is CVA  tenderness right greater than left that is mild There is no spinal tenderness There is no edema, cyanosis, pedal pulses are intact Neurologic exam grossly nonfocal, able to move all 4 extremities, no tremors, resting tone intact, light touch intact in all 4 extremities, muscle strength grossly symmetrical Alert and oriented x3, appropriate   Labs on Admission:   Recent Labs  06/25/13 2124  NA 134*  K 3.3*  CL 96  CO2 25  GLUCOSE 131*  BUN 10  CREATININE 0.74  CALCIUM 9.3    Recent Labs  06/25/13 2124  AST 17  ALT 13  ALKPHOS 97  BILITOT 0.7  PROT 7.6  ALBUMIN 3.8   No results found for this basename: LIPASE, AMYLASE,  in the last 72 hours  Recent Labs  06/25/13 2124  WBC 15.6*  HGB 14.6  HCT 43.9  MCV 81.1  PLT 252   No results found for this basename: CKTOTAL, CKMB, CKMBINDEX, TROPONINI,  in the last 72 hours No results found for this basename: TSH, T4TOTAL, FREET3, T3FREE, THYROIDAB,  in the last 72 hours No  results found for this basename: VITAMINB12, FOLATE, FERRITIN, TIBC, IRON, RETICCTPCT,  in the last 72 hours  Radiological Exams on Admission: No results found. Orders placed during the hospital encounter of 02/13/13  . EKG 12-LEAD  . EKG 12-LEAD  . EKG    Assessment/Plan Presumed pyelonephritis-based on abnormal urinalysis along with high fever and leukocytosis-Rocephin  initiated during emergency room visit, patient hemodynamically stable, mild tachycardia has resolved with IV fluids and Tylenol as well as pain management, we'll followup on culture and sensitivity, followup on renal parameters electrolytes as well as leukocytosis with morning labs. GERD-long-standing issue, we'll continue proton pump inhibitor per hospital formulary Abdominal pain-presumably secondary to pyelonephritis, if symptoms progress, or do not resolve, we'll consider CT scan Headache-improved with pain management, no focal neurologic deficits, characteristics of headache or  not different from typical headaches per patient report, thought to be emanating cervical spine managed by chiropractor Hyponatremia-we'll treat with saline, and followup with morning labs. DVT prophylaxis  Bastien Strawser R 06/25/2013, 11:13 PM

## 2013-06-25 NOTE — ED Notes (Addendum)
25cc urine from in and out cath

## 2013-06-25 NOTE — ED Notes (Signed)
Pt c/o lower abd pain/pressure; urinary frequency/pain; lower back pain

## 2013-06-25 NOTE — ED Notes (Signed)
Pt states has had minimal urinary output since Friday night

## 2013-06-26 ENCOUNTER — Encounter (HOSPITAL_COMMUNITY): Payer: Self-pay | Admitting: *Deleted

## 2013-06-26 DIAGNOSIS — N10 Acute tubulo-interstitial nephritis: Secondary | ICD-10-CM | POA: Diagnosis present

## 2013-06-26 DIAGNOSIS — IMO0002 Reserved for concepts with insufficient information to code with codable children: Secondary | ICD-10-CM | POA: Diagnosis present

## 2013-06-26 LAB — COMPREHENSIVE METABOLIC PANEL
ALT: 22 U/L (ref 0–35)
AST: 37 U/L (ref 0–37)
Albumin: 2.7 g/dL — ABNORMAL LOW (ref 3.5–5.2)
Alkaline Phosphatase: 87 U/L (ref 39–117)
BUN: 10 mg/dL (ref 6–23)
CO2: 26 mEq/L (ref 19–32)
Calcium: 8.1 mg/dL — ABNORMAL LOW (ref 8.4–10.5)
Chloride: 102 mEq/L (ref 96–112)
Creatinine, Ser: 0.65 mg/dL (ref 0.50–1.10)
GFR calc Af Amer: >90 mL/min (ref >90)
GFR calc non Af Amer: 85 mL/min — ABNORMAL LOW (ref >90)
Glucose, Bld: 128 mg/dL — ABNORMAL HIGH (ref 70–99)
Potassium: 3.2 mEq/L — ABNORMAL LOW (ref 3.5–5.1)
Sodium: 135 mEq/L (ref 135–145)
Total Bilirubin: 0.5 mg/dL (ref 0.3–1.2)
Total Protein: 5.8 g/dL — ABNORMAL LOW (ref 6.0–8.3)

## 2013-06-26 LAB — CBC
HCT: 36.6 % (ref 36.0–46.0)
Hemoglobin: 12.2 g/dL (ref 12.0–15.0)
MCH: 27.2 pg (ref 26.0–34.0)
MCHC: 33.3 g/dL (ref 30.0–36.0)
MCV: 81.7 fL (ref 78.0–100.0)
Platelets: 202 10*3/uL (ref 150–400)
RBC: 4.48 MIL/uL (ref 3.87–5.11)
RDW: 14.5 % (ref 11.5–15.5)
WBC: 13.3 10*3/uL — ABNORMAL HIGH (ref 4.0–10.5)

## 2013-06-26 MED ORDER — POTASSIUM CHLORIDE CRYS ER 20 MEQ PO TBCR
40.0000 meq | EXTENDED_RELEASE_TABLET | Freq: Once | ORAL | Status: AC
  Administered 2013-06-26: 40 meq via ORAL
  Filled 2013-06-26: qty 2

## 2013-06-26 MED ORDER — POTASSIUM CHLORIDE IN NACL 20-0.9 MEQ/L-% IV SOLN
INTRAVENOUS | Status: DC
  Administered 2013-06-26 (×2): via INTRAVENOUS
  Filled 2013-06-26 (×3): qty 1000

## 2013-06-26 MED ORDER — ASPIRIN EC 81 MG PO TBEC
81.0000 mg | DELAYED_RELEASE_TABLET | Freq: Every morning | ORAL | Status: DC
  Administered 2013-06-26 – 2013-06-27 (×2): 81 mg via ORAL
  Filled 2013-06-26 (×3): qty 1

## 2013-06-26 MED ORDER — MORPHINE SULFATE 2 MG/ML IJ SOLN
2.0000 mg | INTRAMUSCULAR | Status: DC | PRN
  Administered 2013-06-26 (×4): 2 mg via INTRAVENOUS
  Filled 2013-06-26 (×4): qty 1

## 2013-06-26 MED ORDER — DEXTROSE 5 % IV SOLN
1.0000 g | INTRAVENOUS | Status: DC
  Administered 2013-06-26: 1 g via INTRAVENOUS
  Filled 2013-06-26: qty 10

## 2013-06-26 MED ORDER — PANTOPRAZOLE SODIUM 40 MG PO TBEC
40.0000 mg | DELAYED_RELEASE_TABLET | Freq: Every day | ORAL | Status: DC
  Administered 2013-06-26 – 2013-06-27 (×2): 40 mg via ORAL
  Filled 2013-06-26 (×2): qty 1

## 2013-06-26 MED ORDER — ACETAMINOPHEN 650 MG RE SUPP: 650.0000 mg | Freq: Four times a day (QID) | RECTAL | Status: DC | PRN

## 2013-06-26 MED ORDER — ONDANSETRON HCL 4 MG/2ML IJ SOLN: 4.0000 mg | Freq: Four times a day (QID) | INTRAMUSCULAR | Status: DC | PRN

## 2013-06-26 MED ORDER — ACETAMINOPHEN 325 MG PO TABS
650.0000 mg | ORAL_TABLET | Freq: Four times a day (QID) | ORAL | Status: DC | PRN
  Administered 2013-06-26: 650 mg via ORAL
  Filled 2013-06-26: qty 2

## 2013-06-26 MED ORDER — ENOXAPARIN SODIUM 40 MG/0.4ML ~~LOC~~ SOLN
40.0000 mg | SUBCUTANEOUS | Status: DC
  Administered 2013-06-26 – 2013-06-27 (×2): 40 mg via SUBCUTANEOUS
  Filled 2013-06-26 (×2): qty 0.4

## 2013-06-26 NOTE — Care Management Note (Signed)
    Page 1 of 1   06/26/2013     1:57:35 PM   CARE MANAGEMENT NOTE 06/26/2013  Patient:  [name] [name], [name] [name]   Account Number:  [id number]  Date Initiated:  06/26/2013  Documentation initiated by:  Colleen Can  Subjective/Objective Assessment:   dx sepsis syndrome 2nd to acute pyelonephritis     Action/Plan:   CM spoke with patient and spouse. Plans are for patient to return to her home in [place] where spouse will be caregiver. She states she is able to ambulate with assisted to BR and will not need RW or cane. No HH needs assessed at this.   Anticipated DC Date:  06/27/2013   Anticipated DC Plan:  HOME/SELF CARE      DC Planning Services  CM consult      Choice offered to / List presented to:             Status of service:  Completed, signed off Medicare Important Message given?  NA - LOS <3 / Initial given by admissions (If response is "NO", the following Medicare IM given date fields will be blank) Date Medicare IM given:   Date Additional Medicare IM given:    Discharge Disposition:    Per UR Regulation:  Reviewed for med. necessity/level of care/duration of stay  If discussed at Long Length of Stay Meetings, dates discussed:    Comments:

## 2013-06-26 NOTE — Progress Notes (Signed)
Subjective: History reviewed with patient.  Feels a little better.  Persistent headache (acute on chronic issue).  No neck stiffness.  Urinating better without dysuria.   Objective: Vital signs in last 24 hours: Temp:  [97.6 F (36.4 C)-101.9 F (38.8 C)] 97.6 F (36.4 C) (07/28 0545) Pulse Rate:  [85-125] 87 (07/28 0545) Resp:  [18-20] 18 (07/28 0545) BP: (105-161)/(51-80) 125/58 mmHg (07/28 0545) SpO2:  [96 %-99 %] 99 % (07/28 0545) Weight:  [64.9 kg (143 lb 1.3 oz)] 64.9 kg (143 lb 1.3 oz) (07/28 0030) Weight change:  Last BM Date: 06/25/13  CBG (last 3)  No results found for this basename: GLUCAP,  in the last 72 hours  Intake/Output from previous day: 07/27 0701 - 07/28 0700 In: 166.7 [I.V.:166.7] Out: 450 [Urine:450] Intake/Output this shift: Total I/O In: 166.7 [I.V.:166.7] Out: 450 [Urine:450]  General appearance: alert and fatigued Neck: no nuchal rigidity Eyes: no scleral icterus Throat: oropharynx moist without erythema Resp: clear to auscultation bilaterally Cardio: regular rate and rhythm GI: soft, non-tender; bowel sounds normal; no masses,  no organomegaly Extremities: no clubbing, cyanosis or edema Skin: no rash  Lab Results:  Recent Labs  06/25/13 2124 06/26/13 0350  NA 134* 135  K 3.3* 3.2*  CL 96 102  CO2 25 26  GLUCOSE 131* 128*  BUN 10 10  CREATININE 0.74 0.65  CALCIUM 9.3 8.1*    Recent Labs  06/25/13 2124 06/26/13 0350  AST 17 37  ALT 13 22  ALKPHOS 97 87  BILITOT 0.7 0.5  PROT 7.6 5.8*  ALBUMIN 3.8 2.7*    Recent Labs  06/25/13 2124 06/26/13 0350  WBC 15.6* 13.3*  HGB 14.6 12.2  HCT 43.9 36.6  MCV 81.1 81.7  PLT 252 202   Blood Culture No results found for this basename: sdes, specrequest, cult, reptstatus   urine cultures- pending  Studies/Results: No results found.   Medications: Scheduled: . aspirin EC  81 mg Oral q morning - 10a  . cefTRIAXone (ROCEPHIN)  IV  1 g Intravenous Q24H  . enoxaparin  (LOVENOX) injection  40 mg Subcutaneous Q24H  . pantoprazole  40 mg Oral Daily   Continuous: . 0.9 % NaCl with KCl 20 mEq / L 50 mL/hr at 06/26/13 0240    Assessment/Plan: Principal Problem: 1. Sepsis syndrome secondary to   Acute pyelonephritis- Tachycardia, leukocytosis, fever all consistent with sepsis from presumed urinary source.  Continue IV antibiotics and IV fluids for 24 hours, then transition to oral antibiotics pending culture data.  Obtain blood cultures/urine cultures pending.    Active Problems: 2. Headache(784.0)- chronic headaches recently improved with chiropractic care.  Increased with acute illness.  Continue tylenol, MSO4 as needed.  No rash or tick exposure to suggest RMSF.  No nuchal rigidity. 3. Disposition- anticipate discharge tomorrow if stable.   LOS: 1 day   Le Faulcon,W DOUGLAS 06/26/2013, 6:54 AM

## 2013-06-26 NOTE — Progress Notes (Signed)
Cardiac monitoring discontinued per Dr Felipa Eth. Patient to remain on medical floor

## 2013-06-27 LAB — BASIC METABOLIC PANEL
BUN: 6 mg/dL (ref 6–23)
CO2: 25 mEq/L (ref 19–32)
Calcium: 8.4 mg/dL (ref 8.4–10.5)
Chloride: 106 mEq/L (ref 96–112)
Creatinine, Ser: 0.61 mg/dL (ref 0.50–1.10)
GFR calc Af Amer: >90 mL/min (ref >90)
GFR calc non Af Amer: 87 mL/min — ABNORMAL LOW (ref >90)
Glucose, Bld: 95 mg/dL (ref 70–99)
Potassium: 3.8 mEq/L (ref 3.5–5.1)
Sodium: 138 mEq/L (ref 135–145)

## 2013-06-27 LAB — CBC
HCT: 36.6 % (ref 36.0–46.0)
Hemoglobin: 11.9 g/dL — ABNORMAL LOW (ref 12.0–15.0)
MCH: 26.8 pg (ref 26.0–34.0)
MCHC: 32.5 g/dL (ref 30.0–36.0)
MCV: 82.4 fL (ref 78.0–100.0)
Platelets: 211 10*3/uL (ref 150–400)
RBC: 4.44 MIL/uL (ref 3.87–5.11)
RDW: 14.7 % (ref 11.5–15.5)
WBC: 10.2 10*3/uL (ref 4.0–10.5)

## 2013-06-27 LAB — URINE CULTURE: Colony Count: >=100000

## 2013-06-27 MED ORDER — CIPROFLOXACIN HCL 500 MG PO TABS: 500.0000 mg | ORAL_TABLET | Freq: Two times a day (BID) | ORAL | Status: AC

## 2013-06-27 NOTE — ED Provider Notes (Signed)
Medical screening examination/treatment/procedure(s) were conducted as a shared visit with non-physician practitioner(s) and myself.  I personally evaluated the patient during the encounter Pt c/o right flank pain, urinary urgency, fevers.  Iv ns bolus. Labs. ua pos, rocephin iv. abd soft nt.   Suzi Roots, MD 06/27/13 [phone]

## 2013-06-27 NOTE — Discharge Summary (Signed)
DISCHARGE SUMMARY  [name] [name]  MR#: [id number]  DOB:Sep 20, 1936  Date of Admission: 06/25/2013 Date of Discharge: 06/27/2013  Attending Physician:Kacee Koren,W DOUGLAS  Patient's KGM:WNUU,V Riley Lam, MD  Consults:  None  Discharge Diagnoses: Principal Problem:   Sepsis syndrome Active Problems:  Acute pyelonephritis  Headache(784.0)    Past Medical History  Diagnosis Date  . Lacunar infarction   . Hyperlipidemia   . History of colonic polyps   . Herniated disc   . Common migraine   . Acid reflux disease   . Diverticulosis   . Esophageal stricture   . Hiatal hernia 2009    EGD-Small     Past Surgical History  Procedure Laterality Date  . Total abdominal hysterectomy    . Inguinal hernia repair      rt.  . Cholecystectomy      4-5 YRS AGO.  [facility] surgery      BILATERAL CATARAT  . Rectocele repair N/A 02/13/2013    Procedure: POSTERIOR REPAIR (RECTOCELE);  Surgeon: Allie Bossier, MD;  Location: [facility];  Service: Gynecology;  Laterality: N/A;     Discharge Medications:   Medication List         aspirin EC 81 MG tablet  Take 81 mg by mouth every morning.     Biotin 1 MG Caps  Take 1 capsule by mouth every morning.     cholecalciferol 1000 UNITS tablet  Commonly known as:  VITAMIN D  Take 1,000 Units by mouth every morning.     ciprofloxacin 500 MG tablet  Commonly known as:  CIPRO  Take 1 tablet (500 mg total) by mouth 2 (two) times daily.     fish oil-omega-3 fatty acids 1000 MG capsule  Take 1 g by mouth daily.     omeprazole 20 MG capsule  Commonly known as:  PRILOSEC  Take 20 mg by mouth every morning.     VISION-VITE PRESERVE PO  Take 1 capsule by mouth 2 (two) times daily.     CENTRUM SILVER ADULT 50+ Tabs  Take 1 tablet by mouth every morning.        Hospital Procedures: No results found.  History of Present Illness: This is a 77 year old Caucasian female who lives at home independently along with her husband, who has a past medical  history significant for GERD managed with omeprazole which is her only prescription medication at this time, complicated by history of some visual abnormalities which are mild followed by ophthalmology on vitamin therapy, she does have a significant history of lumbar spine issues status post surgery as well as headaches thought to be emanating from the cervical spine being followed by chiropractor at this time. Minimal oral medications used for the same. She does have a history of her recent rectocele repair approximately 3-4 months ago with complete resolution of any symptomatology thereof.  She presents with a 2-3 day history of general malaise, worsening anorexia but no significant nausea or vomiting, and urinary retention with dysuria. Minimal urine output which is cloudy, but denies any gross hematuria. She states that she does have some vague abdominal discomfort in bilateral lower cautery once, especially right flank pain that prevented her from sleeping the previous night. Headache is also ensued during the same time. Without fevers or chills chest pain shortness of breath palpitations presyncope focal weakness dizziness or change in bowel habits.  She presents to the emergency room given her significant discomfort, and in route did develop fevers and chills and was found  to have a temperature 102F. She was given IV narcotics, IV fluids, Tylenol with some resolution of her pain and discomfort and is feeling much better at this time, and is now being admitted after administration of Rocephin in the emergency room for present pyelonephritis given abnormal urinalysis and leukocytosis in excess of 15,000.   Hospital Course: Mrs. Kosta was admitted to a medical bed for management of sepsis secondary to acute pyelonephritis.  She was treated empirically with Rocephin IV and hydrated with IV fluids. With this, her tachycardia, fever, and leukocytosis improved. Urine cultures were obtained and are growing  Escherichia coli with sensitivities pending at this time. She will be transitioned to oral Cipro to complete a seven-day course. She reports normalization in her urination, resolution of her headache. She continues to have minimal right flank pain which has improved. She is stable for discharge home to complete her antibiotic treatment. Will follow up on her urine cultures to ensure that they are sensitive to Cipro.  Day of Discharge Exam BP 127/65  Pulse 86  Temp(Src) 98.2 F (36.8 C) (Oral)  Resp 18  Ht 5\' 4"  (1.626 m)  Wt 64.9 kg (143 lb 1.3 oz)  BMI 24.55 kg/m2  SpO2 97%  Physical Exam: General appearance: alert and no distress Eyes: no scleral icterus Throat: oropharynx moist without erythema Resp: clear to auscultation bilaterally Cardio: regular rate and rhythm GI: soft, non-tender; bowel sounds normal; no masses,  no organomegaly Extremities: no clubbing, cyanosis or edema  Discharge Labs:  Recent Labs  06/26/13 0350 06/27/13 0350  NA 135 138  K 3.2* 3.8  CL 102 106  CO2 26 25  GLUCOSE 128* 95  BUN 10 6  CREATININE 0.65 0.61  CALCIUM 8.1* 8.4    Recent Labs  06/25/13 2124 06/26/13 0350  AST 17 37  ALT 13 22  ALKPHOS 97 87  BILITOT 0.7 0.5  PROT 7.6 5.8*  ALBUMIN 3.8 2.7*    Recent Labs  06/26/13 0350 06/27/13 0350  WBC 13.3* 10.2  HGB 12.2 11.9*  HCT 36.6 36.6  MCV 81.7 82.4  PLT 202 211   Urine Culture >100,000 E.coli, sensitivities pending Blood cultures- pending  Discharge instructions:     Discharge Orders   Future Orders Complete By Expires     Diet general  As directed     Discharge instructions  As directed     Comments:      Call if fever above 101.5, increased flank pain, or inability to keep antibiotics down    Increase activity slowly  As directed        Disposition: to home  Follow-up Appts: Follow-up with Dr. Clelia Croft at [facility] in 2 weeks.  Condition on Discharge: stable/improved  Tests Needing  Follow-up: urine cultures  Signed: Chitara Clonch,W DOUGLAS 06/27/2013, 6:26 AM

## 2013-06-27 NOTE — Progress Notes (Signed)
Discharge summary sent to payer through [facility]  

## 2013-06-27 NOTE — Progress Notes (Signed)
Pt to d/c home. AVS reviewed and "My Chart" discussed with pt. Pt capable of verbalizing medications and follow-up appointments. Remains hemodynamically stable. No signs and symptoms of distress. Educated pt to return to ER in the case of SOB, dizziness, or chest pain.  

## 2013-07-02 LAB — CULTURE, BLOOD (ROUTINE X 2)
Culture: NO GROWTH
Culture: NO GROWTH

## 2013-07-08 ENCOUNTER — Other Ambulatory Visit: Payer: Self-pay | Admitting: Internal Medicine

## 2013-11-01 ENCOUNTER — Ambulatory Visit: Payer: Medicare PPO | Attending: Ophthalmology | Admitting: Occupational Therapy

## 2013-11-01 DIAGNOSIS — IMO0001 Reserved for inherently not codable concepts without codable children: Secondary | ICD-10-CM | POA: Insufficient documentation

## 2013-11-01 DIAGNOSIS — H53419 Scotoma involving central area, unspecified eye: Secondary | ICD-10-CM | POA: Insufficient documentation

## 2013-11-09 ENCOUNTER — Other Ambulatory Visit: Payer: Self-pay | Admitting: Internal Medicine

## 2013-12-20 ENCOUNTER — Other Ambulatory Visit: Payer: Self-pay | Admitting: Internal Medicine

## 2013-12-21 ENCOUNTER — Other Ambulatory Visit (HOSPITAL_COMMUNITY): Payer: Self-pay | Admitting: Internal Medicine

## 2013-12-21 ENCOUNTER — Telehealth: Payer: Self-pay

## 2013-12-21 DIAGNOSIS — Z0000 Encounter for general adult medical examination without abnormal findings: Secondary | ICD-10-CM

## 2013-12-21 MED ORDER — OMEPRAZOLE 20 MG PO CPDR: 20.0000 mg | DELAYED_RELEASE_CAPSULE | Freq: Two times a day (BID) | ORAL | Status: DC

## 2013-12-21 NOTE — Telephone Encounter (Signed)
Patient cannot come in for office visit until April.  I agreed to refill her Omeprazole until that time when she will need to come in

## 2013-12-28 ENCOUNTER — Ambulatory Visit (HOSPITAL_COMMUNITY)
Admission: RE | Admit: 2013-12-28 | Discharge: 2013-12-28 | Disposition: A | Discharge status: Home / Self Care | Payer: Medicare PPO | Source: Ambulatory Visit | Attending: Internal Medicine | Admitting: Internal Medicine

## 2013-12-28 ENCOUNTER — Other Ambulatory Visit (HOSPITAL_COMMUNITY): Payer: Self-pay | Admitting: Internal Medicine

## 2013-12-28 DIAGNOSIS — Z0000 Encounter for general adult medical examination without abnormal findings: Secondary | ICD-10-CM

## 2013-12-28 DIAGNOSIS — Z1231 Encounter for screening mammogram for malignant neoplasm of breast: Secondary | ICD-10-CM

## 2014-01-02 ENCOUNTER — Encounter: Payer: Self-pay | Admitting: Internal Medicine

## 2014-02-21 ENCOUNTER — Ambulatory Visit (AMBULATORY_SURGERY_CENTER): Payer: Self-pay

## 2014-02-21 VITALS — Ht 64.5 in | Wt 150.4 lb

## 2014-02-21 DIAGNOSIS — Z860100 Personal history of colon polyps, unspecified: Secondary | ICD-10-CM

## 2014-02-21 MED ORDER — MOVIPREP 100 G PO SOLR: 1.0000 | Freq: Once | ORAL | Status: DC

## 2014-03-07 ENCOUNTER — Encounter: Payer: Self-pay | Admitting: Internal Medicine

## 2014-03-07 ENCOUNTER — Ambulatory Visit (AMBULATORY_SURGERY_CENTER): Payer: Medicare PPO | Admitting: Internal Medicine

## 2014-03-07 VITALS — BP 125/83 | HR 76 | Temp 98.4°F | Resp 19 | Ht 64.5 in | Wt 150.0 lb

## 2014-03-07 DIAGNOSIS — Z860100 Personal history of colon polyps, unspecified: Secondary | ICD-10-CM

## 2014-03-07 DIAGNOSIS — K5730 Diverticulosis of large intestine without perforation or abscess without bleeding: Secondary | ICD-10-CM

## 2014-03-07 MED ORDER — SODIUM CHLORIDE 0.9 % IV SOLN: 500.0000 mL | INTRAVENOUS | Status: DC

## 2014-03-07 NOTE — Op Note (Signed)
[place]  [address]. [place], [address]   COLONOSCOPY PROCEDURE REPORT  PATIENT: [name], [name]  MR#: [id number] BIRTHDATE: [birth year] , 77  yrs. old GENDER: Female ENDOSCOPIST: Eustace Quail, MD REFERRED KT:[id number] Program Recall PROCEDURE DATE:  03/07/2014 PROCEDURE:   Colonoscopy, surveillance First Screening Colonoscopy - Avg.  risk and is 50 yrs.  old or older - No.  Prior Negative Screening - Now for repeat screening. N/A  History of Adenoma - Now for follow-up colonoscopy & has been > or = to 3 yrs.  Yes hx of adenoma.  Has been 3 or more years since last colonoscopy.  Polyps Removed Today? No.  Recommend repeat exam, <10 yrs? No. ASA CLASS:   Class II INDICATIONS:Patient's personal history of adenomatous colon polyps. [place] 2002; [place] 2009 (small polyp-no path). MEDICATIONS: MAC sedation, administered by CRNA and propofol (Diprivan) 250mg  IV  DESCRIPTION OF PROCEDURE:   After the risks benefits and alternatives of the procedure were thoroughly explained, informed consent was obtained.  A digital rectal exam revealed no abnormalities of the rectum.   The LB [id number] and LB [id number]  endoscope was introduced through the anus and advanced to the cecum, which was identified by both the appendix and ileocecal valve. No adverse events experienced.   The quality of the prep was excellent, using MoviPrep  The instrument was then slowly withdrawn as the colon was fully examined.   COLON FINDINGS: Severe diverticulosis with rectosigmoid stenosis (changed to pediatric scope) was noted in the sigmoid colon.   The colon was otherwise normal.  There was no inflammation, polyps or cancers or other relevant abnormalities..  Retroflexed views revealed no abnormalities. The time to cecum=9 minutes 30 seconds. Withdrawal time=8 minutes 32 seconds.  The scope was withdrawn and the procedure completed. COMPLICATIONS: There were  no complications.  ENDOSCOPIC IMPRESSION: 1.   Severe diverticulosis with stenosis was noted in the sigmoid colon 2.   The colon was otherwise normal  RECOMMENDATIONS: 1. Return to the care of your primary provider.  GI follow up as needed   eSigned:  Eustace Quail, MD 03/07/2014 10:17 AM   cc: Janalyn Rouse, MD and The Patient

## 2014-03-07 NOTE — Progress Notes (Signed)
No complaints noted in the recovery room. maw 

## 2014-03-07 NOTE — Progress Notes (Signed)
Lidocaine-40mg IV prior to Propofol InductionPropofol given over incremental dosages 

## 2014-03-07 NOTE — Patient Instructions (Signed)
YOU HAD AN ENDOSCOPIC PROCEDURE TODAY AT THE [facility]: Refer to the procedure report that was given to you for any specific questions about what was found during the examination.  If the procedure report does not answer your questions, please call your gastroenterologist to clarify.  If you requested that your care partner not be given the details of your procedure findings, then the procedure report has been included in a sealed envelope for you to review at your convenience later.  YOU SHOULD EXPECT: Some feelings of bloating in the abdomen. Passage of more gas than usual.  Walking can help get rid of the air that was put into your GI tract during the procedure and reduce the bloating. If you had a lower endoscopy (such as a colonoscopy or flexible sigmoidoscopy) you may notice spotting of blood in your stool or on the toilet paper. If you underwent a bowel prep for your procedure, then you may not have a normal bowel movement for a few days.  DIET: Your first meal following the procedure should be a light meal and then it is ok to progress to your normal diet.  A half-sandwich or bowl of soup is an example of a good first meal.  Heavy or fried foods are harder to digest and may make you feel nauseous or bloated.  Likewise meals heavy in dairy and vegetables can cause extra gas to form and this can also increase the bloating.  Drink plenty of fluids but you should avoid alcoholic beverages for 24 hours.  ACTIVITY: Your care partner should take you home directly after the procedure.  You should plan to take it easy, moving slowly for the rest of the day.  You can resume normal activity the day after the procedure however you should NOT DRIVE or use heavy machinery for 24 hours (because of the sedation medicines used during the test).    SYMPTOMS TO REPORT IMMEDIATELY: A gastroenterologist can be reached at any hour.  During normal business hours, 8:30 AM to 5:00 PM Monday through Friday,  call [phone].  After hours and on weekends, please call the GI answering service at [phone] who will take a message and have the physician on call contact you.   Following lower endoscopy (colonoscopy or flexible sigmoidoscopy):  Excessive amounts of blood in the stool  Significant tenderness or worsening of abdominal pains  Swelling of the abdomen that is new, acute  Fever of 100F or higher   FOLLOW UP: If any biopsies were taken you will be contacted by phone or by letter within the next 1-3 weeks.  Call your gastroenterologist if you have not heard about the biopsies in 3 weeks.  Our staff will call the home number listed on your records the next business day following your procedure to check on you and address any questions or concerns that you may have at that time regarding the information given to you following your procedure. This is a courtesy call and so if there is no answer at the home number and we have not heard from you through the emergency physician on call, we will assume that you have returned to your regular daily activities without incident.  SIGNATURES/CONFIDENTIALITY: You and/or your care partner have signed paperwork which will be entered into your electronic medical record.  These signatures attest to the fact that that the information above on your After Visit Summary has been reviewed and is understood.  Full responsibility of the confidentiality of   this discharge information lies with you and/or your care-partner.    Handouts were given to your husband on diverticulosis and a high fiber diet with liberal fluid intake. You may resume your current medications today. Please call if any questions or concerns.

## 2014-03-08 ENCOUNTER — Telehealth: Payer: Self-pay | Admitting: *Deleted

## 2014-03-08 NOTE — Telephone Encounter (Signed)
  Follow up Call-  Call back number 03/07/2014  Post procedure Call Back phone  # [phone]  Permission to leave phone message Yes     Patient questions:  Do you have a fever, pain , or abdominal swelling? no Pain Score  0 *  Have you tolerated food without any problems? yes  Have you been able to return to your normal activities? yes  Do you have any questions about your discharge instructions: Diet   no Medications  no Follow up visit  no  Do you have questions or concerns about your Care? no  Actions: * If pain score is 4 or above: No action needed, pain <4.

## 2014-03-14 ENCOUNTER — Encounter: Payer: Self-pay | Admitting: Obstetrics & Gynecology

## 2014-03-14 ENCOUNTER — Ambulatory Visit (INDEPENDENT_AMBULATORY_CARE_PROVIDER_SITE_OTHER): Payer: Medicare PPO | Admitting: Obstetrics & Gynecology

## 2014-03-14 VITALS — BP 128/80 | HR 100 | Resp 16 | Ht 64.0 in | Wt 150.0 lb

## 2014-03-14 DIAGNOSIS — IMO0002 Reserved for concepts with insufficient information to code with codable children: Secondary | ICD-10-CM

## 2014-03-14 DIAGNOSIS — N8111 Cystocele, midline: Secondary | ICD-10-CM

## 2014-03-14 MED ORDER — ESTRADIOL 0.1 MG/GM VA CREA: TOPICAL_CREAM | VAGINAL | Status: DC

## 2014-03-14 NOTE — Progress Notes (Signed)
   Subjective:    Patient ID: [name] [name], female    DOB: [birth year], 78 y.o.   MRN: [id number]  HPI  [name] [name] is here today with 2 concerns. The first is that she thinks that her bladder may have fallen back down. She noticed this in the shower recently. She denies discomfort currently. She does lots of lifting and pushing activities. She would also like a refill of her vaginal estrogen and wants something to increase her libido.  Review of Systems     Objective:   Physical Exam  Severe vulvar and vaginal atrophy 2nd degree cystocele      Assessment & Plan:  Cystocele- rec vaginal estrogen cream QOnight and I will place a pessary if this condition becomes bothersome to her. Decreased libido- 2% testosterone QOD

## 2014-06-29 ENCOUNTER — Other Ambulatory Visit: Payer: Self-pay | Admitting: Internal Medicine

## 2014-07-28 ENCOUNTER — Encounter: Payer: Self-pay | Admitting: Internal Medicine

## 2014-09-17 ENCOUNTER — Other Ambulatory Visit: Payer: Self-pay | Admitting: Internal Medicine

## 2014-10-01 ENCOUNTER — Encounter: Payer: Self-pay | Admitting: Obstetrics & Gynecology

## 2014-10-08 ENCOUNTER — Observation Stay (HOSPITAL_COMMUNITY)
Admission: EM | Admit: 2014-10-08 | Discharge: 2014-10-09 | Disposition: A | Discharge status: Home / Self Care | Payer: Medicare PPO | Attending: Cardiovascular Disease | Admitting: Cardiovascular Disease

## 2014-10-08 ENCOUNTER — Other Ambulatory Visit: Payer: Self-pay

## 2014-10-08 ENCOUNTER — Encounter (HOSPITAL_COMMUNITY): Payer: Self-pay

## 2014-10-08 ENCOUNTER — Emergency Department (HOSPITAL_COMMUNITY): Payer: Medicare PPO

## 2014-10-08 DIAGNOSIS — K222 Esophageal obstruction: Secondary | ICD-10-CM | POA: Diagnosis not present

## 2014-10-08 DIAGNOSIS — Z79899 Other long term (current) drug therapy: Secondary | ICD-10-CM | POA: Insufficient documentation

## 2014-10-08 DIAGNOSIS — G4733 Obstructive sleep apnea (adult) (pediatric): Secondary | ICD-10-CM

## 2014-10-08 DIAGNOSIS — G43909 Migraine, unspecified, not intractable, without status migrainosus: Secondary | ICD-10-CM | POA: Insufficient documentation

## 2014-10-08 DIAGNOSIS — K449 Diaphragmatic hernia without obstruction or gangrene: Secondary | ICD-10-CM | POA: Insufficient documentation

## 2014-10-08 DIAGNOSIS — I639 Cerebral infarction, unspecified: Secondary | ICD-10-CM | POA: Diagnosis not present

## 2014-10-08 DIAGNOSIS — R079 Chest pain, unspecified: Secondary | ICD-10-CM | POA: Diagnosis present

## 2014-10-08 DIAGNOSIS — K5790 Diverticulosis of intestine, part unspecified, without perforation or abscess without bleeding: Secondary | ICD-10-CM | POA: Insufficient documentation

## 2014-10-08 DIAGNOSIS — Z86010 Personal history of colonic polyps: Secondary | ICD-10-CM | POA: Diagnosis not present

## 2014-10-08 DIAGNOSIS — K219 Gastro-esophageal reflux disease without esophagitis: Secondary | ICD-10-CM | POA: Diagnosis not present

## 2014-10-08 DIAGNOSIS — E785 Hyperlipidemia, unspecified: Secondary | ICD-10-CM | POA: Diagnosis not present

## 2014-10-08 DIAGNOSIS — G43009 Migraine without aura, not intractable, without status migrainosus: Secondary | ICD-10-CM | POA: Diagnosis present

## 2014-10-08 DIAGNOSIS — Z87891 Personal history of nicotine dependence: Secondary | ICD-10-CM | POA: Diagnosis not present

## 2014-10-08 LAB — BASIC METABOLIC PANEL
Anion gap: 14 (ref 5–15)
BUN: 15 mg/dL (ref 6–23)
CO2: 28 mEq/L (ref 19–32)
Calcium: 9.6 mg/dL (ref 8.4–10.5)
Chloride: 100 mEq/L (ref 96–112)
Creatinine, Ser: 0.66 mg/dL (ref 0.50–1.10)
GFR calc Af Amer: >90 mL/min (ref >90)
GFR calc non Af Amer: 84 mL/min — ABNORMAL LOW (ref >90)
Glucose, Bld: 91 mg/dL (ref 70–99)
Potassium: 3.8 mEq/L (ref 3.7–5.3)
Sodium: 142 mEq/L (ref 137–147)

## 2014-10-08 LAB — HEPATIC FUNCTION PANEL
ALT: 15 U/L (ref 0–35)
AST: 21 U/L (ref 0–37)
Albumin: 4.3 g/dL (ref 3.5–5.2)
Alkaline Phosphatase: 77 U/L (ref 39–117)
Bilirubin, Direct: <0.2 mg/dL (ref 0.0–0.3)
Total Bilirubin: 0.3 mg/dL (ref 0.3–1.2)
Total Protein: 7.8 g/dL (ref 6.0–8.3)

## 2014-10-08 LAB — I-STAT TROPONIN, ED: Troponin i, poc: 0.01 ng/mL (ref 0.00–0.08)

## 2014-10-08 LAB — CBC
HCT: 42.6 % (ref 36.0–46.0)
Hemoglobin: 14.2 g/dL (ref 12.0–15.0)
MCH: 27.8 pg (ref 26.0–34.0)
MCHC: 33.3 g/dL (ref 30.0–36.0)
MCV: 83.5 fL (ref 78.0–100.0)
Platelets: 263 10*3/uL (ref 150–400)
RBC: 5.1 MIL/uL (ref 3.87–5.11)
RDW: 13.6 % (ref 11.5–15.5)
WBC: 11 10*3/uL — ABNORMAL HIGH (ref 4.0–10.5)

## 2014-10-08 LAB — TROPONIN I: Troponin I: <0.3 ng/mL (ref <0.30)

## 2014-10-08 LAB — PRO B NATRIURETIC PEPTIDE: Pro B Natriuretic peptide (BNP): 148.2 pg/mL (ref 0–450)

## 2014-10-08 LAB — LIPASE, BLOOD: Lipase: 18 U/L (ref 11–59)

## 2014-10-08 MED ORDER — METOPROLOL TARTRATE 1 MG/ML IV SOLN: 2.5000 mg | Freq: Once | INTRAVENOUS | Status: DC

## 2014-10-08 MED ORDER — SUCRALFATE 1 G PO TABS
1.0000 g | ORAL_TABLET | Freq: Once | ORAL | Status: AC
  Administered 2014-10-08: 1 g via ORAL
  Filled 2014-10-08: qty 1

## 2014-10-08 MED ORDER — ACETAMINOPHEN 500 MG PO TABS
1000.0000 mg | ORAL_TABLET | Freq: Once | ORAL | Status: AC
  Administered 2014-10-08: 1000 mg via ORAL
  Filled 2014-10-08: qty 2

## 2014-10-08 MED ORDER — PANTOPRAZOLE SODIUM 40 MG IV SOLR
40.0000 mg | Freq: Once | INTRAVENOUS | Status: AC
  Administered 2014-10-08: 40 mg via INTRAVENOUS
  Filled 2014-10-08: qty 40

## 2014-10-08 MED ORDER — MORPHINE SULFATE 4 MG/ML IJ SOLN
4.0000 mg | Freq: Once | INTRAMUSCULAR | Status: AC
Start: 2014-10-08 — End: 2014-10-08
  Administered 2014-10-08: 4 mg via INTRAVENOUS
  Filled 2014-10-08: qty 1

## 2014-10-08 MED ORDER — NITROGLYCERIN 2 % TD OINT
1.0000 [in_us] | TOPICAL_OINTMENT | Freq: Once | TRANSDERMAL | Status: AC
  Administered 2014-10-08: 1 [in_us] via TOPICAL
  Filled 2014-10-08: qty 1

## 2014-10-08 MED ORDER — ASPIRIN 325 MG PO TABS
325.0000 mg | ORAL_TABLET | Freq: Once | ORAL | Status: AC
  Administered 2014-10-08: 325 mg via ORAL
  Filled 2014-10-08: qty 1

## 2014-10-08 MED ORDER — ACETAMINOPHEN 325 MG PO TABS
650.0000 mg | ORAL_TABLET | ORAL | Status: DC | PRN
  Administered 2014-10-08 – 2014-10-09 (×2): 650 mg via ORAL
  Filled 2014-10-08: qty 2

## 2014-10-08 MED ORDER — ASPIRIN EC 81 MG PO TBEC
81.0000 mg | DELAYED_RELEASE_TABLET | Freq: Every day | ORAL | Status: DC
  Administered 2014-10-09: 81 mg via ORAL
  Filled 2014-10-08: qty 1

## 2014-10-08 MED ORDER — NITROGLYCERIN 0.4 MG SL SUBL: 0.4000 mg | SUBLINGUAL_TABLET | SUBLINGUAL | Status: DC | PRN

## 2014-10-08 MED ORDER — ASPIRIN 300 MG RE SUPP: 300.0000 mg | RECTAL | Status: AC

## 2014-10-08 MED ORDER — HEPARIN SODIUM (PORCINE) 5000 UNIT/ML IJ SOLN
5000.0000 [IU] | Freq: Three times a day (TID) | INTRAMUSCULAR | Status: DC
  Administered 2014-10-08 – 2014-10-09 (×3): 5000 [IU] via SUBCUTANEOUS
  Filled 2014-10-08 (×3): qty 1

## 2014-10-08 MED ORDER — PANTOPRAZOLE SODIUM 40 MG PO TBEC
40.0000 mg | DELAYED_RELEASE_TABLET | Freq: Every day | ORAL | Status: DC
  Administered 2014-10-08 – 2014-10-09 (×2): 40 mg via ORAL
  Filled 2014-10-08 (×2): qty 1

## 2014-10-08 MED ORDER — ONDANSETRON HCL 4 MG/2ML IJ SOLN
4.0000 mg | Freq: Four times a day (QID) | INTRAMUSCULAR | Status: DC | PRN
  Administered 2014-10-09 (×2): 4 mg via INTRAVENOUS
  Filled 2014-10-08 (×2): qty 2

## 2014-10-08 MED ORDER — ALUM & MAG HYDROXIDE-SIMETH 200-200-20 MG/5ML PO SUSP
30.0000 mL | ORAL | Status: DC | PRN
  Administered 2014-10-08 – 2014-10-09 (×2): 30 mL via ORAL
  Filled 2014-10-08 (×2): qty 30

## 2014-10-08 MED ORDER — ONDANSETRON HCL 4 MG/2ML IJ SOLN
4.0000 mg | Freq: Once | INTRAMUSCULAR | Status: AC
  Administered 2014-10-08: 4 mg via INTRAVENOUS
  Filled 2014-10-08: qty 2

## 2014-10-08 MED ORDER — ASPIRIN 81 MG PO CHEW: 324.0000 mg | CHEWABLE_TABLET | ORAL | Status: AC

## 2014-10-08 MED ORDER — REGADENOSON 0.4 MG/5ML IV SOLN
0.4000 mg | Freq: Once | INTRAVENOUS | Status: AC
  Administered 2014-10-09: 0.4 mg via INTRAVENOUS
  Filled 2014-10-08: qty 5

## 2014-10-08 NOTE — ED Notes (Signed)
Spoke to dr zacowski.  Pt may have [place] sandwich.

## 2014-10-08 NOTE — H&P (Signed)
Chief Complaint: Chest Pain Primary Cardiologist: New PCP: Dr. Brigitte Pulse  Patient profile: a 78 year old female with no prior cardiac history who presents to the [facility] emergency department with complaints of chest pain.   HPI: The patient is a 78 year old female with a history of GERD and migraine headaches, followed by Dr. Brigitte Pulse, who presents to the [facility] ED with complaints of chest pain. She denies any prior history of CAD/MI. She was in her usual state of health until earlier this morning. She works as a Psychologist, occupational at [employer]. This morning, while ambulating around the hospital, she developed left-sided chest discomfort radiating to her left scapula and left neck. The discomfort was described as a burning sensation. She noted associated mild dyspnea. No diaphoresis, nausea, vomiting, syncope/near-syncope. She has never felt anything like this before. Her discomfort persisted, prompting her to present to the ED. On arrival, a nitroglycerin patch was applied alleviating her symptoms. She is currently chest pain-free. In the ED, initial troponin is negative. EKG shows sinus tach with rate in the low 100s. BMP unremarkable. CBC with slightly elevated WBC at 11. CXR is unremarkable. BNP WNL. Lipase normal.   She does not think this is her typical GERD symptoms. She reports daily compliance with omeprazole and states that her symptoms have been well-controlled with PPI therapy. She also notes that occasionally in the past, she has experienced mild exertional chest tightness however her symptoms in the past have been short-lived and self-limiting.  Past Medical History  Diagnosis Date  . Lacunar infarction   . Hyperlipidemia   . History of colonic polyps   . Herniated disc   . Common migraine   . Acid reflux disease   . Diverticulosis   . Esophageal stricture   . Hiatal hernia 2009    EGD-Small     Past Surgical History  Procedure Laterality Date  . Total abdominal hysterectomy     . Inguinal hernia repair      rt.  . Cholecystectomy      4-5 YRS AGO.  Marland Kitchen Eye surgery      BILATERAL CATARAT  . Rectocele repair N/A 02/13/2013    Procedure: POSTERIOR REPAIR (RECTOCELE);  Surgeon: Emily Filbert, MD;  Location: [facility];  Service: Gynecology;  Laterality: N/A;    Family History  Problem Relation Age of Onset  . Cancer Brother     kidney cancer  . Aneurysm Sister 53    died   . Breast cancer Maternal Aunt   . Breast cancer Paternal Aunt   . Colon cancer Neg Hx    Social History:  reports that she has quit smoking. She has never used smokeless tobacco. She reports that she does not drink alcohol or use illicit drugs.  Allergies:  Allergies  Allergen Reactions  . Isometheptene-Apap-Dichloral Nausea Only    (midrin)  . Oxycodone Hcl Nausea And Vomiting    Pt reports tolerating vicodin    Prior to Admission medications   Medication Sig Start Date End Date Taking? Authorizing Provider  aspirin-acetaminophen-caffeine (EXCEDRIN MIGRAINE) [phone] MG per tablet Take 2 tablets by mouth daily as needed for headache or migraine.   Yes Historical Provider, MD  Biotin 1 MG CAPS Take 1 capsule by mouth every morning.    Yes Historical Provider, MD  Multiple Vitamins-Minerals (CENTRUM SILVER ADULT 50+) TABS Take 1 tablet by mouth every morning.   Yes Historical Provider, MD  Multiple Vitamins-Minerals (PRESERVISION AREDS PO) Take by mouth.   Yes Historical  Provider, MD  omeprazole (PRILOSEC) 20 MG capsule Take 20 mg by mouth every morning.    Yes Historical Provider, MD      Results for orders placed or performed during the hospital encounter of 10/08/14 (from the past 48 hour(s))  CBC     Status: Abnormal   Collection Time: 10/08/14 12:05 PM  Result Value Ref Range   WBC 11.0 (H) 4.0 - 10.5 K/uL   RBC 5.10 3.87 - 5.11 MIL/uL   Hemoglobin 14.2 12.0 - 15.0 g/dL   HCT 42.6 36.0 - 46.0 %   MCV 83.5 78.0 - 100.0 fL   MCH 27.8 26.0 - 34.0 pg   MCHC 33.3 30.0 - 36.0  g/dL   RDW 13.6 11.5 - 15.5 %   Platelets 263 150 - 400 K/uL  Basic metabolic panel     Status: Abnormal   Collection Time: 10/08/14 12:05 PM  Result Value Ref Range   Sodium 142 137 - 147 mEq/L   Potassium 3.8 3.7 - 5.3 mEq/L   Chloride 100 96 - 112 mEq/L   CO2 28 19 - 32 mEq/L   Glucose, Bld 91 70 - 99 mg/dL   BUN 15 6 - 23 mg/dL   Creatinine, Ser 0.66 0.50 - 1.10 mg/dL   Calcium 9.6 8.4 - 10.5 mg/dL   GFR calc non Af Amer 84 (L) >90 mL/min   GFR calc Af Amer >90 >90 mL/min    Comment: (NOTE) The eGFR has been calculated using the CKD EPI equation. This calculation has not been validated in all clinical situations. eGFR's persistently <90 mL/min signify possible Chronic Kidney Disease.    Anion gap 14 5 - 15  BNP (order ONLY if patient complains of dyspnea/SOB AND you have documented it for THIS visit)     Status: None   Collection Time: 10/08/14 12:05 PM  Result Value Ref Range   Pro B Natriuretic peptide (BNP) 148.2 0 - 450 pg/mL  I-stat troponin, ED (not at [facility])     Status: None   Collection Time: 10/08/14 12:13 PM  Result Value Ref Range   Troponin i, poc 0.01 0.00 - 0.08 ng/mL   Comment 3            Comment: Due to the release kinetics of cTnI, a negative result within the first hours of the onset of symptoms does not rule out myocardial infarction with certainty. If myocardial infarction is still suspected, repeat the test at appropriate intervals.   Lipase, blood     Status: None   Collection Time: 10/08/14 12:20 PM  Result Value Ref Range   Lipase 18 11 - 59 U/L  Hepatic function panel     Status: None   Collection Time: 10/08/14 12:20 PM  Result Value Ref Range   Total Protein 7.8 6.0 - 8.3 g/dL   Albumin 4.3 3.5 - 5.2 g/dL   AST 21 0 - 37 U/L   ALT 15 0 - 35 U/L   Alkaline Phosphatase 77 39 - 117 U/L   Total Bilirubin 0.3 0.3 - 1.2 mg/dL   Bilirubin, Direct <0.2 0.0 - 0.3 mg/dL   Indirect Bilirubin NOT CALCULATED 0.3 - 0.9 mg/dL   Dg Chest 2  View  10/08/2014   CLINICAL DATA:  Chest tightness.  Dyspnea.  EXAM: CHEST  2 VIEW  COMPARISON:  11/13/2009  FINDINGS: The heart size is normal. There is mild perihilar peribronchial thickening. No focal consolidations or pleural effusions are identified. No pulmonary edema.  Surgical clips are noted in the right upper quadrant of the abdomen.  IMPRESSION: 1. Bronchitic changes. 2.  No focal acute pulmonary abnormality.   Electronically Signed   By: Shon Hale M.D.   On: 10/08/2014 13:09    Review of Systems  Respiratory: Positive for shortness of breath.   Cardiovascular: Positive for chest pain. Negative for orthopnea and leg swelling.  Gastrointestinal: Negative for nausea and vomiting.  Musculoskeletal: Positive for joint pain and neck pain.  Neurological: Negative for dizziness and loss of consciousness.  All other systems reviewed and are negative.   Blood pressure 119/63, pulse 93, temperature 98 F (36.7 C), temperature source Oral, resp. rate 20, height 5' 3.5" (1.613 m), weight 145 lb (65.772 kg), SpO2 95 %. Physical Exam  Constitutional: She is oriented to person, place, and time. She appears well-developed and well-nourished. No distress.  Neck: No JVD present. Carotid bruit is not present.  Cardiovascular: Normal rate and regular rhythm.  Exam reveals no gallop and no friction rub.   No murmur heard. Pulses:      Radial pulses are 2+ on the right side, and 2+ on the left side.       Dorsalis pedis pulses are 2+ on the right side, and 2+ on the left side.  Respiratory: Effort normal and breath sounds normal. No respiratory distress. She has no wheezes. She has no rales.  GI: Soft. Bowel sounds are normal. She exhibits no distension and no mass. There is no tenderness.  Musculoskeletal: She exhibits no edema.  Neurological: She is alert and oriented to person, place, and time.  Skin: Skin is warm and dry. She is not diaphoretic.  Psychiatric: She has a normal mood and affect.  Her behavior is normal.     Assessment/Plan Principal Problem:   Chest pain with moderate risk of acute coronary syndrome  1. Chest pain: patient's symptoms are concerning for unstable angina. Her pain is currently controlled with nitroglycerin patch. Initial troponin is negative. EKG is nonacute. Heart rate and blood pressure both stable. Given the nature of her symptoms and fact that she has not had any recent cardiac evaluations, would recommend admitting to observation for rule out. Continue to cycle cardiac enzymes 3. NPO at midnight. Plan either nuclear stress testing or left heart catheterization in AM based on cardiac enzymes.   SIMMONS, BRITTAINY 10/08/2014, 5:05 PM   Patient seen and examined. Agree with assessment and plan.Very pleasant 78 yo WF with h/o GERD, remote esophageal dilatation 4 yrs ago by Dr. 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 Her chest pain has impoved with topical nitrates. ECG is without acute changes. Agree with keeping overnight for observation, cycle cardiac enzymes, f/u ECG and probable risk stratify with nuclear study if above studies are negative.   Troy Sine, MD, [facility] 10/08/2014 5:47 PM

## 2014-10-08 NOTE — ED Notes (Signed)
Cardiologist at bedside.  

## 2014-10-08 NOTE — ED Notes (Signed)
Pt. Reports SOB and "burning" in chest since 0730 this AM with radiation to back and neck. Denies cardiac history. Does not appear in distress at this time. Alert and oriented x4.

## 2014-10-08 NOTE — ED Notes (Signed)
Attempted report to floor.  

## 2014-10-08 NOTE — ED Notes (Signed)
Patient stated does not have a allergy to tylenol.

## 2014-10-08 NOTE — ED Notes (Signed)
Attempted report 

## 2014-10-08 NOTE — ED Provider Notes (Signed)
CSN: [id number]     Arrival date & time 10/08/14  1157 History   First MD Initiated Contact with Patient 10/08/14 1324     Chief Complaint  Patient presents with  . Chest Pain     (Consider location/radiation/quality/duration/timing/severity/associated sxs/prior Treatment) HPI  The patient ports that she developed central chest burning today at a charity event. She reports it is an uncomfortable burning and fullness sensation in her central chest. Patient does feel short of breath in association with this. She denies any sweating or syncopal episode. She reports a little bit of a uncomfortable or heavy feeling in her left shoulder in association with it.the patient does report she takes Excedrin a couple of times a day for headaches. She denies she's had any vomiting and denies any history of bloody vomiting. She reports she has had an endoscopy and was told it was okay for her to keep taking Excedrin so long she took her Prilosec. No lower extremity symptoms of swelling or pain.  Past Medical History  Diagnosis Date  . Lacunar infarction   . Hyperlipidemia   . History of colonic polyps   . Herniated disc   . Common migraine   . Acid reflux disease   . Diverticulosis   . Esophageal stricture   . Hiatal hernia 2009    EGD-Small    Past Surgical History  Procedure Laterality Date  . Total abdominal hysterectomy    . Inguinal hernia repair      rt.  . Cholecystectomy      4-5 YRS AGO.  Marland Kitchen Eye surgery      BILATERAL CATARAT  . Rectocele repair N/A 02/13/2013    Procedure: POSTERIOR REPAIR (RECTOCELE);  Surgeon: Emily Filbert, MD;  Location: [facility];  Service: Gynecology;  Laterality: N/A;   Family History  Problem Relation Age of Onset  . Cancer Brother     kidney cancer  . Aneurysm Sister 73    died   . Breast cancer Maternal Aunt   . Breast cancer Paternal Aunt   . Colon cancer Neg Hx    History  Substance Use Topics  . Smoking status: Former Research scientist (life sciences)  . Smokeless tobacco:  Never Used  . Alcohol Use: No   OB History    Gravida Para Term Preterm AB TAB SAB Ectopic Multiple Living   3 3        3      Review of Systems  10 Systems reviewed and are negative for acute change except as noted in the HPI.   Allergies  Isometheptene-apap-dichloral and Oxycodone hcl  Home Medications   Prior to Admission medications   Medication Sig Start Date End Date Taking? Authorizing Provider  aspirin-acetaminophen-caffeine (EXCEDRIN MIGRAINE) [phone] MG per tablet Take 2 tablets by mouth daily as needed for headache or migraine.   Yes Historical Provider, MD  Biotin 1 MG CAPS Take 1 capsule by mouth every morning.    Yes Historical Provider, MD  Multiple Vitamins-Minerals (CENTRUM SILVER ADULT 50+) TABS Take 1 tablet by mouth every morning.   Yes Historical Provider, MD  Multiple Vitamins-Minerals (PRESERVISION AREDS PO) Take by mouth.   Yes Historical Provider, MD  omeprazole (PRILOSEC) 20 MG capsule Take 20 mg by mouth every morning.    Yes Historical Provider, MD   BP 119/63 mmHg  Pulse 93  Temp(Src) 98 F (36.7 C) (Oral)  Resp 20  Ht 5' 3.5" (1.613 m)  Wt 145 lb (65.772 kg)  BMI 25.28 kg/m2  SpO2 95% Physical Exam  Constitutional: She is oriented to person, place, and time. She appears well-developed and well-nourished.  HENT:  Head: Normocephalic and atraumatic.  Eyes: EOM are normal. Pupils are equal, round, and reactive to light.  Neck: Neck supple.  Cardiovascular: Normal rate, regular rhythm, normal heart sounds and intact distal pulses.   Pulmonary/Chest: Effort normal and breath sounds normal.  Abdominal: Soft. Bowel sounds are normal. She exhibits no distension. There is no tenderness.  Musculoskeletal: Normal range of motion. She exhibits no edema.  Neurological: She is alert and oriented to person, place, and time. She has normal strength. Coordination normal. GCS eye subscore is 4. GCS verbal subscore is 5. GCS motor subscore is 6.  Skin: Skin  is warm, dry and intact.  Psychiatric: She has a normal mood and affect.    ED Course  Procedures (including critical care time) Labs Review Labs Reviewed  CBC - Abnormal; Notable for the following:    WBC 11.0 (*)    All other components within normal limits  BASIC METABOLIC PANEL - Abnormal; Notable for the following:    GFR calc non Af Amer 84 (*)    All other components within normal limits  PRO B NATRIURETIC PEPTIDE  LIPASE, BLOOD  HEPATIC FUNCTION PANEL  I-STAT TROPOININ, ED    Imaging Review Dg Chest 2 View  10/08/2014   CLINICAL DATA:  Chest tightness.  Dyspnea.  EXAM: CHEST  2 VIEW  COMPARISON:  11/13/2009  FINDINGS: The heart size is normal. There is mild perihilar peribronchial thickening. No focal consolidations or pleural effusions are identified. No pulmonary edema. Surgical clips are noted in the right upper quadrant of the abdomen.  IMPRESSION: 1. Bronchitic changes. 2.  No focal acute pulmonary abnormality.   Electronically Signed   By: Shon Hale M.D.   On: 10/08/2014 13:09     EKG Interpretation   Date/Time:  Monday October 08 2014 12:04:04 EST Ventricular Rate:  106 PR Interval:  150 QRS Duration: 76 QT Interval:  328 QTC Calculation: 435 R Axis:   33 Text Interpretation:  Sinus tachycardia Cannot rule out Anterior infarct ,  age undetermined Abnormal ECG agree. no change from old. Confirmed by  Johnney Killian, MD, Jeannie Done [phone]) on 10/08/2014 1:25:38 PM     Consult: Cardiology was consult for evaluation of the patient in the emergency department. MDM   Final diagnoses:  Chest pain, unspecified chest pain type   The patient presents with burning quality of chest discomfort with associated shortness of breath. At this point there is concern for an anginal equivalent type of presentation. The patient has no prior cardiac workup and has a history of hypertension. At this time cardiac enzymes are negative and first EKG does not show no acute ischemic  pattern.    Charlesetta Shanks, MD 10/08/14 [phone]

## 2014-10-09 ENCOUNTER — Encounter (HOSPITAL_COMMUNITY): Payer: Medicare PPO

## 2014-10-09 ENCOUNTER — Observation Stay (HOSPITAL_COMMUNITY): Payer: Medicare PPO

## 2014-10-09 ENCOUNTER — Other Ambulatory Visit: Payer: Self-pay

## 2014-10-09 DIAGNOSIS — R079 Chest pain, unspecified: Secondary | ICD-10-CM

## 2014-10-09 LAB — BASIC METABOLIC PANEL
Anion gap: 14 (ref 5–15)
BUN: 11 mg/dL (ref 6–23)
CO2: 25 mEq/L (ref 19–32)
Calcium: 8.8 mg/dL (ref 8.4–10.5)
Chloride: 99 mEq/L (ref 96–112)
Creatinine, Ser: 0.61 mg/dL (ref 0.50–1.10)
GFR calc Af Amer: >90 mL/min (ref >90)
GFR calc non Af Amer: 86 mL/min — ABNORMAL LOW (ref >90)
Glucose, Bld: 111 mg/dL — ABNORMAL HIGH (ref 70–99)
Potassium: 3.8 mEq/L (ref 3.7–5.3)
Sodium: 138 mEq/L (ref 137–147)

## 2014-10-09 LAB — LIPID PANEL
Cholesterol: 156 mg/dL (ref 0–200)
HDL: 43 mg/dL (ref >39)
LDL Cholesterol: 81 mg/dL (ref 0–99)
Total CHOL/HDL Ratio: 3.6 RATIO
Triglycerides: 161 mg/dL — ABNORMAL HIGH (ref <150)
VLDL: 32 mg/dL (ref 0–40)

## 2014-10-09 LAB — CBC
HCT: 37.2 % (ref 36.0–46.0)
Hemoglobin: 12.2 g/dL (ref 12.0–15.0)
MCH: 27.4 pg (ref 26.0–34.0)
MCHC: 32.8 g/dL (ref 30.0–36.0)
MCV: 83.6 fL (ref 78.0–100.0)
Platelets: 208 10*3/uL (ref 150–400)
RBC: 4.45 MIL/uL (ref 3.87–5.11)
RDW: 13.7 % (ref 11.5–15.5)
WBC: 7.3 10*3/uL (ref 4.0–10.5)

## 2014-10-09 LAB — TROPONIN I
Troponin I: <0.3 ng/mL (ref <0.30)
Troponin I: <0.3 ng/mL (ref <0.30)

## 2014-10-09 MED ORDER — ONDANSETRON HCL 4 MG/2ML IJ SOLN
INTRAMUSCULAR | Status: AC
  Filled 2014-10-09: qty 2

## 2014-10-09 MED ORDER — REGADENOSON 0.4 MG/5ML IV SOLN: 0.4000 mg | Freq: Once | INTRAVENOUS | Status: DC

## 2014-10-09 MED ORDER — TECHNETIUM TC 99M SESTAMIBI GENERIC - CARDIOLITE
10.0000 | Freq: Once | INTRAVENOUS | Status: AC | PRN
  Administered 2014-10-09: 10 via INTRAVENOUS

## 2014-10-09 MED ORDER — TECHNETIUM TC 99M SESTAMIBI GENERIC - CARDIOLITE
30.0000 | Freq: Once | INTRAVENOUS | Status: AC | PRN
Start: 2014-10-09 — End: 2014-10-09
  Administered 2014-10-09: 30 via INTRAVENOUS

## 2014-10-09 MED ORDER — PROMETHAZINE HCL 25 MG/ML IJ SOLN: 12.5000 mg | Freq: Three times a day (TID) | INTRAMUSCULAR | Status: DC | PRN

## 2014-10-09 MED ORDER — ACETAMINOPHEN 325 MG PO TABS
ORAL_TABLET | ORAL | Status: AC
  Filled 2014-10-09: qty 2

## 2014-10-09 MED ORDER — ALUM & MAG HYDROXIDE-SIMETH 200-200-20 MG/5ML PO SUSP: 30.0000 mL | ORAL | Status: DC | PRN

## 2014-10-09 MED ORDER — SODIUM CHLORIDE 0.9 % IV SOLN
250.0000 mL | Freq: Once | INTRAVENOUS | Status: AC
  Administered 2014-10-09: 250 mL via INTRAVENOUS

## 2014-10-09 MED ORDER — PROMETHAZINE HCL 25 MG/ML IJ SOLN
INTRAMUSCULAR | Status: AC
  Administered 2014-10-09: 12.5 mg
  Filled 2014-10-09: qty 1

## 2014-10-09 MED ORDER — ACETAMINOPHEN 325 MG PO TABS: 650.0000 mg | ORAL_TABLET | ORAL | Status: DC | PRN

## 2014-10-09 MED ORDER — REGADENOSON 0.4 MG/5ML IV SOLN
INTRAVENOUS | Status: AC
  Administered 2014-10-09: 0.4 mg via INTRAVENOUS
  Filled 2014-10-09: qty 5

## 2014-10-09 MED ORDER — MORPHINE SULFATE 4 MG/ML IJ SOLN
3.0000 mg | INTRAMUSCULAR | Status: DC | PRN
  Administered 2014-10-09 (×3): 3 mg via INTRAVENOUS
  Filled 2014-10-09 (×3): qty 1

## 2014-10-09 NOTE — Progress Notes (Signed)
Patient ID: [name] [name], female   DOB: 06/14/1936, 78 y.o.   MRN: [id number]    Subjective:  Denies SSCP, palpitations or Dyspnea Headache about gone Going for nuclear test   Objective:  Filed Vitals:   10/08/14 2000 10/08/14 2015 10/08/14 2059 10/09/14 0534  BP: 113/62 114/61 114/64 134/73  Pulse: 88 87 87 82  Temp:   98.4 F (36.9 C) 98.1 F (36.7 C)  TempSrc:   Oral Oral  Resp: 21 16 18 18   Height:   5\' 3"  (1.6 m)   Weight:   66.679 kg (147 lb) 66.6 kg (146 lb 13.2 oz)  SpO2: 93% 92% 95% 95%    Intake/Output from previous day:  Intake/Output Summary (Last 24 hours) at 10/09/14 0824 Last data filed at 10/09/14 0001  Gross per 24 hour  Intake      0 ml  Output      0 ml  Net      0 ml    Physical Exam: Affect appropriate Healthy:  appears stated age HEENT: normal Neck supple with no adenopathy JVP normal no bruits no thyromegaly Lungs clear with no wheezing and good diaphragmatic motion Heart:  S1/S2 no murmur, no rub, gallop or click PMI normal Abdomen: benighn, BS positve, no tenderness, no AAA no bruit.  No HSM or HJR Distal pulses intact with no bruits No edema Neuro non-focal Skin warm and dry No muscular weakness   Lab Results: Basic Metabolic Panel:  Recent Labs  10/08/14 1205 10/09/14 0318  NA 142 138  K 3.8 3.8  CL 100 99  CO2 28 25  GLUCOSE 91 111*  BUN 15 11  CREATININE 0.66 0.61  CALCIUM 9.6 8.8   Liver Function Tests:  Recent Labs  10/08/14 1220  AST 21  ALT 15  ALKPHOS 77  BILITOT 0.3  PROT 7.8  ALBUMIN 4.3    Recent Labs  10/08/14 1220  LIPASE 18   CBC:  Recent Labs  10/08/14 1205 10/09/14 0318  WBC 11.0* 7.3  HGB 14.2 12.2  HCT 42.6 37.2  MCV 83.5 83.6  PLT 263 208   Cardiac Enzymes:  Recent Labs  10/08/14 2120 10/09/14 0318  TROPONINI <0.30 <0.30   Fasting Lipid Panel:  Recent Labs  10/09/14 0318  CHOL 156  HDL 43  LDLCALC 81  TRIG 161*  CHOLHDL 3.6    Imaging: Dg Chest 2  View  10/08/2014   CLINICAL DATA:  Chest tightness.  Dyspnea.  EXAM: CHEST  2 VIEW  COMPARISON:  11/13/2009  FINDINGS: The heart size is normal. There is mild perihilar peribronchial thickening. No focal consolidations or pleural effusions are identified. No pulmonary edema. Surgical clips are noted in the right upper quadrant of the abdomen.  IMPRESSION: 1. Bronchitic changes. 2.  No focal acute pulmonary abnormality.   Electronically Signed   By: Shon Hale M.D.   On: 10/08/2014 13:09    Cardiac Studies:  ECG:  SR no acute ST/T wave changes    Telemetry:  NSR no arrhythmia  Echo:   Medications:   . aspirin EC  81 mg Oral Daily  . heparin  5,000 Units Subcutaneous 3 times per day  . pantoprazole  40 mg Oral Daily  . regadenoson  0.4 mg Intravenous Once       Assessment/Plan:  Chest Pain:  Non acute no ECG changes enzymes negative  See note by TK  Lexiscan myovue this am Headache:  Improved tylenol may get worse with  vasodilator during stress test  History of Migraines   Jenkins Rouge 10/09/2014, 8:24 AM

## 2014-10-09 NOTE — Discharge Summary (Signed)
Patient ID: [name] [name],  MRN: [id number], DOB/AGE: Feb 28, 1936 78 y.o.  Admit date: 10/08/2014 Discharge date: 10/09/2014  Primary Care Provider: Marton Redwood, MD Primary Cardiologist: Dr Claiborne Billings (new)  Discharge Diagnoses Principal Problem:   Chest pain with moderate risk of acute coronary syndrome Active Problems:   Migraine without aura   Esophageal reflux    Procedures: Leane Call 10/09/14   Hospital Course:  78 year old female with a history of GERD and migraine headaches, followed by Dr. Brigitte Pulse, who presents to the [facility] ED with complaints of chest pain. She denies any prior history of CAD/MI. Her symptoms were worrisome enough to warrant overnight admission and in pt Lexiscan. This was done 10/09/14 and was negative. Dr Johnsie Cancel feels she can be discharged. We will arrange f/u in the office once with Brittainy Simmins, Dr Evette Georges PA.  Discharge Vitals:  Blood pressure 124/50, pulse 83, temperature 98.5 F (36.9 C), temperature source Oral, resp. rate 18, height 5\' 3"  (1.6 m), weight 146 lb 13.2 oz (66.6 kg), SpO2 95 %.    Labs: Results for orders placed or performed during the hospital encounter of 10/08/14 (from the past 24 hour(s))  Troponin I (q 6hr x 3)     Status: None   Collection Time: 10/08/14  9:20 PM  Result Value Ref Range   Troponin I <0.30 <0.30 ng/mL  Basic metabolic panel     Status: Abnormal   Collection Time: 10/09/14  3:18 AM  Result Value Ref Range   Sodium 138 137 - 147 mEq/L   Potassium 3.8 3.7 - 5.3 mEq/L   Chloride 99 96 - 112 mEq/L   CO2 25 19 - 32 mEq/L   Glucose, Bld 111 (H) 70 - 99 mg/dL   BUN 11 6 - 23 mg/dL   Creatinine, Ser 0.61 0.50 - 1.10 mg/dL   Calcium 8.8 8.4 - 10.5 mg/dL   GFR calc non Af Amer 86 (L) >90 mL/min   GFR calc Af Amer >90 >90 mL/min   Anion gap 14 5 - 15  CBC     Status: None   Collection Time: 10/09/14  3:18 AM  Result Value Ref Range   WBC 7.3 4.0 - 10.5 K/uL   RBC 4.45 3.87 - 5.11 MIL/uL   Hemoglobin 12.2 12.0 - 15.0 g/dL   HCT 37.2 36.0 - 46.0 %   MCV 83.6 78.0 - 100.0 fL   MCH 27.4 26.0 - 34.0 pg   MCHC 32.8 30.0 - 36.0 g/dL   RDW 13.7 11.5 - 15.5 %   Platelets 208 150 - 400 K/uL  Lipid panel     Status: Abnormal   Collection Time: 10/09/14  3:18 AM  Result Value Ref Range   Cholesterol 156 0 - 200 mg/dL   Triglycerides 161 (H) <150 mg/dL   HDL 43 >39 mg/dL   Total CHOL/HDL Ratio 3.6 RATIO   VLDL 32 0 - 40 mg/dL   LDL Cholesterol 81 0 - 99 mg/dL  Troponin I (q 6hr x 3)     Status: None   Collection Time: 10/09/14  3:18 AM  Result Value Ref Range   Troponin I <0.30 <0.30 ng/mL  Troponin I (q 6hr x 3)     Status: None   Collection Time: 10/09/14 12:20 PM  Result Value Ref Range   Troponin I <0.30 <0.30 ng/mL    Disposition:  Follow-up Information    Follow up with Troy Sine, MD.   Specialty:  Cardiology  Why:  office will call   Contact information:   [address] [place] [address] [phone]       Discharge Medications:    Medication List    TAKE these medications        acetaminophen 325 MG tablet  Commonly known as:  TYLENOL  Take 2 tablets (650 mg total) by mouth every 4 (four) hours as needed for headache or mild pain.     alum & mag hydroxide-simeth 200-200-20 MG/5ML suspension  Commonly known as:  MAALOX/MYLANTA  Take 30 mLs by mouth as needed for indigestion or heartburn.     aspirin-acetaminophen-caffeine 594-585-92 MG per tablet  Commonly known as:  EXCEDRIN MIGRAINE  Take 2 tablets by mouth daily as needed for headache or migraine.     Biotin 1 MG Caps  Take 1 capsule by mouth every morning.     CENTRUM SILVER ADULT 50+ Tabs  Take 1 tablet by mouth every morning.     PRESERVISION AREDS PO  Take by mouth.     omeprazole 20 MG capsule  Commonly known as:  PRILOSEC  Take 20 mg by mouth every morning.         Duration of Discharge Encounter: Greater than 30 minutes including physician  time.  Angelena Form PA-C 10/09/2014 4:05 PM

## 2014-10-09 NOTE — Plan of Care (Signed)
Problem: Consults Goal: Chest Pain Patient Education (See Patient Education module for education specifics.) Outcome: Progressing  Problem: Phase I Progression Outcomes Goal: Voiding-avoid urinary catheter unless indicated Outcome: Progressing

## 2014-10-09 NOTE — Discharge Instructions (Signed)

## 2014-10-09 NOTE — Progress Notes (Signed)
UR completed 

## 2014-10-09 NOTE — Progress Notes (Signed)
Lexiscan myoview completed without complications, + N&V resolved in recovery.  Headache stable.  After test, with a little Coke and crackers with tylenol + N&V.Marland Kitchen Will add phenergan IV 12.5 mg, may help with N&V and headache.  + runs of PACS.   Nuc results to follow.

## 2014-10-10 ENCOUNTER — Telehealth: Payer: Self-pay | Admitting: Cardiovascular Disease

## 2014-10-10 NOTE — Telephone Encounter (Signed)
Closed encounter °

## 2014-11-05 ENCOUNTER — Ambulatory Visit: Payer: Medicare PPO | Admitting: Cardiology

## 2014-11-08 ENCOUNTER — Ambulatory Visit: Payer: Medicare PPO | Admitting: Cardiovascular Disease

## 2014-11-30 DIAGNOSIS — H3530 Unspecified macular degeneration: Secondary | ICD-10-CM

## 2014-11-30 HISTORY — DX: Unspecified macular degeneration: H35.30

## 2014-12-04 DIAGNOSIS — Z1212 Encounter for screening for malignant neoplasm of rectum: Secondary | ICD-10-CM | POA: Diagnosis not present

## 2014-12-12 ENCOUNTER — Other Ambulatory Visit (HOSPITAL_COMMUNITY): Payer: Self-pay | Admitting: Internal Medicine

## 2014-12-12 DIAGNOSIS — Z1231 Encounter for screening mammogram for malignant neoplasm of breast: Secondary | ICD-10-CM

## 2015-01-03 ENCOUNTER — Ambulatory Visit (HOSPITAL_COMMUNITY)
Admission: RE | Admit: 2015-01-03 | Discharge: 2015-01-03 | Disposition: A | Discharge status: Home / Self Care | Payer: Commercial Managed Care - HMO | Source: Ambulatory Visit | Attending: Internal Medicine | Admitting: Internal Medicine

## 2015-01-03 DIAGNOSIS — Z1231 Encounter for screening mammogram for malignant neoplasm of breast: Secondary | ICD-10-CM | POA: Insufficient documentation

## 2015-01-08 DIAGNOSIS — R51 Headache: Secondary | ICD-10-CM | POA: Diagnosis not present

## 2015-01-08 DIAGNOSIS — Z79899 Other long term (current) drug therapy: Secondary | ICD-10-CM | POA: Diagnosis not present

## 2015-01-08 DIAGNOSIS — Z049 Encounter for examination and observation for unspecified reason: Secondary | ICD-10-CM | POA: Diagnosis not present

## 2015-01-08 DIAGNOSIS — G43719 Chronic migraine without aura, intractable, without status migrainosus: Secondary | ICD-10-CM | POA: Diagnosis not present

## 2015-01-18 DIAGNOSIS — G43719 Chronic migraine without aura, intractable, without status migrainosus: Secondary | ICD-10-CM | POA: Diagnosis not present

## 2015-01-18 DIAGNOSIS — G518 Other disorders of facial nerve: Secondary | ICD-10-CM | POA: Diagnosis not present

## 2015-01-18 DIAGNOSIS — M542 Cervicalgia: Secondary | ICD-10-CM | POA: Diagnosis not present

## 2015-01-18 DIAGNOSIS — R51 Headache: Secondary | ICD-10-CM | POA: Diagnosis not present

## 2015-01-18 DIAGNOSIS — M791 Myalgia: Secondary | ICD-10-CM | POA: Diagnosis not present

## 2015-01-22 DIAGNOSIS — H35363 Drusen (degenerative) of macula, bilateral: Secondary | ICD-10-CM | POA: Diagnosis not present

## 2015-01-22 DIAGNOSIS — H3531 Nonexudative age-related macular degeneration: Secondary | ICD-10-CM | POA: Diagnosis not present

## 2015-02-01 DIAGNOSIS — G518 Other disorders of facial nerve: Secondary | ICD-10-CM | POA: Diagnosis not present

## 2015-02-01 DIAGNOSIS — M542 Cervicalgia: Secondary | ICD-10-CM | POA: Diagnosis not present

## 2015-02-01 DIAGNOSIS — M791 Myalgia: Secondary | ICD-10-CM | POA: Diagnosis not present

## 2015-02-01 DIAGNOSIS — R51 Headache: Secondary | ICD-10-CM | POA: Diagnosis not present

## 2015-02-01 DIAGNOSIS — G43719 Chronic migraine without aura, intractable, without status migrainosus: Secondary | ICD-10-CM | POA: Diagnosis not present

## 2015-02-19 DIAGNOSIS — G4733 Obstructive sleep apnea (adult) (pediatric): Secondary | ICD-10-CM | POA: Diagnosis not present

## 2015-02-21 DIAGNOSIS — G43719 Chronic migraine without aura, intractable, without status migrainosus: Secondary | ICD-10-CM | POA: Diagnosis not present

## 2015-02-21 DIAGNOSIS — M542 Cervicalgia: Secondary | ICD-10-CM | POA: Diagnosis not present

## 2015-02-21 DIAGNOSIS — G518 Other disorders of facial nerve: Secondary | ICD-10-CM | POA: Diagnosis not present

## 2015-02-21 DIAGNOSIS — R51 Headache: Secondary | ICD-10-CM | POA: Diagnosis not present

## 2015-02-21 DIAGNOSIS — M791 Myalgia: Secondary | ICD-10-CM | POA: Diagnosis not present

## 2015-03-07 DIAGNOSIS — M791 Myalgia: Secondary | ICD-10-CM | POA: Diagnosis not present

## 2015-03-07 DIAGNOSIS — R51 Headache: Secondary | ICD-10-CM | POA: Diagnosis not present

## 2015-03-07 DIAGNOSIS — M542 Cervicalgia: Secondary | ICD-10-CM | POA: Diagnosis not present

## 2015-03-07 DIAGNOSIS — G518 Other disorders of facial nerve: Secondary | ICD-10-CM | POA: Diagnosis not present

## 2015-03-07 DIAGNOSIS — G43719 Chronic migraine without aura, intractable, without status migrainosus: Secondary | ICD-10-CM | POA: Diagnosis not present

## 2015-03-28 DIAGNOSIS — G43719 Chronic migraine without aura, intractable, without status migrainosus: Secondary | ICD-10-CM | POA: Diagnosis not present

## 2015-03-28 DIAGNOSIS — M791 Myalgia: Secondary | ICD-10-CM | POA: Diagnosis not present

## 2015-03-28 DIAGNOSIS — R51 Headache: Secondary | ICD-10-CM | POA: Diagnosis not present

## 2015-03-28 DIAGNOSIS — G518 Other disorders of facial nerve: Secondary | ICD-10-CM | POA: Diagnosis not present

## 2015-03-28 DIAGNOSIS — M542 Cervicalgia: Secondary | ICD-10-CM | POA: Diagnosis not present

## 2015-04-02 DIAGNOSIS — L859 Epidermal thickening, unspecified: Secondary | ICD-10-CM | POA: Diagnosis not present

## 2015-04-02 DIAGNOSIS — D485 Neoplasm of uncertain behavior of skin: Secondary | ICD-10-CM | POA: Diagnosis not present

## 2015-04-02 DIAGNOSIS — L281 Prurigo nodularis: Secondary | ICD-10-CM | POA: Diagnosis not present

## 2015-04-02 DIAGNOSIS — L821 Other seborrheic keratosis: Secondary | ICD-10-CM | POA: Diagnosis not present

## 2015-04-11 DIAGNOSIS — M542 Cervicalgia: Secondary | ICD-10-CM | POA: Diagnosis not present

## 2015-04-11 DIAGNOSIS — G43719 Chronic migraine without aura, intractable, without status migrainosus: Secondary | ICD-10-CM | POA: Diagnosis not present

## 2015-04-11 DIAGNOSIS — R51 Headache: Secondary | ICD-10-CM | POA: Diagnosis not present

## 2015-04-11 DIAGNOSIS — G518 Other disorders of facial nerve: Secondary | ICD-10-CM | POA: Diagnosis not present

## 2015-04-11 DIAGNOSIS — M791 Myalgia: Secondary | ICD-10-CM | POA: Diagnosis not present

## 2015-05-23 ENCOUNTER — Other Ambulatory Visit: Payer: Self-pay | Admitting: Internal Medicine

## 2015-07-15 DIAGNOSIS — M542 Cervicalgia: Secondary | ICD-10-CM | POA: Diagnosis not present

## 2015-07-15 DIAGNOSIS — R51 Headache: Secondary | ICD-10-CM | POA: Diagnosis not present

## 2015-07-15 DIAGNOSIS — G43719 Chronic migraine without aura, intractable, without status migrainosus: Secondary | ICD-10-CM | POA: Diagnosis not present

## 2015-07-15 DIAGNOSIS — M791 Myalgia: Secondary | ICD-10-CM | POA: Diagnosis not present

## 2015-07-23 DIAGNOSIS — H35363 Drusen (degenerative) of macula, bilateral: Secondary | ICD-10-CM | POA: Diagnosis not present

## 2015-07-23 DIAGNOSIS — H3531 Nonexudative age-related macular degeneration: Secondary | ICD-10-CM | POA: Diagnosis not present

## 2015-09-03 DIAGNOSIS — D485 Neoplasm of uncertain behavior of skin: Secondary | ICD-10-CM | POA: Diagnosis not present

## 2015-09-03 DIAGNOSIS — L720 Epidermal cyst: Secondary | ICD-10-CM | POA: Diagnosis not present

## 2015-09-03 DIAGNOSIS — D3612 Benign neoplasm of peripheral nerves and autonomic nervous system, upper limb, including shoulder: Secondary | ICD-10-CM | POA: Diagnosis not present

## 2015-09-03 DIAGNOSIS — L853 Xerosis cutis: Secondary | ICD-10-CM | POA: Diagnosis not present

## 2015-09-03 DIAGNOSIS — L814 Other melanin hyperpigmentation: Secondary | ICD-10-CM | POA: Diagnosis not present

## 2015-09-03 DIAGNOSIS — L281 Prurigo nodularis: Secondary | ICD-10-CM | POA: Diagnosis not present

## 2015-09-07 DIAGNOSIS — Z23 Encounter for immunization: Secondary | ICD-10-CM | POA: Diagnosis not present

## 2015-09-23 ENCOUNTER — Other Ambulatory Visit: Payer: Self-pay | Admitting: Internal Medicine

## 2015-09-24 ENCOUNTER — Telehealth: Payer: Self-pay | Admitting: Internal Medicine

## 2015-09-26 ENCOUNTER — Other Ambulatory Visit: Payer: Self-pay | Admitting: Internal Medicine

## 2015-09-26 MED ORDER — OMEPRAZOLE 20 MG PO CPDR: 20.0000 mg | DELAYED_RELEASE_CAPSULE | Freq: Every day | ORAL | Status: DC

## 2015-09-26 NOTE — Telephone Encounter (Signed)
Refilled Omeprazole 

## 2015-10-02 ENCOUNTER — Telehealth: Payer: Self-pay

## 2015-10-02 NOTE — Telephone Encounter (Signed)
Spoke with pharmacist at [name] to clarify the Omeprazole rx per their request - patient takes 1 tablet TWICE a day.  They acknowledged and understood

## 2015-10-31 DIAGNOSIS — G43719 Chronic migraine without aura, intractable, without status migrainosus: Secondary | ICD-10-CM | POA: Diagnosis not present

## 2015-12-09 ENCOUNTER — Ambulatory Visit: Payer: Medicare PPO | Admitting: Internal Medicine

## 2015-12-11 ENCOUNTER — Ambulatory Visit (INDEPENDENT_AMBULATORY_CARE_PROVIDER_SITE_OTHER): Payer: Commercial Managed Care - HMO | Admitting: Internal Medicine

## 2015-12-11 ENCOUNTER — Encounter: Payer: Self-pay | Admitting: Internal Medicine

## 2015-12-11 VITALS — BP 140/76 | HR 96 | Ht 64.0 in | Wt 141.5 lb

## 2015-12-11 DIAGNOSIS — Z789 Other specified health status: Secondary | ICD-10-CM | POA: Diagnosis not present

## 2015-12-11 DIAGNOSIS — Z0000 Encounter for general adult medical examination without abnormal findings: Secondary | ICD-10-CM | POA: Diagnosis not present

## 2015-12-11 DIAGNOSIS — M791 Myalgia: Secondary | ICD-10-CM | POA: Diagnosis not present

## 2015-12-11 DIAGNOSIS — K219 Gastro-esophageal reflux disease without esophagitis: Secondary | ICD-10-CM

## 2015-12-11 DIAGNOSIS — E781 Pure hyperglyceridemia: Secondary | ICD-10-CM | POA: Diagnosis not present

## 2015-12-11 DIAGNOSIS — R8290 Unspecified abnormal findings in urine: Secondary | ICD-10-CM | POA: Diagnosis not present

## 2015-12-11 DIAGNOSIS — R1310 Dysphagia, unspecified: Secondary | ICD-10-CM | POA: Diagnosis not present

## 2015-12-11 DIAGNOSIS — M7918 Myalgia, other site: Secondary | ICD-10-CM

## 2015-12-11 DIAGNOSIS — K222 Esophageal obstruction: Secondary | ICD-10-CM

## 2015-12-11 DIAGNOSIS — N390 Urinary tract infection, site not specified: Secondary | ICD-10-CM | POA: Diagnosis not present

## 2015-12-11 MED ORDER — OMEPRAZOLE 20 MG PO CPDR: 20.0000 mg | DELAYED_RELEASE_CAPSULE | Freq: Two times a day (BID) | ORAL | Status: DC

## 2015-12-11 NOTE — Patient Instructions (Signed)
We have sent the following medications to your pharmacy for you to pick up at your convenience:  Omeprazole  You have been scheduled for an endoscopy. Please follow written instructions given to you at your visit today. If you use inhalers (even only as needed), please bring them with you on the day of your procedure.  

## 2015-12-11 NOTE — Progress Notes (Signed)
HISTORY OF PRESENT ILLNESS:  [name] [name] is a 80 y.o. female with hyperlipidemia, migraine headaches, macular degeneration and prior lacunar infarct. She is followed in this office for GERD complicated by peptic stricture and adenomatous colon polyps. She is status post cholecystectomy. She was last evaluated in the office a 16th 2012. She presents today with multiple chief complaints. (#1) ongoing management of GERD and requests refill of omeprazole, (#2) recurrent intermittent solid food dysphagia item such as chicken and rice. This is gotten worse over the past year, (#3) right upper quadrant cramping discomfort which she has had off and on for years. The discomfort seems to occur exclusively when sitting and resolves with standing and stretching. Typically last no more than 3-4 minutes. No other associated symptoms, and (#4) and epigastric "knot" which is tender. Wishes to have this evaluated. This has been noticed in recent months. No associated features  REVIEW OF SYSTEMS:  All non-GI ROS negative except for arthritis, back pain, visual change, fatigue, headaches, itching  Past Medical History  Diagnosis Date  . Lacunar infarction ([place])   . Hyperlipidemia   . History of colonic polyps   . Herniated disc   . Common migraine   . Acid reflux disease   . Diverticulosis   . Esophageal stricture   . Hiatal hernia 2009    EGD-Small   . Macular degeneration     Past Surgical History  Procedure Laterality Date  . Total abdominal hysterectomy    . Inguinal hernia repair      rt.  . Cholecystectomy      4-5 YRS AGO.  Marland Kitchen Eye surgery      BILATERAL CATARAT  . Rectocele repair N/A 02/13/2013    Procedure: POSTERIOR REPAIR (RECTOCELE);  Surgeon: Emily Filbert, MD;  Location: [facility];  Service: Gynecology;  Laterality: N/A;    Social History VARSHINI KIEU  reports that she has quit smoking. She has never used smokeless tobacco. She reports that she does not drink alcohol or use illicit  drugs.  family history includes Aneurysm (age of onset: 33) in her sister; Breast cancer in her maternal aunt and paternal aunt; Cancer in her brother. There is no history of Colon cancer.  Allergies  Allergen Reactions  . Isometheptene-Dichloral-Apap Nausea Only    (midrin)  . Oxycodone Hcl Nausea And Vomiting    Pt reports tolerating vicodin       PHYSICAL EXAMINATION: Vital signs: BP 140/76 mmHg  Pulse 96  Ht 5\' 4"  (1.626 m)  Wt 141 lb 8 oz (64.184 kg)  BMI 24.28 kg/m2  Constitutional: generally well-appearing, no acute distress Psychiatric: alert and oriented x3, cooperative Eyes: extraocular movements intact, anicteric, conjunctiva pink Mouth: oral pharynx moist, no lesions Neck: supple without thyromegaly Lymph: no lymphadenopathy Chest: Tenderness of the xiphoid process with palpation. This is the "knot" that she describes  Cardiovascular: heart regular rate and rhythm, no murmur Lungs: clear to auscultation bilaterally Abdomen: soft, nontender, nondistended, no obvious ascites, no peritoneal signs, normal bowel sounds, no organomegaly Rectal: Omitted Extremities: no lower extremity edema bilaterally Skin: no clubbing cyanosis or lesions on visible extremities Neuro: No focal deficits. Cranial nerves intact.    ASSESSMENT:  #1. Chronic GERD requiring PPI for symptom control #2. Recurrent intermittent solid food dysphagia. Known peptic stricture which was dilated with 54 Isabell Jarvis dilator April 2009  #3. Complaints of right upper quadrant cramping discomfort most consistent with musculoskeletal etiology. Reassured #4. Knot in the epigastric region benign xiphoid  process of the sternum . Reassured #5. History of adenomatous colon polyps. Agent out of surveillance   PLAN:  #1. Reflux precautions #2. Refill omeprazole point milligrams twice daily. #3. Schedule upper endoscopy with esophageal dilation.The nature of the procedure, as well as the risks,  benefits, and alternatives were carefully and thoroughly reviewed with the patient. Ample time for discussion and questions allowed. The patient understood, was satisfied, and agreed to proceed. #4. Routine GI office follow-up in 1 year

## 2015-12-12 ENCOUNTER — Ambulatory Visit (AMBULATORY_SURGERY_CENTER): Payer: Commercial Managed Care - HMO | Admitting: Internal Medicine

## 2015-12-12 ENCOUNTER — Encounter: Payer: Self-pay | Admitting: Internal Medicine

## 2015-12-12 VITALS — BP 147/78 | HR 77 | Temp 97.6°F | Resp 20 | Ht 64.0 in | Wt 141.0 lb

## 2015-12-12 DIAGNOSIS — K222 Esophageal obstruction: Secondary | ICD-10-CM | POA: Diagnosis not present

## 2015-12-12 DIAGNOSIS — G4733 Obstructive sleep apnea (adult) (pediatric): Secondary | ICD-10-CM | POA: Diagnosis not present

## 2015-12-12 DIAGNOSIS — R1310 Dysphagia, unspecified: Secondary | ICD-10-CM | POA: Diagnosis not present

## 2015-12-12 DIAGNOSIS — K219 Gastro-esophageal reflux disease without esophagitis: Secondary | ICD-10-CM

## 2015-12-12 MED ORDER — SODIUM CHLORIDE 0.9 % IV SOLN
500.0000 mL | INTRAVENOUS | Status: DC
Start: 2015-12-12 — End: 2015-12-12

## 2015-12-12 NOTE — Progress Notes (Signed)
Called to room to assist during endoscopic procedure.  Patient ID and intended procedure confirmed with present staff. Received instructions for my participation in the procedure from the performing physician.  

## 2015-12-12 NOTE — Op Note (Signed)
[facility]  [address] [place], [id number]   ENDOSCOPY PROCEDURE REPORT  PATIENT: [name], [name]  MR#: [id number] BIRTHDATE: 1936/02/23 , 79  yrs. old GENDER: female ENDOSCOPIST: Eustace Quail, MD REFERRED BY:  .  Self / Office PROCEDURE DATE:  12/12/2015 PROCEDURE:  EGD, diagnostic and Maloney dilation of esophagus ASA CLASS:     Class II INDICATIONS:  dysphagia. MEDICATIONS: Monitored anesthesia care and Propofol 100 mg IV TOPICAL ANESTHETIC: none  DESCRIPTION OF PROCEDURE: After the risks benefits and alternatives of the procedure were thoroughly explained, informed consent was obtained.  The LB [id number] endoscope was introduced through the mouth and advanced to the second portion of the duodenum , Without limitations.  The instrument was slowly withdrawn as the mucosa was fully examined.   EXAM:Esophagus revealed a benign ringlike stricture at the gastroesophageal junction measuring approximately 15 mm.  Stomach was normal.  The duodenum was normal.  Retroflexed views revealed a hiatal hernia.     The scope was then withdrawn from the patient and the procedure completed.THERAPY: 54 French Maloney dilator was passed into the esophagus without resistance or heme. Tolerated well  COMPLICATIONS: There were no immediate complications.  ENDOSCOPIC IMPRESSION: 1. Benign esophageal stricture status post dilation-54 [place] 2. GERD  RECOMMENDATIONS: 1.  Continue current medications 2.  Clear liquids until  12 noon, then soft foods rest of day. Resume prior diet tomorrow. 3. If you have any further difficulties, please contact Dr. Henrene Pastor. Otherwise return to the care of Dr. Brigitte Pulse  REPEAT EXAM:  eSigned:  Eustace Quail, MD 12/12/2015 10:41 AM    CC:The Patient and Janalyn Rouse, MD

## 2015-12-12 NOTE — Patient Instructions (Signed)
YOU HAD AN ENDOSCOPIC PROCEDURE TODAY AT THE [facility]:   Refer to the procedure report that was given to you for any specific questions about what was found during the examination.  If the procedure report does not answer your questions, please call your gastroenterologist to clarify.  If you requested that your care partner not be given the details of your procedure findings, then the procedure report has been included in a sealed envelope for you to review at your convenience later.  YOU SHOULD EXPECT: Some feelings of bloating in the abdomen. Passage of more gas than usual.  Walking can help get rid of the air that was put into your GI tract during the procedure and reduce the bloating. If you had a lower endoscopy (such as a colonoscopy or flexible sigmoidoscopy) you may notice spotting of blood in your stool or on the toilet paper. If you underwent a bowel prep for your procedure, you may not have a normal bowel movement for a few days.  Please Note:  You might notice some irritation and congestion in your nose or some drainage.  This is from the oxygen used during your procedure.  There is no need for concern and it should clear up in a day or so.  SYMPTOMS TO REPORT IMMEDIATELY:     Following upper endoscopy (EGD)  Vomiting of blood or coffee ground material  New chest pain or pain under the shoulder blades  Painful or persistently difficult swallowing  New shortness of breath  Fever of 100F or higher  Black, tarry-looking stools  For urgent or emergent issues, a gastroenterologist can be reached at any hour by calling [phone].   DIET:  Follow Dilation Handout.    ACTIVITY:  You should plan to take it easy for the rest of today and you should NOT DRIVE or use heavy machinery until tomorrow (because of the sedation medicines used during the test).    FOLLOW UP: Our staff will call the number listed on your records the next business day following your  procedure to check on you and address any questions or concerns that you may have regarding the information given to you following your procedure. If we do not reach you, we will leave a message.  However, if you are feeling well and you are not experiencing any problems, there is no need to return our call.  We will assume that you have returned to your regular daily activities without incident.  If any biopsies were taken you will be contacted by phone or by letter within the next 1-3 weeks.  Please call us at [phone] if you have not heard about the biopsies in 3 weeks.    SIGNATURES/CONFIDENTIALITY: You and/or your care partner have signed paperwork which will be entered into your electronic medical record.  These signatures attest to the fact that that the information above on your After Visit Summary has been reviewed and is understood.  Full responsibility of the confidentiality of this discharge information lies with you and/or your care-partner.   Resume medications. Information given on dilation diet. 

## 2015-12-12 NOTE — Progress Notes (Signed)
Patient awakening,vss,report to rn 

## 2015-12-13 ENCOUNTER — Telehealth: Payer: Self-pay | Admitting: Emergency Medicine

## 2015-12-13 NOTE — Telephone Encounter (Signed)
  Follow up Call-  Call back number 12/12/2015 03/07/2014  Post procedure Call Back phone  # [phone] cell [phone]  Permission to leave phone message Yes Yes     Patient questions:  Do you have a fever, pain , or abdominal swelling? No. Pain Score  0 *  Have you tolerated food without any problems? Yes.    Have you been able to return to your normal activities? Yes.    Do you have any questions about your discharge instructions: Diet   No. Medications  No. Follow up visit  No.  Do you have questions or concerns about your Care? No.  Actions: * If pain score is 4 or above: No action needed, pain <4.

## 2015-12-17 DIAGNOSIS — K219 Gastro-esophageal reflux disease without esophagitis: Secondary | ICD-10-CM | POA: Diagnosis not present

## 2015-12-17 DIAGNOSIS — E781 Pure hyperglyceridemia: Secondary | ICD-10-CM | POA: Diagnosis not present

## 2015-12-17 DIAGNOSIS — R51 Headache: Secondary | ICD-10-CM | POA: Diagnosis not present

## 2015-12-17 DIAGNOSIS — Z1389 Encounter for screening for other disorder: Secondary | ICD-10-CM | POA: Diagnosis not present

## 2015-12-17 DIAGNOSIS — Z7189 Other specified counseling: Secondary | ICD-10-CM | POA: Diagnosis not present

## 2015-12-17 DIAGNOSIS — Z0000 Encounter for general adult medical examination without abnormal findings: Secondary | ICD-10-CM | POA: Diagnosis not present

## 2015-12-17 DIAGNOSIS — M25519 Pain in unspecified shoulder: Secondary | ICD-10-CM | POA: Diagnosis not present

## 2015-12-17 DIAGNOSIS — Z780 Asymptomatic menopausal state: Secondary | ICD-10-CM | POA: Diagnosis not present

## 2015-12-17 DIAGNOSIS — G4733 Obstructive sleep apnea (adult) (pediatric): Secondary | ICD-10-CM | POA: Diagnosis not present

## 2015-12-17 DIAGNOSIS — R030 Elevated blood-pressure reading, without diagnosis of hypertension: Secondary | ICD-10-CM | POA: Diagnosis not present

## 2015-12-27 ENCOUNTER — Other Ambulatory Visit: Payer: Self-pay

## 2015-12-27 DIAGNOSIS — Z1231 Encounter for screening mammogram for malignant neoplasm of breast: Secondary | ICD-10-CM

## 2015-12-30 DIAGNOSIS — N8110 Cystocele, unspecified: Secondary | ICD-10-CM | POA: Diagnosis not present

## 2015-12-30 DIAGNOSIS — N815 Vaginal enterocele: Secondary | ICD-10-CM | POA: Diagnosis not present

## 2016-01-03 DIAGNOSIS — N8110 Cystocele, unspecified: Secondary | ICD-10-CM | POA: Diagnosis not present

## 2016-01-17 ENCOUNTER — Ambulatory Visit
Admission: RE | Admit: 2016-01-17 | Discharge: 2016-01-17 | Disposition: A | Discharge status: Home / Self Care | Payer: Commercial Managed Care - HMO | Source: Ambulatory Visit

## 2016-01-17 DIAGNOSIS — Z1231 Encounter for screening mammogram for malignant neoplasm of breast: Secondary | ICD-10-CM

## 2016-01-17 DIAGNOSIS — Z1212 Encounter for screening for malignant neoplasm of rectum: Secondary | ICD-10-CM | POA: Diagnosis not present

## 2016-01-21 DIAGNOSIS — H43813 Vitreous degeneration, bilateral: Secondary | ICD-10-CM | POA: Diagnosis not present

## 2016-01-21 DIAGNOSIS — H35363 Drusen (degenerative) of macula, bilateral: Secondary | ICD-10-CM | POA: Diagnosis not present

## 2016-01-21 DIAGNOSIS — Z961 Presence of intraocular lens: Secondary | ICD-10-CM | POA: Diagnosis not present

## 2016-01-21 DIAGNOSIS — H353134 Nonexudative age-related macular degeneration, bilateral, advanced atrophic with subfoveal involvement: Secondary | ICD-10-CM | POA: Diagnosis not present

## 2016-01-27 ENCOUNTER — Other Ambulatory Visit: Payer: Self-pay | Admitting: Obstetrics & Gynecology

## 2016-01-28 NOTE — Patient Instructions (Signed)
Your procedure is scheduled on:  Thursday, February 13, 2016  Enter through the Main Entrance of [facility] at:  6:00 AM  Pick up the phone at the desk and dial [phone].  Call this number if you have problems the morning of surgery: [phone].  Remember:  Do NOT eat food or drink after:  Midnight Wednesday, February 12, 2016  Take these medicines the morning of surgery with a SIP OF WATER:  Prilosec  Do NOT wear jewelry (body piercing), metal hair clips/bobby pins, make-up, or nail polish. Do NOT wear lotions, powders, or perfumes.  You may wear deoderant. Do NOT shave for 48 hours prior to surgery. Do NOT bring valuables to the hospital. Contacts, dentures, or bridgework may not be worn into surgery.  Leave suitcase in car.  After surgery it may be brought to your room.  For patients admitted to the hospital, checkout time is 11:00 AM the day of discharge.  Have a responsible adult drive you home and stay with you for 24 hours after your procedure

## 2016-01-29 ENCOUNTER — Inpatient Hospital Stay (HOSPITAL_COMMUNITY)
Admission: RE | Admit: 2016-01-29 | Discharge: 2016-01-29 | Disposition: A | Discharge status: Home / Self Care | Payer: Commercial Managed Care - HMO | Source: Ambulatory Visit

## 2016-02-04 DIAGNOSIS — N815 Vaginal enterocele: Secondary | ICD-10-CM | POA: Diagnosis not present

## 2016-02-04 DIAGNOSIS — N8111 Cystocele, midline: Secondary | ICD-10-CM | POA: Diagnosis not present

## 2016-02-05 ENCOUNTER — Encounter (HOSPITAL_COMMUNITY): Payer: Self-pay

## 2016-02-05 ENCOUNTER — Encounter (HOSPITAL_COMMUNITY)
Admission: RE | Admit: 2016-02-05 | Discharge: 2016-02-05 | Disposition: A | Discharge status: Home / Self Care | Payer: Commercial Managed Care - HMO | Source: Ambulatory Visit | Attending: Obstetrics & Gynecology | Admitting: Obstetrics & Gynecology

## 2016-02-05 DIAGNOSIS — Z01812 Encounter for preprocedural laboratory examination: Secondary | ICD-10-CM | POA: Insufficient documentation

## 2016-02-05 HISTORY — DX: Unspecified osteoarthritis, unspecified site: M19.90

## 2016-02-05 LAB — COMPREHENSIVE METABOLIC PANEL
ALT: 14 U/L (ref 14–54)
AST: 19 U/L (ref 15–41)
Albumin: 4.5 g/dL (ref 3.5–5.0)
Alkaline Phosphatase: 67 U/L (ref 38–126)
Anion gap: 8 (ref 5–15)
BUN: 11 mg/dL (ref 6–20)
CO2: 28 mmol/L (ref 22–32)
Calcium: 9.2 mg/dL (ref 8.9–10.3)
Chloride: 102 mmol/L (ref 101–111)
Creatinine, Ser: 0.64 mg/dL (ref 0.44–1.00)
GFR calc Af Amer: >60 mL/min (ref >60)
GFR calc non Af Amer: >60 mL/min (ref >60)
Glucose, Bld: 87 mg/dL (ref 65–99)
Potassium: 4.4 mmol/L (ref 3.5–5.1)
Sodium: 138 mmol/L (ref 135–145)
Total Bilirubin: 0.6 mg/dL (ref 0.3–1.2)
Total Protein: 7.9 g/dL (ref 6.5–8.1)

## 2016-02-05 LAB — CBC
HCT: 42.4 % (ref 36.0–46.0)
Hemoglobin: 14.4 g/dL (ref 12.0–15.0)
MCH: 28.6 pg (ref 26.0–34.0)
MCHC: 34 g/dL (ref 30.0–36.0)
MCV: 84.3 fL (ref 78.0–100.0)
Platelets: 250 10*3/uL (ref 150–400)
RBC: 5.03 MIL/uL (ref 3.87–5.11)
RDW: 13.5 % (ref 11.5–15.5)
WBC: 7.5 10*3/uL (ref 4.0–10.5)

## 2016-02-05 LAB — TYPE AND SCREEN
ABO/RH(D): B POS
Antibody Screen: NEGATIVE

## 2016-02-05 LAB — ABO/RH: ABO/RH(D): B POS

## 2016-02-05 NOTE — Patient Instructions (Addendum)
Your procedure is scheduled on:  Thursday, February 13, 2016  Enter through the Main Entrance of [facility] at:  6:00 AM  Pick up the phone at the desk and dial [phone].  Call this number if you have problems the morning of surgery: [phone].  Remember:  Do NOT eat food or drink after:  Midnight Wednesday  Take these medicines the morning of surgery with a SIP OF WATER:  Omeprazole  Do NOT wear jewelry (body piercing), metal hair clips/bobby pins, make-up, or nail polish. Do NOT wear lotions, powders, or perfumes.  You may wear deoderant. Do NOT shave for 48 hours prior to surgery. Do NOT bring valuables to the hospital. Contacts, dentures, or bridgework may not be worn into surgery.  Leave suitcase in car.  After surgery it may be brought to your room.  For patients admitted to the hospital, checkout time is 11:00 AM the day of discharge.

## 2016-02-12 MED ORDER — CEFAZOLIN SODIUM-DEXTROSE 2-3 GM-% IV SOLR
2.0000 g | INTRAVENOUS | Status: AC
  Administered 2016-02-13: 2 g via INTRAVENOUS

## 2016-02-12 NOTE — Anesthesia Preprocedure Evaluation (Addendum)
Anesthesia Evaluation  Patient identified by MRN, date of birth, ID band Patient awake    Reviewed: Allergy & Precautions, NPO status , Patient's Chart, lab work & pertinent test results  History of Anesthesia Complications Negative for: history of anesthetic complications  Airway Mallampati: II  TM Distance: >3 FB Neck ROM: Full    Dental  (+) Teeth Intact, Dental Advisory Given   Pulmonary sleep apnea , former smoker,    Pulmonary exam normal        Cardiovascular + Peripheral Vascular Disease  Normal cardiovascular exam     Neuro/Psych  Headaches, CVA    GI/Hepatic Neg liver ROS, hiatal hernia, GERD  ,  Endo/Other  negative endocrine ROS  Renal/GU negative Renal ROS     Musculoskeletal   Abdominal   Peds  Hematology negative hematology ROS (+)   Anesthesia Other Findings   Reproductive/Obstetrics                            Anesthesia Physical Anesthesia Plan  ASA: III  Anesthesia Plan: General   Post-op Pain Management:    Induction: Intravenous and Rapid sequence  Airway Management Planned: Oral ETT  Additional Equipment:   Intra-op Plan:   Post-operative Plan: Extubation in OR  Informed Consent: I have reviewed the patients History and Physical, chart, labs and discussed the procedure including the risks, benefits and alternatives for the proposed anesthesia with the patient or authorized representative who has indicated his/her understanding and acceptance.   Dental advisory given  Plan Discussed with: Anesthesiologist  Anesthesia Plan Comments:        Anesthesia Quick Evaluation

## 2016-02-13 ENCOUNTER — Ambulatory Visit (HOSPITAL_COMMUNITY): Payer: Commercial Managed Care - HMO | Admitting: Anesthesiology

## 2016-02-13 ENCOUNTER — Ambulatory Visit (HOSPITAL_COMMUNITY)
Admission: RE | Admit: 2016-02-13 | Discharge: 2016-02-14 | Disposition: A | Discharge status: Home / Self Care | Payer: Commercial Managed Care - HMO | Source: Ambulatory Visit | Attending: Obstetrics & Gynecology | Admitting: Obstetrics & Gynecology

## 2016-02-13 ENCOUNTER — Encounter (HOSPITAL_COMMUNITY)
Admission: RE | Disposition: A | Discharge status: Home / Self Care | Payer: Self-pay | Source: Ambulatory Visit | Attending: Obstetrics & Gynecology

## 2016-02-13 ENCOUNTER — Encounter (HOSPITAL_COMMUNITY): Payer: Self-pay

## 2016-02-13 DIAGNOSIS — Z8673 Personal history of transient ischemic attack (TIA), and cerebral infarction without residual deficits: Secondary | ICD-10-CM | POA: Insufficient documentation

## 2016-02-13 DIAGNOSIS — M1990 Unspecified osteoarthritis, unspecified site: Secondary | ICD-10-CM | POA: Diagnosis not present

## 2016-02-13 DIAGNOSIS — N8111 Cystocele, midline: Secondary | ICD-10-CM | POA: Diagnosis not present

## 2016-02-13 DIAGNOSIS — Z9889 Other specified postprocedural states: Secondary | ICD-10-CM

## 2016-02-13 DIAGNOSIS — K219 Gastro-esophageal reflux disease without esophagitis: Secondary | ICD-10-CM | POA: Insufficient documentation

## 2016-02-13 DIAGNOSIS — Z86010 Personal history of colonic polyps: Secondary | ICD-10-CM | POA: Insufficient documentation

## 2016-02-13 DIAGNOSIS — D649 Anemia, unspecified: Secondary | ICD-10-CM | POA: Diagnosis not present

## 2016-02-13 DIAGNOSIS — G4730 Sleep apnea, unspecified: Secondary | ICD-10-CM | POA: Insufficient documentation

## 2016-02-13 DIAGNOSIS — K4090 Unilateral inguinal hernia, without obstruction or gangrene, not specified as recurrent: Secondary | ICD-10-CM | POA: Diagnosis not present

## 2016-02-13 DIAGNOSIS — N815 Vaginal enterocele: Secondary | ICD-10-CM | POA: Diagnosis not present

## 2016-02-13 DIAGNOSIS — N993 Prolapse of vaginal vault after hysterectomy: Secondary | ICD-10-CM | POA: Insufficient documentation

## 2016-02-13 DIAGNOSIS — I739 Peripheral vascular disease, unspecified: Secondary | ICD-10-CM | POA: Insufficient documentation

## 2016-02-13 DIAGNOSIS — Z87891 Personal history of nicotine dependence: Secondary | ICD-10-CM | POA: Diagnosis not present

## 2016-02-13 HISTORY — PX: ROBOTIC ASSISTED LAPAROSCOPIC SACROCOLPOPEXY: SHX5388

## 2016-02-13 HISTORY — PX: ROBOTIC ASSISTED LAPAROSCOPIC LYSIS OF ADHESION: SHX6080

## 2016-02-13 SURGERY — ROBOTIC ASSISTED LAPAROSCOPIC SACROCOLPOPEXY
Anesthesia: General | Site: Abdomen

## 2016-02-13 MED ORDER — MIDAZOLAM HCL 5 MG/5ML IJ SOLN
INTRAMUSCULAR | Status: DC | PRN
  Administered 2016-02-13: 1 mg via INTRAVENOUS

## 2016-02-13 MED ORDER — LACTATED RINGERS IV SOLN
INTRAVENOUS | Status: DC
  Administered 2016-02-14: 08:00:00 via INTRAVENOUS

## 2016-02-13 MED ORDER — HYDROMORPHONE HCL 1 MG/ML IJ SOLN
0.5000 mg | INTRAMUSCULAR | Status: DC | PRN
  Administered 2016-02-13: 0.5 mg via INTRAVENOUS
  Filled 2016-02-13: qty 1

## 2016-02-13 MED ORDER — PHENYLEPHRINE HCL 10 MG/ML IJ SOLN
20.0000 mg | INTRAVENOUS | Status: DC | PRN
  Administered 2016-02-13: 10 ug/min via INTRAVENOUS

## 2016-02-13 MED ORDER — HYDROMORPHONE HCL 1 MG/ML IJ SOLN
0.2500 mg | INTRAMUSCULAR | Status: DC | PRN
  Administered 2016-02-13 (×2): 0.25 mg via INTRAVENOUS
  Administered 2016-02-13: 0.5 mg via INTRAVENOUS

## 2016-02-13 MED ORDER — FENTANYL CITRATE (PF) 250 MCG/5ML IJ SOLN
INTRAMUSCULAR | Status: AC
  Filled 2016-02-13: qty 5

## 2016-02-13 MED ORDER — SODIUM CHLORIDE 0.9 % IJ SOLN
INTRAMUSCULAR | Status: AC
  Filled 2016-02-13: qty 50

## 2016-02-13 MED ORDER — ACETAMINOPHEN 325 MG PO TABS
ORAL_TABLET | ORAL | Status: AC
  Filled 2016-02-13: qty 2

## 2016-02-13 MED ORDER — PHENYLEPHRINE HCL 10 MG/ML IJ SOLN
INTRAMUSCULAR | Status: DC | PRN
  Administered 2016-02-13 (×4): 40 ug via INTRAVENOUS

## 2016-02-13 MED ORDER — PHENYLEPHRINE 8 MG IN D5W 100 ML (0.08MG/ML) PREMIX OPTIME
INJECTION | INTRAVENOUS | Status: AC
  Filled 2016-02-13: qty 100

## 2016-02-13 MED ORDER — ONDANSETRON HCL 4 MG/2ML IJ SOLN
INTRAMUSCULAR | Status: DC | PRN
  Administered 2016-02-13: 4 mg via INTRAVENOUS

## 2016-02-13 MED ORDER — PROMETHAZINE HCL 25 MG/ML IJ SOLN: 6.2500 mg | INTRAMUSCULAR | Status: DC | PRN

## 2016-02-13 MED ORDER — PROPOFOL 10 MG/ML IV BOLUS
INTRAVENOUS | Status: AC
  Filled 2016-02-13: qty 20

## 2016-02-13 MED ORDER — LIDOCAINE HCL (CARDIAC) 20 MG/ML IV SOLN
INTRAVENOUS | Status: DC | PRN
  Administered 2016-02-13: 40 mg via INTRAVENOUS

## 2016-02-13 MED ORDER — MIDAZOLAM HCL 2 MG/2ML IJ SOLN
INTRAMUSCULAR | Status: AC
  Filled 2016-02-13: qty 2

## 2016-02-13 MED ORDER — ROPIVACAINE HCL 5 MG/ML IJ SOLN
INTRAMUSCULAR | Status: AC
  Filled 2016-02-13: qty 30

## 2016-02-13 MED ORDER — LACTATED RINGERS IV SOLN
INTRAVENOUS | Status: DC
  Administered 2016-02-13 (×3): via INTRAVENOUS

## 2016-02-13 MED ORDER — LIDOCAINE HCL (CARDIAC) 20 MG/ML IV SOLN: INTRAVENOUS | Status: DC | PRN

## 2016-02-13 MED ORDER — GLYCOPYRROLATE 0.2 MG/ML IJ SOLN
INTRAMUSCULAR | Status: AC
  Filled 2016-02-13: qty 4

## 2016-02-13 MED ORDER — PROPOFOL 10 MG/ML IV BOLUS
INTRAVENOUS | Status: DC | PRN
  Administered 2016-02-13: 20 mg via INTRAVENOUS
  Administered 2016-02-13: 100 mg via INTRAVENOUS

## 2016-02-13 MED ORDER — PHENYLEPHRINE 40 MCG/ML (10ML) SYRINGE FOR IV PUSH (FOR BLOOD PRESSURE SUPPORT)
PREFILLED_SYRINGE | INTRAVENOUS | Status: AC
  Filled 2016-02-13: qty 10

## 2016-02-13 MED ORDER — ROCURONIUM BROMIDE 100 MG/10ML IV SOLN
INTRAVENOUS | Status: DC | PRN
  Administered 2016-02-13: 10 mg via INTRAVENOUS
  Administered 2016-02-13: 40 mg via INTRAVENOUS
  Administered 2016-02-13 (×4): 10 mg via INTRAVENOUS

## 2016-02-13 MED ORDER — BUPIVACAINE HCL (PF) 0.25 % IJ SOLN
INTRAMUSCULAR | Status: AC
  Filled 2016-02-13: qty 30

## 2016-02-13 MED ORDER — SODIUM CHLORIDE 0.9 % IJ SOLN
INTRAMUSCULAR | Status: DC | PRN
  Administered 2016-02-13: 30 mL via INTRAVENOUS

## 2016-02-13 MED ORDER — NEOSTIGMINE METHYLSULFATE 10 MG/10ML IV SOLN
INTRAVENOUS | Status: DC | PRN
  Administered 2016-02-13: 3 mg via INTRAVENOUS

## 2016-02-13 MED ORDER — PROPOFOL 10 MG/ML IV BOLUS: INTRAVENOUS | Status: DC | PRN

## 2016-02-13 MED ORDER — BUPIVACAINE HCL (PF) 0.25 % IJ SOLN
INTRAMUSCULAR | Status: DC | PRN
  Administered 2016-02-13: 17 mL

## 2016-02-13 MED ORDER — TRAMADOL HCL 50 MG PO TABS: 50.0000 mg | ORAL_TABLET | Freq: Four times a day (QID) | ORAL | Status: DC | PRN

## 2016-02-13 MED ORDER — CEFAZOLIN SODIUM-DEXTROSE 2-3 GM-% IV SOLR
INTRAVENOUS | Status: AC
Start: 2016-02-13 — End: 2016-02-13
  Filled 2016-02-13: qty 50

## 2016-02-13 MED ORDER — DEXAMETHASONE SODIUM PHOSPHATE 10 MG/ML IJ SOLN
INTRAMUSCULAR | Status: AC
  Filled 2016-02-13: qty 1

## 2016-02-13 MED ORDER — GLYCOPYRROLATE 0.2 MG/ML IJ SOLN
INTRAMUSCULAR | Status: DC | PRN
  Administered 2016-02-13: 0.6 mg via INTRAVENOUS

## 2016-02-13 MED ORDER — ONDANSETRON HCL 4 MG/2ML IJ SOLN
INTRAMUSCULAR | Status: AC
  Filled 2016-02-13: qty 2

## 2016-02-13 MED ORDER — FENTANYL CITRATE (PF) 100 MCG/2ML IJ SOLN
INTRAMUSCULAR | Status: DC | PRN
  Administered 2016-02-13 (×5): 50 ug via INTRAVENOUS

## 2016-02-13 MED ORDER — ROPIVACAINE HCL 5 MG/ML IJ SOLN
INTRAMUSCULAR | Status: DC | PRN
  Administered 2016-02-13: 12:00:00

## 2016-02-13 MED ORDER — GABAPENTIN 300 MG PO CAPS
300.0000 mg | ORAL_CAPSULE | Freq: Every day | ORAL | Status: DC
  Administered 2016-02-13: 300 mg via ORAL
  Filled 2016-02-13 (×2): qty 1

## 2016-02-13 MED ORDER — HYDROMORPHONE HCL 1 MG/ML IJ SOLN
INTRAMUSCULAR | Status: AC
  Administered 2016-02-13: 0.25 mg via INTRAVENOUS
  Filled 2016-02-13: qty 1

## 2016-02-13 MED ORDER — METOCLOPRAMIDE HCL 5 MG/ML IJ SOLN
5.0000 mg | Freq: Once | INTRAMUSCULAR | Status: AC
  Administered 2016-02-13: 5 mg via INTRAVENOUS
  Filled 2016-02-13: qty 2

## 2016-02-13 MED ORDER — NEOSTIGMINE METHYLSULFATE 10 MG/10ML IV SOLN
INTRAVENOUS | Status: AC
  Filled 2016-02-13: qty 1

## 2016-02-13 MED ORDER — LIDOCAINE HCL (CARDIAC) 20 MG/ML IV SOLN
INTRAVENOUS | Status: AC
  Filled 2016-02-13: qty 5

## 2016-02-13 MED ORDER — ACETAMINOPHEN 325 MG PO TABS: 650.0000 mg | ORAL_TABLET | Freq: Once | ORAL | Status: DC

## 2016-02-13 MED ORDER — DEXAMETHASONE SODIUM PHOSPHATE 4 MG/ML IJ SOLN
INTRAMUSCULAR | Status: DC | PRN
  Administered 2016-02-13: 4 mg via INTRAVENOUS

## 2016-02-13 MED ORDER — ONDANSETRON HCL 4 MG/2ML IJ SOLN
4.0000 mg | Freq: Three times a day (TID) | INTRAMUSCULAR | Status: DC | PRN
  Administered 2016-02-13: 4 mg via INTRAVENOUS
  Filled 2016-02-13: qty 2

## 2016-02-13 MED ORDER — LACTATED RINGERS IR SOLN
Status: DC | PRN
  Administered 2016-02-13: 3000 mL

## 2016-02-13 MED ORDER — EPHEDRINE 5 MG/ML INJ
INTRAVENOUS | Status: AC
  Filled 2016-02-13: qty 10

## 2016-02-13 MED ORDER — ARTIFICIAL TEARS OP OINT
TOPICAL_OINTMENT | OPHTHALMIC | Status: DC | PRN
  Administered 2016-02-13: 1 via OPHTHALMIC

## 2016-02-13 MED ORDER — EPHEDRINE SULFATE 50 MG/ML IJ SOLN
INTRAMUSCULAR | Status: DC | PRN
  Administered 2016-02-13: 5 mg via INTRAVENOUS

## 2016-02-13 MED ORDER — IBUPROFEN 600 MG PO TABS
600.0000 mg | ORAL_TABLET | Freq: Four times a day (QID) | ORAL | Status: DC | PRN
  Administered 2016-02-14 (×2): 600 mg via ORAL
  Filled 2016-02-13 (×2): qty 1

## 2016-02-13 SURGICAL SUPPLY — 58 items
BAG URINE DRAINAGE (UROLOGICAL SUPPLIES) ×4 IMPLANT
BARRIER ADHS 3X4 INTERCEED (GAUZE/BANDAGES/DRESSINGS) ×4 IMPLANT
BLADE SURG 15 STRL LF C SS BP (BLADE) ×6 IMPLANT
BLADE SURG 15 STRL SS (BLADE) ×8
BRR ADH 4X3 ABS CNTRL BYND (GAUZE/BANDAGES/DRESSINGS) ×3
CABLE HIGH FREQUENCY MONO STRZ (ELECTRODE) ×4 IMPLANT
CANISTER SUCT 3000ML (MISCELLANEOUS) ×4 IMPLANT
CATH FOLEY 2WAY SLVR  5CC 16FR (CATHETERS) ×1
CATH FOLEY 2WAY SLVR  5CC 18FR (CATHETERS)
CATH FOLEY 2WAY SLVR 5CC 16FR (CATHETERS) ×3 IMPLANT
CATH FOLEY 2WAY SLVR 5CC 18FR (CATHETERS) ×2 IMPLANT
CHLORAPREP W/TINT 26ML (MISCELLANEOUS) ×4 IMPLANT
CLOTH BEACON ORANGE TIMEOUT ST (SAFETY) ×8 IMPLANT
COVER TIP SHEARS 8 DVNC (MISCELLANEOUS) ×3 IMPLANT
COVER TIP SHEARS 8MM DA VINCI (MISCELLANEOUS) ×1
DECANTER SPIKE VIAL GLASS SM (MISCELLANEOUS) ×4 IMPLANT
DEFOGGER SCOPE WARMER CLEARIFY (MISCELLANEOUS) ×4 IMPLANT
DRSG COVADERM PLUS 2X2 (GAUZE/BANDAGES/DRESSINGS) ×8 IMPLANT
DRSG OPSITE POSTOP 3X4 (GAUZE/BANDAGES/DRESSINGS) ×4 IMPLANT
GLOVE BIO SURGEON STRL SZ 6.5 (GLOVE) ×8 IMPLANT
GLOVE BIO SURGEON STRL SZ7 (GLOVE) ×4 IMPLANT
GLOVE BIOGEL PI IND STRL 7.0 (GLOVE) ×12 IMPLANT
GLOVE BIOGEL PI INDICATOR 7.0 (GLOVE) ×4
GOWN STRL REUS W/TWL LRG LVL3 (GOWN DISPOSABLE) ×16 IMPLANT
KIT ACCESSORY DA VINCI DISP (KITS) ×1
KIT ACCESSORY DVNC DISP (KITS) ×3 IMPLANT
LEGGING LITHOTOMY PAIR STRL (DRAPES) ×4 IMPLANT
LIQUID BAND (GAUZE/BANDAGES/DRESSINGS) ×8 IMPLANT
NS IRRIG 1000ML POUR BTL (IV SOLUTION) ×4 IMPLANT
OCCLUDER COLPOPNEUMO (BALLOONS) IMPLANT
PACK ROBOT WH (CUSTOM PROCEDURE TRAY) ×4 IMPLANT
PACK ROBOTIC GOWN (GOWN DISPOSABLE) ×4 IMPLANT
PACK VAGINAL WOMENS (CUSTOM PROCEDURE TRAY) ×4 IMPLANT
PAD PREP 24X48 CUFFED NSTRL (MISCELLANEOUS) ×4 IMPLANT
PAD TRENDELENBURG POSITION (MISCELLANEOUS) ×4 IMPLANT
SCISSORS LAP 5X35 DISP (ENDOMECHANICALS) ×2 IMPLANT
SET CYSTO W/LG BORE CLAMP LF (SET/KITS/TRAYS/PACK) ×4 IMPLANT
SET TRI-LUMEN FLTR TB AIRSEAL (TUBING) IMPLANT
SUT GORETEX NAB #0 THX26 36IN (SUTURE) ×14 IMPLANT
SUT VIC AB 0 CT1 27 (SUTURE)
SUT VIC AB 0 CT1 27XBRD ANTBC (SUTURE) IMPLANT
SUT VIC AB 2-0 SH 27 (SUTURE) ×12
SUT VIC AB 2-0 SH 27XBRD (SUTURE) ×9 IMPLANT
SUT VIC AB 4-0 PS2 27 (SUTURE) ×8 IMPLANT
SUT VICRYL 0 UR6 27IN ABS (SUTURE) ×4 IMPLANT
SUT VLOC 180 2-0 9IN GS21 (SUTURE) ×2 IMPLANT
SYSTEM CONVERTIBLE TROCAR (TROCAR) ×4 IMPLANT
TIP UTERINE 5.1X6CM LAV DISP (MISCELLANEOUS) IMPLANT
TIP UTERINE 6.7X10CM GRN DISP (MISCELLANEOUS) IMPLANT
TIP UTERINE 6.7X6CM WHT DISP (MISCELLANEOUS) IMPLANT
TIP UTERINE 6.7X8CM BLUE DISP (MISCELLANEOUS) IMPLANT
TOWEL OR 17X24 6PK STRL BLUE (TOWEL DISPOSABLE) ×12 IMPLANT
TROCAR 12M 150ML BLUNT (TROCAR) ×4 IMPLANT
TROCAR DISP BLADELESS 8 DVNC (TROCAR) ×3 IMPLANT
TROCAR DISP BLADELESS 8MM (TROCAR) ×1
TROCAR PORT AIRSEAL 5X120 (TROCAR) ×2 IMPLANT
TROCAR XCEL 12X100 BLDLESS (ENDOMECHANICALS) ×2 IMPLANT
WATER STERILE IRR 1000ML POUR (IV SOLUTION) ×8 IMPLANT

## 2016-02-13 NOTE — OR Nursing (Signed)
Dr. Lucia Gaskins in room at 10:20am

## 2016-02-13 NOTE — Anesthesia Postprocedure Evaluation (Signed)
Anesthesia Post Note  Patient: [name]  Procedure(s) Performed: Procedure(s) (LRB): ROBOTIC ASSISTED LAPAROSCOPIC SACROCOLPOPEXY With Alyte Y-Mesh (N/A) ROBOTIC ASSISTED LAPAROSCOPIC LYSIS OF ADHESION  Patient location during evaluation: PACU Anesthesia Type: General Level of consciousness: sedated Pain management: pain level controlled Vital Signs Assessment: post-procedure vital signs reviewed and stable Respiratory status: spontaneous breathing and respiratory function stable Cardiovascular status: stable Anesthetic complications: no    Last Vitals:  Filed Vitals:   02/13/16 1230 02/13/16 1245  BP: 139/79 147/72  Pulse: 91 90  Temp:    Resp: 17 14    Last Pain: There were no vitals filed for this visit.               Khadijah Mastrianni DANIEL

## 2016-02-13 NOTE — Transfer of Care (Signed)
Immediate Anesthesia Transfer of Care Note  Patient: [name] [name]  Procedure(s) Performed: Procedure(s): ROBOTIC ASSISTED LAPAROSCOPIC SACROCOLPOPEXY With Theola Sequin (N/A) ROBOTIC ASSISTED LAPAROSCOPIC LYSIS OF ADHESION  Patient Location: PACU  Anesthesia Type:General  Level of Consciousness: sedated  Airway & Oxygen Therapy: Patient Spontanous Breathing and Patient connected to nasal cannula oxygen  Post-op Assessment: Report given to RN and Post -op Vital signs reviewed and stable  Post vital signs: stable  Last Vitals:  Filed Vitals:   02/13/16 0609  BP: 130/94  Pulse: 92  Temp: 36.6 C  Resp: 20    Complications: No apparent anesthesia complications

## 2016-02-13 NOTE — H&P (Signed)
[name] [name] is an 80 y.o. female G3P3  RP:  Complete vaginal prolapse for Robotic Sacrocolpopexy and TVT  Pertinent Gynecological History:  Blood transfusions: none Sexually transmitted diseases: no past history Last mammogram: normal  Last pap: normal  OB History: G3P3   Menstrual History:  No LMP recorded. Patient has had a hysterectomy.    Past Medical History  Diagnosis Date  . Lacunar infarction ([place])   . Hyperlipidemia   . History of colonic polyps   . Herniated disc   . Common migraine   . Acid reflux disease   . Diverticulosis     patient denies  . Esophageal stricture   . Hiatal hernia 2009    EGD-Small   . Macular degeneration   . Blood transfusion without reported diagnosis   . Cataract     bilateral cateracts removed  . Sleep apnea     has c-pap but does not wear  . Arthritis     both hands  . Anemia     history of    Past Surgical History  Procedure Laterality Date  . Total abdominal hysterectomy    . Inguinal hernia repair      rt.  . Cholecystectomy      4-5 YRS AGO.  Marland Kitchen Eye surgery      BILATERAL CATARAT  . Rectocele repair N/A 02/13/2013    Procedure: POSTERIOR REPAIR (RECTOCELE);  Surgeon: Emily Filbert, MD;  Location: [facility];  Service: Gynecology;  Laterality: N/A;  . Colonoscopy    . Upper gastrointestinal endoscopy    . Esophageal dilation      Family History  Problem Relation Age of Onset  . Cancer Brother     kidney cancer  . Aneurysm Sister 11    died   . Breast cancer Maternal Aunt   . Colon cancer Maternal Aunt   . Breast cancer Paternal Aunt   . Esophageal cancer Neg Hx   . Rectal cancer Neg Hx   . Stomach cancer Neg Hx     Social History:  reports that she has quit smoking. Her smoking use included Cigarettes. She quit after 10 years of use. She has never used smokeless tobacco. She reports that she does not drink alcohol or use illicit drugs.  Allergies:  Allergies  Allergen Reactions  .  Isometheptene-Dichloral-Apap Nausea Only    (midrin)  . Oxycodone Hcl Nausea And Vomiting    Pt reports tolerating vicodin    Prescriptions prior to admission  Medication Sig Dispense Refill Last Dose  . Biotin 1 MG CAPS Take 1 capsule by mouth every morning. Reported on 12/12/2015   Not Taking  . gabapentin (NEURONTIN) 300 MG capsule Take 1 capsule by mouth at bedtime.   12/11/2015  . Multiple Vitamins-Minerals (CENTRUM SILVER ADULT 50+) TABS Take 1 tablet by mouth every morning. Reported on 12/12/2015   Not Taking  . Multiple Vitamins-Minerals (PRESERVISION AREDS PO) Take 1 tablet by mouth daily. Reported on 12/12/2015   Not Taking  . omeprazole (PRILOSEC) 20 MG capsule Take 1 capsule (20 mg total) by mouth 2 (two) times daily. (Patient taking differently: Take 20 mg by mouth 2 (two) times daily as needed (for heartburn). ) 60 capsule 11 12/12/2015  . triamcinolone cream (KENALOG) 0.1 % Apply 1 application topically daily as needed (for itching).    12/11/2015    ROS Neg  Blood pressure 130/94, pulse 92, temperature 97.9 F (36.6 C), temperature source Oral, resp. rate 20, SpO2 98 %.  Physical Exam   Vaginal wall/apex prolapsing at vulva.    Assessment/Plan: Complete Vaginal Prolapse for Robotic Sacrocolpopexy and TVT.  Surgery and risks reviewed thoroughly.  [name] [name],[name] [name] 02/13/2016, 6:45 AM

## 2016-02-13 NOTE — OR Nursing (Signed)
Dr. Lucia Gaskins out

## 2016-02-13 NOTE — OR Nursing (Signed)
Husband updated on  Patient condition and surgery progress at 0900 and 1115

## 2016-02-13 NOTE — Anesthesia Procedure Notes (Signed)
Procedure Name: Intubation Date/Time: 02/13/2016 7:41 AM Performed by: Ignacia Bayley Pre-anesthesia Checklist: Patient identified, Emergency Drugs available, Suction available and Patient being monitored Patient Re-evaluated:Patient Re-evaluated prior to inductionOxygen Delivery Method: Circle system utilized Preoxygenation: Pre-oxygenation with 100% oxygen Intubation Type: IV induction Ventilation: Mask ventilation without difficulty Laryngoscope Size: Miller and 2 Grade View: Grade II Tube type: Oral Tube size: 7.0 mm Number of attempts: 1 Airway Equipment and Method: Stylet Placement Confirmation: ETT inserted through vocal cords under direct vision,  positive ETCO2 and breath sounds checked- equal and bilateral Secured at: 20 cm Tube secured with: Tape Dental Injury: Teeth and Oropharynx as per pre-operative assessment

## 2016-02-13 NOTE — Op Note (Signed)
Intraoperative consult:  [name] [name] is undergoing a robotic sacrocolpopexy for vaginal prolapse by Dr. Jacqulynn Cadet.  She was found to have a small right inguinal hernia with the appendix stuck in the hernia defect.  The appendix and surrounding anatomy are otherwise normal.  She has started sewing the mesh in place.  The patient is prepped with trocars in place.  On PE with the abdomen distended, I was not able to feel the hernia, but I think it is an inguinal hernia (not a femoral hernia)  With mesh being sewn in place, I think any manipulation of the appendix where the appendix could rupture/open would increase the risk of mesh infection.  The hernia was unknown prior to surgery and thus asymptomatic.  I advised Dr. Dellis Filbert to finish the operation and let us see [name] [name] in our office in 4 to 6 weeks.  At that time, we can discuss the options of a hernia repair.  Dr. Dellis Filbert has taken pictures of the hernia/appendix.  [name], MD, [facility] Pager: [phone] Office phone:  [phone]

## 2016-02-13 NOTE — Anesthesia Postprocedure Evaluation (Signed)
Anesthesia Post Note  Patient: [name] [name]  Procedure(s) Performed: Procedure(s) (LRB): ROBOTIC ASSISTED LAPAROSCOPIC SACROCOLPOPEXY With Alyte Y-Mesh (N/A) ROBOTIC ASSISTED LAPAROSCOPIC LYSIS OF ADHESION  Patient location during evaluation: [facility] Anesthesia Type: General Level of consciousness: awake and alert Pain management: satisfactory to patient Vital Signs Assessment: post-procedure vital signs reviewed and stable Respiratory status: spontaneous breathing and respiratory function stable Cardiovascular status: stable Anesthetic complications: no Comments: The patient has been experiencing waves of nausea with vomiting  X 1 post Dilaudid for pain post-op.    Last Vitals:  Filed Vitals:   02/13/16 1415 02/13/16 1522  BP: 151/71 154/83  Pulse: 92 96  Temp: 36.7 C 36.4 C  Resp: 18 18    Last Pain:  Filed Vitals:   02/13/16 1803  PainSc: 6                  Deloria Brassfield

## 2016-02-13 NOTE — Op Note (Signed)
02/13/2016  12:21 PM  PATIENT:  [name] [name]  80 y.o. female  PRE-OPERATIVE DIAGNOSIS:  Vaginal Vault Prolapse Grade 3  POST-OPERATIVE DIAGNOSIS:  Vaginal Vault Prolapse Grade 3                                                         Adhesions between bowels, omentum and anterior abdominal wall                                                         Asymptomatic right hernia (inguinal or femoral) with incarcerated appendix  PROCEDURE:  Procedure(s): ROBOTIC ASSISTED LAPAROSCOPIC SACROCOLPOPEXY With Coloplast Y-Mesh ROBOTIC ASSISTED LAPAROSCOPIC LYSIS OF ADHESION Intra-operative consultation with Dr [name]  SURGEON:  Surgeon(s): Princess Bruins, MD Azucena Fallen, MD [name], MD  ASSISTANTS: Dr Azucena Fallen   ANESTHESIA:   general   PROCEDURE:  Under general anesthesia with endotracheal intubation the patient is an lithotomy position. She is prepped with DuraPrep on the abdomen and with Betadine on the suprapubic, vulvar and vaginal areas. A Foley catheter is inserted in the bladder. The patient is draped as usual.  The patient is status post total hysterectomy with bilateral salpingo-oophorectomy. She also had a rectoceale repair.  On gynecologic exam a complete vaginal prolapse is confirmed.  We go to the abdomen.  We infiltrate the subcutaneous tissue with Marcaine one quarter plain at the supraumbilical area.  We make a 1.5 cm incision with a scalpel at that level. The aponeurosis is grasped with Coker's and is opened transversely with Mayo scissors under direct vision.  The parietal peritoneum is opened bluntly with a finger. A pursestring stitch of Vicryl 0 was done on the aponeurosis. We inserted the [facility] at that level under direct vision and a pneumoperitoneum is created with CO2.  We note severe adhesions between the omentum and the anterior wall of the abdomen on the right side.  Those adhesions are exactly where the ports need to go in.  The left aspect of the  anterior abdominal wall is free of adhesions.  We therefore make two small incisions on the left side after infiltrating with Marcaine one quarter plain.  The two robotic ports are inserted under direct vision.  We also noted bowel adhesions with the bladder on the left side.  Finally, we noted the appendix incarcerated in a hernia on the right side.  We gently pulled on the appendix, but this was firmly incarcerated.  An intraoperative consultation with general surgery was requested at that time.  Pictures were taken of the hernia, which might be an inguinal or femoral hernia, ith the appendix incarcerated in it.  Note that the patient was asymptomatic from that, it was an incidental finding.  The pictures were sent to Gen. Surgery.  Dr. Redmond Pulling eventually called back, and informed us that he was working on sending a Education officer, environmental for intraoperative consultation.      We proceeded with lysis of adhesion by laparoscopy using the hot scissors and the [place].  The lysis of adhesions completely freed the anterior wall of the abdomen where ports needed to be placed.  We  therefore make time 2 small incisions on the right side after infiltrating with Marcaine one quarter plain.  The assistant ports a 5 mm port is entered at the lower incision and a robotic port is entered at the upper right incision under direct vision.  We placed the patient in deep Trendelenburg, which she tolerates well.  We docked the robot from the left side.  The robotic instruments are inserted under direct vision with the Endo Shears scissors and the first arm, the PK in the second arm and the prograsp in the third arm.  We go to the console.      We proceeded with lysis of adhesions between the bowels and the bladder on the left side.  The Coloplast intravaginal instrument was inserted in the vagina.  We opened the visceral peritoneum at the vaginal apex.  We proceeded with descent of the bladder to expose the anterior wall of the vagina.   From the apex to the bladder we had 5 cm.  We dissected the peritoneum from the posterior vaginal wall for about 4 cm.  The coloplast Y-mesh was cut at 4 cm anteriorly and 3 cm posteriorly.  It's was inserted in the abdomen.  We switched instruments to the cutting needle driver in the first arm and the mega  needle driver in the second arm. We kept the prograsp in the third arm.   We used a Gortex 0 in separate stitches to secure the mesh on the anterior and posterior vaginal walls.  6 stitches were applied anteriorly and 4 stitches were applied posteriorly.  We then switched the instruments again to the EndoShears scissor, PK and Prograsp for the sacral dissection.  The promontory of the sacrum was well identified  The right iliac vessels and the right ureter were also well identified.  The visceral peritoneum was lifted from the promontory of the sacrum and was opened.  We then continued opening of the visceral peritoneum all the way to the posterior vagina, creating a tunnel.  Hemostasis was completed were necessary with the tip of the Endo Shears scissors or the PK.  We further cleaned the promontory of the sacrum until the anterior sacral ligament was well visible.  Hemostasis was adequate at all levels.  We switched the instruments to the cutting needle driver in the first arm, the mega needle driver in the second arm and the prograsp in the third arm.  We went vaginally to evaluate the tension needed to lift the apex of the vagina appropriately.  The tail end of the Y-mesh was then trimmed according to that measurement.  We used Gortex again in separate stitches to secure the tail end of the Y mesh to the anterior ligament of the sacrum.  3 separate stitches were done at that level.  Hemostasis was adequate.  We then used a V lock 0 to close the peritoneum on top of the mesh starting at the level of the sacrum in the running suture.  We then finished closing the peritoneum at the level of the vagina and  bladder using a running suture of Vicryl 2-0.  The mesh was completely covered with peritoneum.  Pictures were taken.  Hemostasis was adequate at all levels.  The ureters were showing good peristalsis.  The urine was clear.  All 8 needles were parked on the right abdominal wall.      During that process, Dr. [name], general surgeon, came for the intraoperative consultation.  Please refer to his note.  He scrubbed to examine the patient and saw the inside aspect of it through the robotic camera.  I will send the patient to see him as an outpatient 4-6 weeks after surgery.      We then removed the robotic instruments. The robot was undocked.  We went to laparoscopy time.      We used the small 8 mm camera.  All 8 needles were removed from the abdomen through the [facility] port.  The Bupivicaine was poured and the abdominopelvic cavities.  We then removed the laparoscopic instruments under direct vision.  All ports were removed under direct vision.  The CO2 was evacuated.  The pursestring stitch was attached at the supraumbilical incision. All incisions were closed with subcuticular stitches of Vicryl 4-0.  Dermabond was added on all incisions.  Hemostasis was adequate at all incision sites.  We went vaginally to verify the suspension of the vagina which was excellent.  The small cystocele was also corrected by the suspension.        Dr. Benjie Karvonen then filled up the bladder with 400 cc of saline.  She pressed on the bladder.  Absolutely no fluid leakage occurred.  The decision was therefore taken not to proceed with the TVT.      The patient was brought to recovery room in good and stable status.                ESTIMATED BLOOD LOSS:  30 cc   Intake/Output Summary (Last 24 hours) at 02/13/16 1221 Last data filed at 02/13/16 1203  Gross per 24 hour  Intake   1800 ml  Output    775 ml  Net   1025 ml     BLOOD ADMINISTERED:none   LOCAL MEDICATIONS USED:  MARCAINE    and BUPIVICAINE   SPECIMEN:  No  Specimen  DISPOSITION OF SPECIMEN:  N/A  COUNTS:  YES  PLAN OF CARE: Transfer to PACU  Princess Bruins MD  02/13/2016 at 12:27 pm

## 2016-02-13 NOTE — Addendum Note (Signed)
Addendum  created 02/13/16 1840 by Flossie Dibble, CRNA   Modules edited: Notes Section, Orders   Notes Section:  File: [id number]

## 2016-02-14 ENCOUNTER — Encounter (HOSPITAL_COMMUNITY): Payer: Self-pay | Admitting: Obstetrics & Gynecology

## 2016-02-14 DIAGNOSIS — G4730 Sleep apnea, unspecified: Secondary | ICD-10-CM | POA: Diagnosis not present

## 2016-02-14 DIAGNOSIS — N993 Prolapse of vaginal vault after hysterectomy: Secondary | ICD-10-CM | POA: Diagnosis not present

## 2016-02-14 DIAGNOSIS — Z87891 Personal history of nicotine dependence: Secondary | ICD-10-CM | POA: Diagnosis not present

## 2016-02-14 DIAGNOSIS — Z86010 Personal history of colonic polyps: Secondary | ICD-10-CM | POA: Diagnosis not present

## 2016-02-14 DIAGNOSIS — K219 Gastro-esophageal reflux disease without esophagitis: Secondary | ICD-10-CM | POA: Diagnosis not present

## 2016-02-14 DIAGNOSIS — Z8673 Personal history of transient ischemic attack (TIA), and cerebral infarction without residual deficits: Secondary | ICD-10-CM | POA: Diagnosis not present

## 2016-02-14 DIAGNOSIS — K4090 Unilateral inguinal hernia, without obstruction or gangrene, not specified as recurrent: Secondary | ICD-10-CM | POA: Diagnosis not present

## 2016-02-14 DIAGNOSIS — I739 Peripheral vascular disease, unspecified: Secondary | ICD-10-CM | POA: Diagnosis not present

## 2016-02-14 LAB — CBC
HCT: 36.7 % (ref 36.0–46.0)
Hemoglobin: 12.1 g/dL (ref 12.0–15.0)
MCH: 28 pg (ref 26.0–34.0)
MCHC: 33 g/dL (ref 30.0–36.0)
MCV: 85 fL (ref 78.0–100.0)
Platelets: 225 10*3/uL (ref 150–400)
RBC: 4.32 MIL/uL (ref 3.87–5.11)
RDW: 13.5 % (ref 11.5–15.5)
WBC: 11.5 10*3/uL — ABNORMAL HIGH (ref 4.0–10.5)

## 2016-02-14 NOTE — Discharge Summary (Signed)
  Physician Discharge Summary  Patient ID: [name] [name] MRN: [id number] DOB/AGE: 12-05-1935 80 y.o.  Admit date: 02/13/2016 Discharge date: 02/14/2016  Admission Diagnoses: Vaginal Vault Prolapse Grade 3  Discharge Diagnoses: Vaginal Vault Prolapse Grade 3, bowel and omental adhesions, right hernia with asymptomatic incarceration of appendix        Active Problems:   Postoperative state   Discharged Condition: good  Hospital Course: good  Consults: general surgery intraoperatively  Treatments: surgery: Robotic Sacrocolpopexy, Lysis of Adhesions  Disposition: 01-Home or Self Care     Medication List    TAKE these medications        Biotin 1 MG Caps  Take 1 capsule by mouth every morning. Reported on 12/12/2015     CENTRUM SILVER ADULT 50+ Tabs  Take 1 tablet by mouth every morning. Reported on 12/12/2015     PRESERVISION AREDS PO  Take 1 tablet by mouth daily. Reported on 12/12/2015     gabapentin 300 MG capsule  Commonly known as:  NEURONTIN  Take 1 capsule by mouth at bedtime.     omeprazole 20 MG capsule  Commonly known as:  PRILOSEC  Take 1 capsule (20 mg total) by mouth 2 (two) times daily.     triamcinolone cream 0.1 %  Commonly known as:  KENALOG  Apply 1 application topically daily as needed (for itching).           Follow-up Information    Follow up with Harshil Cavallaro,[name], MD In 3 weeks.   Specialty:  Obstetrics and Gynecology   Contact information:   [place] [id number] [phone]       Signed: Princess Bruins, MD 02/14/2016, 1:02 PM

## 2016-02-14 NOTE — Progress Notes (Signed)
Discharge teaching complete. Pt understood all information and did not have any questions. Pt pushed via wheelchair out of the hospital and discharged home to family.  

## 2016-02-14 NOTE — Progress Notes (Signed)
1 Day Post-Op Procedure(s) (LRB): ROBOTIC ASSISTED LAPAROSCOPIC SACROCOLPOPEXY With Alyte Y-Mesh (N/A) ROBOTIC ASSISTED LAPAROSCOPIC LYSIS OF ADHESION  Subjective: Patient reports that pain is well managed.  Tolerating normal diet as tolerated  diet without difficulty. No nausea / vomiting.  Ambulating and voiding.  Objective: BP 104/61 mmHg  Pulse 72  Temp(Src) 97.9 F (36.6 C) (Oral)  Resp 16  Ht 5\' 4"  (1.626 m)  Wt 134 lb (60.782 kg)  BMI 22.99 kg/m2  SpO2 97% Lungs: clear Heart: normal rate and rhythm Abdomen:soft and appropriately tender Extremities: Homans sign is negative, no sign of DVT Incision: healing well  Results for BERENIS, CENAC (MRN [id number]) as of 02/14/2016 10:57  Ref. Range 02/14/2016 05:17  WBC Latest Ref Range: 4.0-10.5 K/uL 11.5 (H)  RBC Latest Ref Range: 3.87-5.11 MIL/uL 4.32  Hemoglobin Latest Ref Range: 12.0-15.0 g/dL 12.1  HCT Latest Ref Range: 36.0-46.0 % 36.7  MCV Latest Ref Range: 78.0-100.0 fL 85.0  MCH Latest Ref Range: 26.0-34.0 pg 28.0  MCHC Latest Ref Range: 30.0-36.0 g/dL 33.0  RDW Latest Ref Range: 11.5-15.5 % 13.5  Platelets Latest Ref Range: 150-400 K/uL 225    Assessment: s/p Procedure(s): ROBOTIC ASSISTED LAPAROSCOPIC SACROCOLPOPEXY With Alyte Y-Mesh ROBOTIC ASSISTED LAPAROSCOPIC LYSIS OF ADHESION: progressing well  Plan: Discharge home     [place] 02/14/2016, 10:57 AM

## 2016-02-14 NOTE — Discharge Instructions (Signed)
Sacrocolpopexy, Care After °Refer to this sheet in the next few weeks. These instructions provide you with information on caring for yourself after your procedure. Your health care provider may also give you more specific instructions. Your treatment has been planned according to current medical practices, but problems sometimes occur. Call your health care provider if you have any problems or questions after your procedure.  °WHAT TO EXPECT AFTER THE PROCEDURE  °After your procedure, it is typical to have the following: °· Pain. °· Some vaginal bleeding. °· Fatigue. °HOME CARE INSTRUCTIONS  °Medicine °· Only take medicines as directed by your health care provider. °· Make sure to finish antibiotic medicine even if you start to feel better. °Bandages °· Care for your bandages and cuts (incisions) as directed by your surgeon. °· Do not shower until after your bandages have been removed. °Activity °· Take at least two 10-minute walks each day. °· Ask your health care provider when you can return to work and do all your usual activities. It may take 3 months to recover completely. °· You will have to restrict many activities when you first return home. For at least 6 weeks: °¨ Do not lift anything heavier than a gallon of milk. °¨ Do not sit on a bike seat. °¨ Do not use a tampon or put anything inside your vagina. °¨ Do not have sexual intercourse. °¨ Do not participate in strenuous activities like running or aerobics. °Other Instructions °· Do not take a bath or go swimming until your health care provider says you can. Most people can shower after about 6 weeks after the procedure. °· Drink enough fluids to keep your urine clear or pale yellow. This helps prevent constipation. °· See your health care provider to have your stitches or staples removed if necessary. °· Keep all follow-up appointments. °SEEK MEDICAL CARE IF:  °· You have chills or fever. °· Your pain medicine is not helping. °· You have vaginal bleeding  or discharge that will not go away. °SEEK IMMEDIATE MEDICAL CARE IF:  °· You have a fever for more than 2-3 days. °· You have very bad pain. °· You have heavy vaginal bleeding or discharge. °· You have any signs of infection around your cut (incision). Watch for: °¨ Redness. °¨ Tenderness. °¨ Warmth. °¨ Pus or other discharge. °· You develop a warm, tender area in your leg. °· You have chest pain or trouble breathing. °  °This information is not intended to replace advice given to you by your health care provider. Make sure you discuss any questions you have with your health care provider. °  °Document Released: 11/21/2013 Document Reviewed: 11/21/2013 °Elsevier Interactive Patient Education ©2016 [employer] Inc[employer] ° °

## 2016-04-08 DIAGNOSIS — K4091 Unilateral inguinal hernia, without obstruction or gangrene, recurrent: Secondary | ICD-10-CM | POA: Diagnosis not present

## 2016-05-19 DIAGNOSIS — R51 Headache: Secondary | ICD-10-CM | POA: Diagnosis not present

## 2016-05-19 DIAGNOSIS — M791 Myalgia: Secondary | ICD-10-CM | POA: Diagnosis not present

## 2016-05-19 DIAGNOSIS — M542 Cervicalgia: Secondary | ICD-10-CM | POA: Diagnosis not present

## 2016-05-19 DIAGNOSIS — G518 Other disorders of facial nerve: Secondary | ICD-10-CM | POA: Diagnosis not present

## 2016-05-19 DIAGNOSIS — G43719 Chronic migraine without aura, intractable, without status migrainosus: Secondary | ICD-10-CM | POA: Diagnosis not present

## 2016-06-11 DIAGNOSIS — G518 Other disorders of facial nerve: Secondary | ICD-10-CM | POA: Diagnosis not present

## 2016-06-11 DIAGNOSIS — M791 Myalgia: Secondary | ICD-10-CM | POA: Diagnosis not present

## 2016-06-11 DIAGNOSIS — M542 Cervicalgia: Secondary | ICD-10-CM | POA: Diagnosis not present

## 2016-06-11 DIAGNOSIS — G43719 Chronic migraine without aura, intractable, without status migrainosus: Secondary | ICD-10-CM | POA: Diagnosis not present

## 2016-06-11 DIAGNOSIS — R51 Headache: Secondary | ICD-10-CM | POA: Diagnosis not present

## 2016-07-02 DIAGNOSIS — M542 Cervicalgia: Secondary | ICD-10-CM | POA: Diagnosis not present

## 2016-07-02 DIAGNOSIS — G518 Other disorders of facial nerve: Secondary | ICD-10-CM | POA: Diagnosis not present

## 2016-07-02 DIAGNOSIS — R51 Headache: Secondary | ICD-10-CM | POA: Diagnosis not present

## 2016-07-02 DIAGNOSIS — M791 Myalgia: Secondary | ICD-10-CM | POA: Diagnosis not present

## 2016-07-02 DIAGNOSIS — G43719 Chronic migraine without aura, intractable, without status migrainosus: Secondary | ICD-10-CM | POA: Diagnosis not present

## 2016-07-21 DIAGNOSIS — G43719 Chronic migraine without aura, intractable, without status migrainosus: Secondary | ICD-10-CM | POA: Diagnosis not present

## 2016-07-21 DIAGNOSIS — R51 Headache: Secondary | ICD-10-CM | POA: Diagnosis not present

## 2016-07-21 DIAGNOSIS — M791 Myalgia: Secondary | ICD-10-CM | POA: Diagnosis not present

## 2016-07-21 DIAGNOSIS — G518 Other disorders of facial nerve: Secondary | ICD-10-CM | POA: Diagnosis not present

## 2016-07-21 DIAGNOSIS — M542 Cervicalgia: Secondary | ICD-10-CM | POA: Diagnosis not present

## 2016-09-03 DIAGNOSIS — L814 Other melanin hyperpigmentation: Secondary | ICD-10-CM | POA: Diagnosis not present

## 2016-09-03 DIAGNOSIS — L738 Other specified follicular disorders: Secondary | ICD-10-CM | POA: Diagnosis not present

## 2016-09-03 DIAGNOSIS — L570 Actinic keratosis: Secondary | ICD-10-CM | POA: Diagnosis not present

## 2016-09-03 DIAGNOSIS — L720 Epidermal cyst: Secondary | ICD-10-CM | POA: Diagnosis not present

## 2016-09-03 DIAGNOSIS — L853 Xerosis cutis: Secondary | ICD-10-CM | POA: Diagnosis not present

## 2016-09-12 DIAGNOSIS — Z23 Encounter for immunization: Secondary | ICD-10-CM | POA: Diagnosis not present

## 2016-10-12 DIAGNOSIS — H353134 Nonexudative age-related macular degeneration, bilateral, advanced atrophic with subfoveal involvement: Secondary | ICD-10-CM | POA: Diagnosis not present

## 2016-10-12 DIAGNOSIS — H35363 Drusen (degenerative) of macula, bilateral: Secondary | ICD-10-CM | POA: Diagnosis not present

## 2016-10-12 DIAGNOSIS — H43813 Vitreous degeneration, bilateral: Secondary | ICD-10-CM | POA: Diagnosis not present

## 2016-10-27 DIAGNOSIS — G43719 Chronic migraine without aura, intractable, without status migrainosus: Secondary | ICD-10-CM | POA: Diagnosis not present

## 2016-12-11 DIAGNOSIS — M542 Cervicalgia: Secondary | ICD-10-CM | POA: Diagnosis not present

## 2016-12-11 DIAGNOSIS — Z6823 Body mass index (BMI) 23.0-23.9, adult: Secondary | ICD-10-CM | POA: Diagnosis not present

## 2016-12-14 DIAGNOSIS — R8299 Other abnormal findings in urine: Secondary | ICD-10-CM | POA: Diagnosis not present

## 2016-12-14 DIAGNOSIS — R030 Elevated blood-pressure reading, without diagnosis of hypertension: Secondary | ICD-10-CM | POA: Diagnosis not present

## 2016-12-14 DIAGNOSIS — E781 Pure hyperglyceridemia: Secondary | ICD-10-CM | POA: Diagnosis not present

## 2016-12-21 DIAGNOSIS — Z0000 Encounter for general adult medical examination without abnormal findings: Secondary | ICD-10-CM | POA: Diagnosis not present

## 2016-12-21 DIAGNOSIS — M542 Cervicalgia: Secondary | ICD-10-CM | POA: Diagnosis not present

## 2016-12-21 DIAGNOSIS — R51 Headache: Secondary | ICD-10-CM | POA: Diagnosis not present

## 2016-12-21 DIAGNOSIS — Z1389 Encounter for screening for other disorder: Secondary | ICD-10-CM | POA: Diagnosis not present

## 2016-12-21 DIAGNOSIS — E781 Pure hyperglyceridemia: Secondary | ICD-10-CM | POA: Diagnosis not present

## 2016-12-21 DIAGNOSIS — R030 Elevated blood-pressure reading, without diagnosis of hypertension: Secondary | ICD-10-CM | POA: Diagnosis not present

## 2016-12-21 DIAGNOSIS — G4733 Obstructive sleep apnea (adult) (pediatric): Secondary | ICD-10-CM | POA: Diagnosis not present

## 2016-12-21 DIAGNOSIS — Z6823 Body mass index (BMI) 23.0-23.9, adult: Secondary | ICD-10-CM | POA: Diagnosis not present

## 2017-01-05 DIAGNOSIS — Z1212 Encounter for screening for malignant neoplasm of rectum: Secondary | ICD-10-CM | POA: Diagnosis not present

## 2017-01-20 ENCOUNTER — Other Ambulatory Visit: Payer: Self-pay | Admitting: Internal Medicine

## 2017-02-17 ENCOUNTER — Other Ambulatory Visit: Payer: Self-pay | Admitting: Internal Medicine

## 2017-02-17 DIAGNOSIS — Z1231 Encounter for screening mammogram for malignant neoplasm of breast: Secondary | ICD-10-CM

## 2017-02-25 DIAGNOSIS — G43719 Chronic migraine without aura, intractable, without status migrainosus: Secondary | ICD-10-CM | POA: Diagnosis not present

## 2017-03-11 ENCOUNTER — Ambulatory Visit
Admission: RE | Admit: 2017-03-11 | Discharge: 2017-03-11 | Disposition: A | Discharge status: Home / Self Care | Payer: Medicare HMO | Source: Ambulatory Visit | Attending: Internal Medicine | Admitting: Internal Medicine

## 2017-03-11 DIAGNOSIS — Z1231 Encounter for screening mammogram for malignant neoplasm of breast: Secondary | ICD-10-CM | POA: Diagnosis not present

## 2017-03-26 ENCOUNTER — Encounter: Payer: Self-pay | Admitting: Internal Medicine

## 2017-03-26 ENCOUNTER — Ambulatory Visit (INDEPENDENT_AMBULATORY_CARE_PROVIDER_SITE_OTHER): Payer: Medicare HMO | Admitting: Internal Medicine

## 2017-03-26 VITALS — BP 128/78 | HR 68 | Ht 64.0 in | Wt 139.0 lb

## 2017-03-26 DIAGNOSIS — K219 Gastro-esophageal reflux disease without esophagitis: Secondary | ICD-10-CM

## 2017-03-26 DIAGNOSIS — K222 Esophageal obstruction: Secondary | ICD-10-CM | POA: Diagnosis not present

## 2017-03-26 MED ORDER — OMEPRAZOLE 20 MG PO CPDR: 20.0000 mg | DELAYED_RELEASE_CAPSULE | Freq: Two times a day (BID) | ORAL | 3 refills | Status: DC

## 2017-03-26 NOTE — Patient Instructions (Signed)
We have sent the following medications to your pharmacy for you to pick up at your convenience:  Omeprazole.  Please follow up in one year  

## 2017-03-26 NOTE — Progress Notes (Signed)
HISTORY OF PRESENT ILLNESS:  [name] [name] is a 81 y.o. female with migraine headaches, hyperlipidemia, macular degeneration, and prior lacunar infarct. She is followed in this office for GERD, complicated by peptic stricture which has required esophageal dilation and adenomatous colon polyps. She is also status post cholecystectomy for symptomatic gallbladder disease. She was last evaluated January 2017 for chronic GERD, intermittent solid food dysphagia, and musculoskeletal type right upper quadrant pain. See that dictation. She subsequently underwent upper endoscopy on 12/12/2015. Benign distal esophageal stricture was dilated with 54 French Maloney dilator. She was continued on omeprazole 20 mg daily. She presents today for follow-up. She continues on omeprazole 20 mg daily. Occasionally needs 40 mg for breakthrough symptoms. Her dysphagia has resolved. She requests medication refill. GI review of systems today is totally negative. Her last complete colonoscopy was performed April 2015. Severe diverticulosis with stenosis but no polyps. No follow-up surveillance recommended secondary to age and favorable findings.  REVIEW OF SYSTEMS:  All non-GI ROS unless otherwise stated in history of present illness negative except for headaches, arthritis  Past Medical History:  Diagnosis Date  . Acid reflux disease   . Anemia    history of  . Arthritis    both hands  . Blood transfusion without reported diagnosis   . Cataract    bilateral cateracts removed  . Common migraine   . Diverticulosis    patient denies  . Esophageal stricture   . Herniated disc   . Hiatal hernia 2009   EGD-Small   . History of colonic polyps   . Hyperlipidemia   . Lacunar infarction ([place])   . Macular degeneration   . Sleep apnea    has c-pap but does not wear    Past Surgical History:  Procedure Laterality Date  . CHOLECYSTECTOMY     4-5 YRS AGO.  [facility] Kitchen COLONOSCOPY    . ESOPHAGEAL DILATION    . EYE SURGERY      BILATERAL CATARAT  . INGUINAL HERNIA REPAIR     rt.  Marland Kitchen RECTOCELE REPAIR N/A 02/13/2013   Procedure: POSTERIOR REPAIR (RECTOCELE);  Surgeon: Emily Filbert, MD;  Location: [facility];  Service: Gynecology;  Laterality: N/A;  . ROBOTIC ASSISTED LAPAROSCOPIC LYSIS OF ADHESION  02/13/2016   Procedure: ROBOTIC ASSISTED LAPAROSCOPIC LYSIS OF ADHESION;  Surgeon: Princess Bruins, MD;  Location: [facility];  Service: Gynecology;;  . ROBOTIC ASSISTED LAPAROSCOPIC SACROCOLPOPEXY N/A 02/13/2016   Procedure: ROBOTIC ASSISTED LAPAROSCOPIC SACROCOLPOPEXY With Theola Sequin;  Surgeon: Princess Bruins, MD;  Location: [facility];  Service: Gynecology;  Laterality: N/A;  . TOTAL ABDOMINAL HYSTERECTOMY    . UPPER GASTROINTESTINAL ENDOSCOPY      Social History [name] [name]  reports that she has quit smoking. Her smoking use included Cigarettes. She quit after 10.00 years of use. She has never used smokeless tobacco. She reports that she does not drink alcohol or use drugs.  family history includes Aneurysm (age of onset: 55) in her sister; Breast cancer in her maternal aunt and paternal aunt; Cancer in her brother; Colon cancer in her maternal aunt.  Allergies  Allergen Reactions  . Isometheptene-Dichloral-Apap Nausea Only    (midrin)  . Oxycodone Hcl Nausea And Vomiting    Pt reports tolerating vicodin       PHYSICAL EXAMINATION: Vital signs: BP 128/78   Pulse 68   Ht 5\' 4"  (1.626 m)   Wt 139 lb (63 kg)   SpO2 98%   BMI 23.86 kg/m   Constitutional:  generally well-appearing, no acute distress Psychiatric: alert and oriented x3, cooperative Eyes: extraocular movements intact, anicteric, conjunctiva pink Mouth: oral pharynx moist, no lesions Neck: supple no lymphadenopathy Cardiovascular: heart regular rate and rhythm, no murmur Lungs: clear to auscultation bilaterally Abdomen: soft, nontender, nondistended, no obvious ascites, no peritoneal signs, normal bowel sounds, no  organomegaly Rectal:Omitted Extremities: no clubbing cyanosis or lower extremity edema bilaterally Skin: no lesions on visible extremities Neuro: No focal deficits. Cranial nerves intact  ASSESSMENT:  #1. GERD, complicated by peptic stricture. Currently asymptomatic on PPI post dilation #2. History of colon polyps. Last examination 2015 negative. Aged of surveillance #3. Status post cholecystectomy   PLAN:  #1. Reflux precautions #2. Refill omeprazole 20 mg daily. Okay to use an additional omeprazole as needed for breakthrough symptoms #3. Contact the office for recurrent dysphagia or other issues #4. Otherwise routine GI follow-up one year  15 minutes spent face-to-face with the patient. Greater than 50% a time use for counseling regarding her GERD, complicated by peptic stricture and its management and follow-up

## 2017-07-12 DIAGNOSIS — Z961 Presence of intraocular lens: Secondary | ICD-10-CM | POA: Diagnosis not present

## 2017-07-12 DIAGNOSIS — H43813 Vitreous degeneration, bilateral: Secondary | ICD-10-CM | POA: Diagnosis not present

## 2017-07-12 DIAGNOSIS — H35363 Drusen (degenerative) of macula, bilateral: Secondary | ICD-10-CM | POA: Diagnosis not present

## 2017-07-12 DIAGNOSIS — H353134 Nonexudative age-related macular degeneration, bilateral, advanced atrophic with subfoveal involvement: Secondary | ICD-10-CM | POA: Diagnosis not present

## 2017-07-24 ENCOUNTER — Emergency Department (HOSPITAL_COMMUNITY): Payer: Medicare HMO

## 2017-07-24 ENCOUNTER — Emergency Department (HOSPITAL_COMMUNITY)
Admission: EM | Admit: 2017-07-24 | Discharge: 2017-07-25 | Disposition: A | Discharge status: Home / Self Care | Payer: Medicare HMO | Attending: Emergency Medicine | Admitting: Emergency Medicine

## 2017-07-24 ENCOUNTER — Encounter (HOSPITAL_COMMUNITY): Payer: Self-pay

## 2017-07-24 DIAGNOSIS — J90 Pleural effusion, not elsewhere classified: Secondary | ICD-10-CM | POA: Diagnosis not present

## 2017-07-24 DIAGNOSIS — Z87891 Personal history of nicotine dependence: Secondary | ICD-10-CM | POA: Diagnosis not present

## 2017-07-24 DIAGNOSIS — J181 Lobar pneumonia, unspecified organism: Secondary | ICD-10-CM | POA: Insufficient documentation

## 2017-07-24 DIAGNOSIS — J189 Pneumonia, unspecified organism: Secondary | ICD-10-CM

## 2017-07-24 DIAGNOSIS — R0600 Dyspnea, unspecified: Secondary | ICD-10-CM | POA: Diagnosis not present

## 2017-07-24 DIAGNOSIS — Z79899 Other long term (current) drug therapy: Secondary | ICD-10-CM | POA: Diagnosis not present

## 2017-07-24 DIAGNOSIS — R0602 Shortness of breath: Secondary | ICD-10-CM | POA: Diagnosis present

## 2017-07-24 DIAGNOSIS — R079 Chest pain, unspecified: Secondary | ICD-10-CM | POA: Diagnosis not present

## 2017-07-24 LAB — BASIC METABOLIC PANEL
Anion gap: 10 (ref 5–15)
BUN: 8 mg/dL (ref 6–20)
CO2: 25 mmol/L (ref 22–32)
Calcium: 9 mg/dL (ref 8.9–10.3)
Chloride: 101 mmol/L (ref 101–111)
Creatinine, Ser: 0.71 mg/dL (ref 0.44–1.00)
GFR calc Af Amer: >60 mL/min (ref >60)
GFR calc non Af Amer: >60 mL/min (ref >60)
Glucose, Bld: 91 mg/dL (ref 65–99)
Potassium: 3.7 mmol/L (ref 3.5–5.1)
Sodium: 136 mmol/L (ref 135–145)

## 2017-07-24 LAB — CBC
HCT: 39.8 % (ref 36.0–46.0)
Hemoglobin: 13 g/dL (ref 12.0–15.0)
MCH: 27.4 pg (ref 26.0–34.0)
MCHC: 32.7 g/dL (ref 30.0–36.0)
MCV: 83.8 fL (ref 78.0–100.0)
Platelets: 289 10*3/uL (ref 150–400)
RBC: 4.75 MIL/uL (ref 3.87–5.11)
RDW: 13.3 % (ref 11.5–15.5)
WBC: 10.5 10*3/uL (ref 4.0–10.5)

## 2017-07-24 LAB — D-DIMER, QUANTITATIVE: D-Dimer, Quant: 1.3 ug/mL-FEU — ABNORMAL HIGH (ref 0.00–0.50)

## 2017-07-24 LAB — I-STAT TROPONIN, ED: Troponin i, poc: 0 ng/mL (ref 0.00–0.08)

## 2017-07-24 MED ORDER — FENTANYL CITRATE (PF) 100 MCG/2ML IJ SOLN
50.0000 ug | Freq: Once | INTRAMUSCULAR | Status: AC
  Administered 2017-07-24: 50 ug via INTRAVENOUS
  Filled 2017-07-24: qty 2

## 2017-07-24 NOTE — ED Provider Notes (Signed)
[facility] DEPT Provider Note   CSN: [id number] Arrival date & time: 07/24/17  1849     History   Chief Complaint Chief Complaint  Patient presents with  . Chest Pain  . Shortness of Breath    HPI [name] [name] is a 81 y.o. female.  Patient presents to the ED with a chief complaint of SOB and chest pain.  She states that the symptoms started 5-6 days ago.  She reports that the pain has gradually worsened along with her SOB.  She denies any fever, chills, cough, or congestion.  She has not taken anything for her symptoms.  She reports that she did drive 5 hours to the beach about 3 weeks ago, but denies any leg swelling or calf pain.  She denies any immobilization or surgeries.  There are no other associated symptoms.    The history is provided by the patient. No language interpreter was used.    Past Medical History:  Diagnosis Date  . Acid reflux disease   . Anemia    history of  . Arthritis    both hands  . Blood transfusion without reported diagnosis   . Cataract    bilateral cateracts removed  . Common migraine   . Diverticulosis    patient denies  . Esophageal stricture   . Herniated disc   . Hiatal hernia 2009   EGD-Small   . History of colonic polyps   . Hyperlipidemia   . Lacunar infarction ([place])   . Macular degeneration   . Sleep apnea    has c-pap but does not wear    Patient Active Problem List   Diagnosis Date Noted  . Postoperative state 02/13/2016  . Chest pain with moderate risk of acute coronary syndrome 10/08/2014  . Sepsis syndrome 06/26/2013  . Acute pyelonephritis 06/26/2013  . Migraine 03/28/2013  . Rectocele 02/13/2013  . Sleep apnea 02/16/2012  . Chronic daily headache 01/12/2012  . OSTEOPENIA 10/17/2008  . ESOPHAGEAL STRICTURE 05/02/2008  . DIVERTICULOSIS-COLON 05/02/2008  . DIVERTICULITIS-COLON 05/02/2008  . ABDOMINAL PAIN-EPIGASTRIC 05/02/2008  . GASTRITIS, ACUTE 03/27/2008  . LEG CRAMPS, NOCTURNAL 02/10/2008  .  Headache(784.0) 02/10/2008  . OTHER DYSPHAGIA 02/10/2008  . HYPERLIPIDEMIA 02/06/2008  . LACUNAR INFARCTION 02/06/2008  . COLONIC POLYPS 10/13/2007  . Migraine without aura 09/27/2007  . Esophageal reflux 09/27/2007  . HERNIATED DISC 09/27/2007    Past Surgical History:  Procedure Laterality Date  . CHOLECYSTECTOMY     4-5 YRS AGO.  Marland Kitchen COLONOSCOPY    . ESOPHAGEAL DILATION    . EYE SURGERY     BILATERAL CATARAT  . INGUINAL HERNIA REPAIR     rt.  Marland Kitchen RECTOCELE REPAIR N/A 02/13/2013   Procedure: POSTERIOR REPAIR (RECTOCELE);  Surgeon: Emily Filbert, MD;  Location: [facility];  Service: Gynecology;  Laterality: N/A;  . ROBOTIC ASSISTED LAPAROSCOPIC LYSIS OF ADHESION  02/13/2016   Procedure: ROBOTIC ASSISTED LAPAROSCOPIC LYSIS OF ADHESION;  Surgeon: Princess Bruins, MD;  Location: [facility];  Service: Gynecology;;  . ROBOTIC ASSISTED LAPAROSCOPIC SACROCOLPOPEXY N/A 02/13/2016   Procedure: ROBOTIC ASSISTED LAPAROSCOPIC SACROCOLPOPEXY With Theola Sequin;  Surgeon: Princess Bruins, MD;  Location: [facility];  Service: Gynecology;  Laterality: N/A;  . TOTAL ABDOMINAL HYSTERECTOMY    . UPPER GASTROINTESTINAL ENDOSCOPY      OB History    Gravida Para Term Preterm AB Living   3 3       3    SAB TAB Ectopic Multiple Live Births  Home Medications    Prior to Admission medications   Medication Sig Start Date End Date Taking? Authorizing Provider  Biotin 1 MG CAPS Take 1 capsule by mouth every morning. Reported on 12/12/2015    [provider]  gabapentin (NEURONTIN) 300 MG capsule Take 1 capsule by mouth at bedtime. 11/20/15   [provider]  Multiple Vitamins-Minerals (CENTRUM SILVER ADULT 50+) TABS Take 1 tablet by mouth every morning. Reported on 12/12/2015    [provider]  omeprazole (PRILOSEC) 20 MG capsule Take 1 capsule (20 mg total) by mouth 2 (two) times daily. 03/26/17   Irene Shipper, MD    Family History Family History  Problem Relation Age  of Onset  . Breast cancer Maternal Aunt   . Colon cancer Maternal Aunt   . Cancer Brother        kidney cancer  . Aneurysm Sister 68       died   . Breast cancer Paternal Aunt   . Esophageal cancer Neg Hx   . Rectal cancer Neg Hx   . Stomach cancer Neg Hx     Social History Social History  Substance Use Topics  . Smoking status: Former Smoker    Years: 10.00    Types: Cigarettes  . Smokeless tobacco: Never Used  . Alcohol use No     Allergies   Isometheptene-dichloral-apap and Oxycodone hcl   Review of Systems Review of Systems  All other systems reviewed and are negative.    Physical Exam Updated Vital Signs BP (!) 144/84 (BP Location: Left Arm)   Pulse (!) 107   Temp 98.9 F (37.2 C) (Oral)   Resp 18   SpO2 96%   Physical Exam  Constitutional: She is oriented to person, place, and time. She appears well-developed and well-nourished.  HENT:  Head: Normocephalic and atraumatic.  Eyes: Pupils are equal, round, and reactive to light. Conjunctivae and EOM are normal.  Neck: Normal range of motion. Neck supple.  Cardiovascular: Regular rhythm.  Exam reveals no gallop and no friction rub.   No murmur heard. tachycardic  Pulmonary/Chest: Effort normal and breath sounds normal. No respiratory distress. She has no wheezes. She has no rales. She exhibits no tenderness.  Abdominal: Soft. Bowel sounds are normal. She exhibits no distension and no mass. There is no tenderness. There is no rebound and no guarding.  Musculoskeletal: Normal range of motion. She exhibits no edema or tenderness.  Neurological: She is alert and oriented to person, place, and time.  Skin: Skin is warm and dry.  Psychiatric: She has a normal mood and affect. Her behavior is normal. Judgment and thought content normal.  Nursing note and vitals reviewed.    ED Treatments / Results  Labs (all labs ordered are listed, but only abnormal results are displayed) Labs Reviewed  BASIC METABOLIC  PANEL  CBC  D-DIMER, QUANTITATIVE (NOT AT [facility])  I-STAT TROPONIN, ED    EKG  EKG Interpretation None       Radiology Dg Chest 2 View  Result Date: 07/24/2017 CLINICAL DATA:  Chest pain EXAM: CHEST  2 VIEW COMPARISON:  10/08/2014 FINDINGS: Cardiac shadow is within normal limits. Lungs are well aerated bilaterally. Left lower lobe infiltrate with associated effusion is noted. No bony abnormality is seen. IMPRESSION: Left basilar infiltrate. Electronically Signed   By: Inez Catalina M.D.   On: 07/24/2017 21:52    Procedures Procedures (including critical care time)  Medications Ordered in ED Medications - No data  to display   Initial Impression / Assessment and Plan / ED Course  I have reviewed the triage vital signs and the nursing notes.  Pertinent labs & imaging results that were available during my care of the patient were reviewed by me and considered in my medical decision making (see chart for details).     Patient with CP, SOB x 5-6 days.  Basic labs are normal.  CXR shows left basilar infiltrate, however no cough or fever.  Will check D-dimer.  Hx of recent long travel.  Seen by and discussed with Dr. Tamera Punt, who agrees with the plan.  1:32 AM CT PE study shows no acute pulmonary embolus. She does have bilateral pleural effusions, as well as some airspace opacities on the left which could be pneumonia. I will treat her with Levaquin and recommend close follow-up with her primary care doctor. She is able to ambulate maintaining greater than 90% pulse oxygenation. She feels stable for discharge. Return precautions given.  Final Clinical Impressions(s) / ED Diagnoses   Final diagnoses:  Community acquired pneumonia of left lower lobe of lung (HCC)  Pleural effusion    New Prescriptions New Prescriptions   LEVOFLOXACIN (LEVAQUIN) 750 MG TABLET    Take 1 tablet (750 mg total) by mouth daily.     Montine Circle, PA-C 07/25/17 1245    Malvin Johns, MD 07/25/17  1606

## 2017-07-24 NOTE — ED Triage Notes (Signed)
Onset 3-4 days ago shortness of breath, when pt tries to take deep breath pain underneath left breast and radiates to left lateral side and back.   No respiratory difficulties. Talking in complete sentences.

## 2017-07-24 NOTE — ED Notes (Signed)
Pt ambulated efficiently while maintaining POX above 90%. Pt stated she felt some soreness in her chest prior to ambulation. RN notified.

## 2017-07-25 ENCOUNTER — Emergency Department (HOSPITAL_COMMUNITY): Payer: Medicare HMO

## 2017-07-25 DIAGNOSIS — R0600 Dyspnea, unspecified: Secondary | ICD-10-CM | POA: Diagnosis not present

## 2017-07-25 MED ORDER — GABAPENTIN 300 MG PO CAPS
300.0000 mg | ORAL_CAPSULE | Freq: Once | ORAL | Status: AC
  Administered 2017-07-25: 300 mg via ORAL
  Filled 2017-07-25: qty 1

## 2017-07-25 MED ORDER — IOPAMIDOL (ISOVUE-370) INJECTION 76%
INTRAVENOUS | Status: AC
  Administered 2017-07-25: 100 mL
  Filled 2017-07-25: qty 100

## 2017-07-25 MED ORDER — LEVOFLOXACIN 750 MG PO TABS: 750.0000 mg | ORAL_TABLET | Freq: Every day | ORAL | 0 refills | Status: DC

## 2017-07-25 MED ORDER — LEVOFLOXACIN 750 MG PO TABS
750.0000 mg | ORAL_TABLET | Freq: Once | ORAL | Status: AC
  Administered 2017-07-25: 750 mg via ORAL
  Filled 2017-07-25: qty 1

## 2017-07-25 NOTE — ED Notes (Signed)
Taken to CT at this time. 

## 2017-07-25 NOTE — Discharge Instructions (Signed)
Please make an appointment for follow-up with your doctor.  Return to the ER if your symptoms worsen.

## 2017-07-29 DIAGNOSIS — J90 Pleural effusion, not elsewhere classified: Secondary | ICD-10-CM | POA: Diagnosis not present

## 2017-07-29 DIAGNOSIS — J189 Pneumonia, unspecified organism: Secondary | ICD-10-CM | POA: Diagnosis not present

## 2017-08-11 DIAGNOSIS — J90 Pleural effusion, not elsewhere classified: Secondary | ICD-10-CM | POA: Diagnosis not present

## 2017-08-11 DIAGNOSIS — J189 Pneumonia, unspecified organism: Secondary | ICD-10-CM | POA: Diagnosis not present

## 2017-09-02 DIAGNOSIS — Z23 Encounter for immunization: Secondary | ICD-10-CM | POA: Diagnosis not present

## 2017-09-03 DIAGNOSIS — D225 Melanocytic nevi of trunk: Secondary | ICD-10-CM | POA: Diagnosis not present

## 2017-09-03 DIAGNOSIS — L298 Other pruritus: Secondary | ICD-10-CM | POA: Diagnosis not present

## 2017-09-03 DIAGNOSIS — L814 Other melanin hyperpigmentation: Secondary | ICD-10-CM | POA: Diagnosis not present

## 2017-09-03 DIAGNOSIS — L821 Other seborrheic keratosis: Secondary | ICD-10-CM | POA: Diagnosis not present

## 2017-09-03 DIAGNOSIS — L738 Other specified follicular disorders: Secondary | ICD-10-CM | POA: Diagnosis not present

## 2017-09-03 DIAGNOSIS — B078 Other viral warts: Secondary | ICD-10-CM | POA: Diagnosis not present

## 2017-09-06 DIAGNOSIS — G43719 Chronic migraine without aura, intractable, without status migrainosus: Secondary | ICD-10-CM | POA: Diagnosis not present

## 2017-09-09 DIAGNOSIS — J189 Pneumonia, unspecified organism: Secondary | ICD-10-CM | POA: Diagnosis not present

## 2017-11-30 DIAGNOSIS — M4840XA Fatigue fracture of vertebra, site unspecified, initial encounter for fracture: Secondary | ICD-10-CM

## 2017-11-30 HISTORY — DX: Fatigue fracture of vertebra, site unspecified, initial encounter for fracture: M48.40XA

## 2017-12-16 DIAGNOSIS — E781 Pure hyperglyceridemia: Secondary | ICD-10-CM | POA: Diagnosis not present

## 2017-12-23 DIAGNOSIS — R030 Elevated blood-pressure reading, without diagnosis of hypertension: Secondary | ICD-10-CM | POA: Diagnosis not present

## 2017-12-23 DIAGNOSIS — E781 Pure hyperglyceridemia: Secondary | ICD-10-CM | POA: Diagnosis not present

## 2017-12-23 DIAGNOSIS — R82998 Other abnormal findings in urine: Secondary | ICD-10-CM | POA: Diagnosis not present

## 2017-12-23 DIAGNOSIS — I700 Atherosclerosis of aorta: Secondary | ICD-10-CM | POA: Diagnosis not present

## 2017-12-23 DIAGNOSIS — M25519 Pain in unspecified shoulder: Secondary | ICD-10-CM | POA: Diagnosis not present

## 2017-12-23 DIAGNOSIS — H3530 Unspecified macular degeneration: Secondary | ICD-10-CM | POA: Diagnosis not present

## 2017-12-23 DIAGNOSIS — R51 Headache: Secondary | ICD-10-CM | POA: Diagnosis not present

## 2017-12-23 DIAGNOSIS — Z1389 Encounter for screening for other disorder: Secondary | ICD-10-CM | POA: Diagnosis not present

## 2017-12-23 DIAGNOSIS — K219 Gastro-esophageal reflux disease without esophagitis: Secondary | ICD-10-CM | POA: Diagnosis not present

## 2017-12-23 DIAGNOSIS — Z0000 Encounter for general adult medical examination without abnormal findings: Secondary | ICD-10-CM | POA: Diagnosis not present

## 2017-12-28 DIAGNOSIS — Z1212 Encounter for screening for malignant neoplasm of rectum: Secondary | ICD-10-CM | POA: Diagnosis not present

## 2018-01-31 ENCOUNTER — Other Ambulatory Visit: Payer: Self-pay | Admitting: Internal Medicine

## 2018-01-31 DIAGNOSIS — Z1231 Encounter for screening mammogram for malignant neoplasm of breast: Secondary | ICD-10-CM

## 2018-02-28 DIAGNOSIS — G43719 Chronic migraine without aura, intractable, without status migrainosus: Secondary | ICD-10-CM | POA: Diagnosis not present

## 2018-03-14 DIAGNOSIS — Z961 Presence of intraocular lens: Secondary | ICD-10-CM | POA: Diagnosis not present

## 2018-03-14 DIAGNOSIS — H35363 Drusen (degenerative) of macula, bilateral: Secondary | ICD-10-CM | POA: Diagnosis not present

## 2018-03-14 DIAGNOSIS — H43813 Vitreous degeneration, bilateral: Secondary | ICD-10-CM | POA: Diagnosis not present

## 2018-03-14 DIAGNOSIS — H353134 Nonexudative age-related macular degeneration, bilateral, advanced atrophic with subfoveal involvement: Secondary | ICD-10-CM | POA: Diagnosis not present

## 2018-03-14 DIAGNOSIS — H43811 Vitreous degeneration, right eye: Secondary | ICD-10-CM | POA: Diagnosis not present

## 2018-03-15 ENCOUNTER — Ambulatory Visit
Admission: RE | Admit: 2018-03-15 | Discharge: 2018-03-15 | Disposition: A | Discharge status: Home / Self Care | Payer: Medicare HMO | Source: Ambulatory Visit | Attending: Internal Medicine | Admitting: Internal Medicine

## 2018-03-15 DIAGNOSIS — Z1231 Encounter for screening mammogram for malignant neoplasm of breast: Secondary | ICD-10-CM | POA: Diagnosis not present

## 2018-05-03 DIAGNOSIS — R0789 Other chest pain: Secondary | ICD-10-CM | POA: Diagnosis not present

## 2018-05-03 DIAGNOSIS — R0600 Dyspnea, unspecified: Secondary | ICD-10-CM | POA: Diagnosis not present

## 2018-05-03 DIAGNOSIS — R5383 Other fatigue: Secondary | ICD-10-CM | POA: Diagnosis not present

## 2018-05-03 DIAGNOSIS — R030 Elevated blood-pressure reading, without diagnosis of hypertension: Secondary | ICD-10-CM | POA: Diagnosis not present

## 2018-05-03 DIAGNOSIS — R918 Other nonspecific abnormal finding of lung field: Secondary | ICD-10-CM | POA: Diagnosis not present

## 2018-05-04 ENCOUNTER — Other Ambulatory Visit (HOSPITAL_COMMUNITY): Payer: Self-pay | Admitting: Respiratory Therapy

## 2018-05-04 DIAGNOSIS — R0600 Dyspnea, unspecified: Secondary | ICD-10-CM

## 2018-05-07 ENCOUNTER — Other Ambulatory Visit: Payer: Self-pay | Admitting: Internal Medicine

## 2018-05-07 DIAGNOSIS — R9389 Abnormal findings on diagnostic imaging of other specified body structures: Secondary | ICD-10-CM

## 2018-05-07 DIAGNOSIS — R0789 Other chest pain: Secondary | ICD-10-CM

## 2018-05-07 DIAGNOSIS — R5383 Other fatigue: Secondary | ICD-10-CM

## 2018-05-07 DIAGNOSIS — R0600 Dyspnea, unspecified: Secondary | ICD-10-CM

## 2018-05-13 ENCOUNTER — Ambulatory Visit (HOSPITAL_COMMUNITY)
Admission: RE | Admit: 2018-05-13 | Discharge: 2018-05-13 | Disposition: A | Discharge status: Home / Self Care | Payer: Medicare HMO | Source: Ambulatory Visit | Attending: Internal Medicine | Admitting: Internal Medicine

## 2018-05-13 DIAGNOSIS — R0600 Dyspnea, unspecified: Secondary | ICD-10-CM

## 2018-05-13 LAB — PULMONARY FUNCTION TEST
DL/VA % pred: 87 %
DL/VA: 4.21 ml/min/mmHg/L
DLCO unc % pred: 60 %
DLCO unc: 14.62 ml/min/mmHg
FEF 25-75 Pre: 1.39 L/sec
FEF2575-%Pred-Pre: 99 %
FEV1-%Pred-Pre: 89 %
FEV1-Pre: 1.73 L
FEV1FVC-%Pred-Pre: 102 %
FEV6-%Pred-Pre: 92 %
FEV6-Pre: 2.27 L
FEV6FVC-%Pred-Pre: 104 %
FVC-%Pred-Pre: 88 %
FVC-Pre: 2.29 L
Pre FEV1/FVC ratio: 76 %
Pre FEV6/FVC Ratio: 99 %
RV % pred: 73 %
RV: 1.75 L
TLC % pred: 80 %
TLC: 4.08 L

## 2018-05-19 ENCOUNTER — Ambulatory Visit
Admission: RE | Admit: 2018-05-19 | Discharge: 2018-05-19 | Disposition: A | Discharge status: Home / Self Care | Payer: Medicare HMO | Source: Ambulatory Visit | Attending: Internal Medicine | Admitting: Internal Medicine

## 2018-05-19 DIAGNOSIS — R0600 Dyspnea, unspecified: Secondary | ICD-10-CM

## 2018-05-19 DIAGNOSIS — R5383 Other fatigue: Secondary | ICD-10-CM

## 2018-05-19 DIAGNOSIS — R9389 Abnormal findings on diagnostic imaging of other specified body structures: Secondary | ICD-10-CM

## 2018-05-19 DIAGNOSIS — R0789 Other chest pain: Secondary | ICD-10-CM

## 2018-05-19 DIAGNOSIS — J189 Pneumonia, unspecified organism: Secondary | ICD-10-CM | POA: Diagnosis not present

## 2018-06-15 DIAGNOSIS — D100 Benign neoplasm of lip: Secondary | ICD-10-CM | POA: Diagnosis not present

## 2018-08-24 DIAGNOSIS — B070 Plantar wart: Secondary | ICD-10-CM | POA: Diagnosis not present

## 2018-08-24 DIAGNOSIS — L720 Epidermal cyst: Secondary | ICD-10-CM | POA: Diagnosis not present

## 2018-08-24 DIAGNOSIS — L298 Other pruritus: Secondary | ICD-10-CM | POA: Diagnosis not present

## 2018-08-24 DIAGNOSIS — L281 Prurigo nodularis: Secondary | ICD-10-CM | POA: Diagnosis not present

## 2018-08-24 DIAGNOSIS — D225 Melanocytic nevi of trunk: Secondary | ICD-10-CM | POA: Diagnosis not present

## 2018-08-24 DIAGNOSIS — L821 Other seborrheic keratosis: Secondary | ICD-10-CM | POA: Diagnosis not present

## 2018-08-24 DIAGNOSIS — B078 Other viral warts: Secondary | ICD-10-CM | POA: Diagnosis not present

## 2018-08-24 DIAGNOSIS — D3617 Benign neoplasm of peripheral nerves and autonomic nervous system of trunk, unspecified: Secondary | ICD-10-CM | POA: Diagnosis not present

## 2018-08-24 DIAGNOSIS — D485 Neoplasm of uncertain behavior of skin: Secondary | ICD-10-CM | POA: Diagnosis not present

## 2018-08-24 DIAGNOSIS — L570 Actinic keratosis: Secondary | ICD-10-CM | POA: Diagnosis not present

## 2018-08-24 DIAGNOSIS — L814 Other melanin hyperpigmentation: Secondary | ICD-10-CM | POA: Diagnosis not present

## 2018-08-27 DIAGNOSIS — Z23 Encounter for immunization: Secondary | ICD-10-CM | POA: Diagnosis not present

## 2018-09-06 DIAGNOSIS — G43719 Chronic migraine without aura, intractable, without status migrainosus: Secondary | ICD-10-CM | POA: Diagnosis not present

## 2018-09-21 ENCOUNTER — Other Ambulatory Visit: Payer: Self-pay | Admitting: Internal Medicine

## 2018-09-26 ENCOUNTER — Other Ambulatory Visit: Payer: Self-pay

## 2018-09-26 ENCOUNTER — Ambulatory Visit: Payer: Medicare HMO | Admitting: Physician Assistant

## 2018-09-26 ENCOUNTER — Encounter: Payer: Self-pay | Admitting: Physician Assistant

## 2018-09-26 ENCOUNTER — Other Ambulatory Visit (INDEPENDENT_AMBULATORY_CARE_PROVIDER_SITE_OTHER): Payer: Medicare HMO

## 2018-09-26 VITALS — BP 128/78 | HR 72 | Ht 64.0 in | Wt 139.0 lb

## 2018-09-26 DIAGNOSIS — R350 Frequency of micturition: Secondary | ICD-10-CM

## 2018-09-26 DIAGNOSIS — K219 Gastro-esophageal reflux disease without esophagitis: Secondary | ICD-10-CM

## 2018-09-26 DIAGNOSIS — M549 Dorsalgia, unspecified: Secondary | ICD-10-CM

## 2018-09-26 DIAGNOSIS — R109 Unspecified abdominal pain: Secondary | ICD-10-CM

## 2018-09-26 LAB — BASIC METABOLIC PANEL
BUN: 11 mg/dL (ref 6–23)
CO2: 29 mEq/L (ref 19–32)
Calcium: 9.7 mg/dL (ref 8.4–10.5)
Chloride: 98 mEq/L (ref 96–112)
Creatinine, Ser: 0.82 mg/dL (ref 0.40–1.20)
GFR: 71.2 mL/min (ref >60.00)
Glucose, Bld: 87 mg/dL (ref 70–99)
Potassium: 4.5 mEq/L (ref 3.5–5.1)
Sodium: 137 mEq/L (ref 135–145)

## 2018-09-26 LAB — URINALYSIS, ROUTINE W REFLEX MICROSCOPIC
Bilirubin Urine: NEGATIVE
Hgb urine dipstick: NEGATIVE
Ketones, ur: NEGATIVE
Nitrite: POSITIVE — AB
Specific Gravity, Urine: <=1.005 — AB (ref 1.000–1.030)
Total Protein, Urine: NEGATIVE
Urine Glucose: NEGATIVE
Urobilinogen, UA: 0.2 (ref 0.0–1.0)
pH: 6 (ref 5.0–8.0)

## 2018-09-26 LAB — CBC WITH DIFFERENTIAL/PLATELET
Basophils Absolute: 0.1 10*3/uL (ref 0.0–0.1)
Basophils Relative: 1.2 % (ref 0.0–3.0)
Eosinophils Absolute: 0.2 10*3/uL (ref 0.0–0.7)
Eosinophils Relative: 3.1 % (ref 0.0–5.0)
HCT: 42 % (ref 36.0–46.0)
Hemoglobin: 14.2 g/dL (ref 12.0–15.0)
Lymphocytes Relative: 20.1 % (ref 12.0–46.0)
Lymphs Abs: 1.5 10*3/uL (ref 0.7–4.0)
MCHC: 33.9 g/dL (ref 30.0–36.0)
MCV: 83.4 fl (ref 78.0–100.0)
Monocytes Absolute: 0.9 10*3/uL (ref 0.1–1.0)
Monocytes Relative: 12.5 % — ABNORMAL HIGH (ref 3.0–12.0)
Neutro Abs: 4.8 10*3/uL (ref 1.4–7.7)
Neutrophils Relative %: 63.1 % (ref 43.0–77.0)
Platelets: 262 10*3/uL (ref 150.0–400.0)
RBC: 5.03 Mil/uL (ref 3.87–5.11)
RDW: 14.3 % (ref 11.5–15.5)
WBC: 7.6 10*3/uL (ref 4.0–10.5)

## 2018-09-26 MED ORDER — CIPROFLOXACIN HCL 500 MG PO TABS: 500.0000 mg | ORAL_TABLET | Freq: Two times a day (BID) | ORAL | 0 refills | Status: AC

## 2018-09-26 MED ORDER — OMEPRAZOLE 20 MG PO CPDR: DELAYED_RELEASE_CAPSULE | ORAL | 3 refills | Status: DC

## 2018-09-26 NOTE — Progress Notes (Signed)
Subjective:    Patient ID: [name] [name], female    DOB: Dec 09, 1935, 82 y.o.   MRN: [id number]  HPI [name] [name] is a pleasant 82 year old white female, known to Dr. Scarlette Shorts who comes in today for routine follow-up and medication refills.    She is followed for chronic GERD and has history of esophageal stricture which has required dilation x2 in the past.  Last EGD in 2017 showed a 15 mm benign distal esophageal stricture which was Venia Minks dilated to 75 [place]. Last colonoscopy April 2015 with severe diverticulosis, otherwise normal. She has done well on Prilosec 20 mg p.o. every morning.  Today she says she does fine as long as she remembers to take her medication but if she misses a day or 2 her symptoms will quickly return.  She rarely has to take a second dose for rate through symptoms. She says she has some difficulty swallowing larger pills, and will have trouble with white meat chicken and steak if she does not cut it up small enough.  Her husband says she occasionally has difficulty with rice as well.  She does not have any regurgitation, he says it just feels like meat is slow to go down at times.  She will avoid chicken and beef most of the time.  She does not feel that she needs another EGD. Her other main concern today is onset of left-sided back and flank pain about 3 days ago.  She says she was sitting playing bingo, on a hard chair for a couple of hours and when she got up she felt pain in the left flank area and she is not aware of any injury twisting etc.  The pain is actually been worse over the past couple of days, and she is been having some difficulty rolling over in bed and going from lying to sitting.  Her appetite is been okay, she feels a bit fatigued today She is also noticed some increased urinary frequency but denies any dysuria.  No fever or chills. She is also been constipated over the past 3 to 4 days which is unusual for her.  She started taking a new supplement that  contains a sulfa product and calcium last week.  Apparently is not supposed to cause constipation and is a detox sort of operation.  Review of Systems Pertinent positive and negative review of systems were noted in the above HPI section.  All other review of systems was otherwise negative.  Outpatient Encounter Medications as of 09/26/2018  Medication Sig  . Calcium Carb-Cholecalciferol (CALCIUM 1000 + D PO) Take 3 g by mouth daily.  Marland Kitchen gabapentin (NEURONTIN) 300 MG capsule Take 1 capsule by mouth at bedtime.  . Methylsulfonylmethane (MSM) 1000 MG CAPS Take 1 capsule by mouth 3 (three) times daily.  . Multiple Vitamins-Minerals (CENTRUM SILVER ADULT 50+) TABS Take 1 tablet by mouth every morning. Reported on 12/12/2015  . omeprazole (PRILOSEC) 20 MG capsule Take 1 tablet by mouth every morning.  . [DISCONTINUED] omeprazole (PRILOSEC) 20 MG capsule Take 1 capsule (20 mg total) by mouth 2 (two) times daily. (Patient taking differently: Take 20 mg by mouth daily. )  . [DISCONTINUED] Biotin 1 MG CAPS Take 1 capsule by mouth every morning. Reported on 12/12/2015  . [DISCONTINUED] levofloxacin (LEVAQUIN) 750 MG tablet Take 1 tablet (750 mg total) by mouth daily.   No facility-administered encounter medications on file as of 09/26/2018.    Allergies  Allergen Reactions  . Isometheptene-Dichloral-Apap Nausea Only    (  midrin)  . Oxycodone Hcl Nausea And Vomiting    Pt reports tolerating vicodin   Patient Active Problem List   Diagnosis Date Noted  . Postoperative state 02/13/2016  . Chest pain with moderate risk of acute coronary syndrome 10/08/2014  . Sepsis syndrome 06/26/2013  . Acute pyelonephritis 06/26/2013  . Migraine 03/28/2013  . Rectocele 02/13/2013  . Sleep apnea 02/16/2012  . Chronic daily headache 01/12/2012  . OSTEOPENIA 10/17/2008  . ESOPHAGEAL STRICTURE 05/02/2008  . DIVERTICULOSIS-COLON 05/02/2008  . DIVERTICULITIS-COLON 05/02/2008  . ABDOMINAL PAIN-EPIGASTRIC 05/02/2008    . GASTRITIS, ACUTE 03/27/2008  . LEG CRAMPS, NOCTURNAL 02/10/2008  . Headache(784.0) 02/10/2008  . OTHER DYSPHAGIA 02/10/2008  . HYPERLIPIDEMIA 02/06/2008  . LACUNAR INFARCTION 02/06/2008  . COLONIC POLYPS 10/13/2007  . Migraine without aura 09/27/2007  . Esophageal reflux 09/27/2007  . HERNIATED DISC 09/27/2007   Social History   Socioeconomic History  . Marital status: Married    Spouse name: Not on file  . Number of children: Not on file  . Years of education: Not on file  . Highest education level: Not on file  Occupational History  . Occupation: retired    Comment: now volunteers at hospital  Social Needs  . Financial resource strain: Not on file  . Food insecurity:    Worry: Not on file    Inability: Not on file  . Transportation needs:    Medical: Not on file    Non-medical: Not on file  Tobacco Use  . Smoking status: Former Smoker    Years: 10.00    Types: Cigarettes  . Smokeless tobacco: Never Used  Substance and Sexual Activity  . Alcohol use: No  . Drug use: No  . Sexual activity: Yes    Partners: Male  Lifestyle  . Physical activity:    Days per week: Not on file    Minutes per session: Not on file  . Stress: Not on file  Relationships  . Social connections:    Talks on phone: Not on file    Gets together: Not on file    Attends religious service: Not on file    Active member of club or organization: Not on file    Attends meetings of clubs or organizations: Not on file    Relationship status: Not on file  . Intimate partner violence:    Fear of current or ex partner: Not on file    Emotionally abused: Not on file    Physically abused: Not on file    Forced sexual activity: Not on file  Other Topics Concern  . Not on file  Social History Narrative  . Not on file    [name] [name]'s family history includes Aneurysm (age of onset: 64) in her sister; Breast cancer in her maternal aunt and paternal aunt; Cancer in her brother; Colon cancer in her  maternal aunt.      Objective:    Vitals:   09/26/18 1011  BP: 128/78  Pulse: 72    Physical Exam; well-developed elderly white female in no acute distress, accompanied by her husband both pleasant, BMI 23.8.  HEENT; nontraumatic normocephalic EOMI PERRLA sclera anicteric oral mucosa moist, Cardiovascular; regular rate and rhythm with S1-S2 no murmur rub or gallop, Pulmonary; clear bilaterally, Abdomen; soft, nontender nondistended bowel sounds are active there is no palpable mass or hepatosplenomegaly.  Back; he has some tenderness along the left mid back and left flank, no CVA tenderness, no rash or lesions.  Rectal; exam not  done, Extremities; no clubbing cyanosis or edema skin warm and dry,  Neuro psych; alert and oriented, grossly nonfocal mood and affect appropriate       Assessment & Plan:   #48 82 year old white female with chronic GERD-well-controlled on omeprazole 20 mg p.o. every morning #2 occasional solid food dysphasia to chicken and beef.  Patient has prior history of esophageal stricture status post dilation x2. We discussed possible repeat EGD but she does not think her symptoms are significant enough to warrant repeat EGD at this time.  #3 diverticulosis #4.  History of adenomatous colon polyps-negative colonoscopy April 2015 no further surveillance due to age #63.  History of CVA   #6 status post cholecystectomy  #7 4-day history of left back/left flank pain.  Etiology is not clear, I suspect this may be musculoskeletal, will rule out UTI #8 constipation-acute   Plan; refill omeprazole 20 mg p.o. every morning x1 year Patient will monitor her mild dysphasia symptoms and is advised to call should she have any progression of symptoms. Start MiraLAX 17 g in 8 ounces of water daily over the next few days for acute constipation, then use as needed Check CBC and UA with reflex culture today. Patient can use Aleve as needed for back/flank discomfort and is advised to make  an appointment with Dr. Brigitte Pulse her PCP later this week if symptoms are persisting.   [name] Genia Harold PA-C 09/26/2018   Cc: Marton Redwood, MD

## 2018-09-26 NOTE — Patient Instructions (Addendum)
Your provider has requested that you go to the basement level for lab work before leaving today. Press "B" on the elevator. The lab is located at the first door on the left as you exit the elevator. We sent refills for the Omprazole 20 mg to  [facility].  Start Miralax 17 grams in 8 oz of water.   See Dr. Brigitte Pulse later this week if the back pain persists.   Normal BMI (Body Mass Index- based on height and weight) is between 23 and 30. Your BMI today is Body mass index is 23.86 kg/m. Marland Kitchen Please consider follow up  regarding your BMI with your Primary Care Provider.

## 2018-09-27 NOTE — Progress Notes (Signed)
Assessment and plan reviewed 

## 2018-09-28 DIAGNOSIS — R109 Unspecified abdominal pain: Secondary | ICD-10-CM | POA: Diagnosis not present

## 2018-09-28 DIAGNOSIS — K5900 Constipation, unspecified: Secondary | ICD-10-CM | POA: Diagnosis not present

## 2018-09-28 DIAGNOSIS — Z6824 Body mass index (BMI) 24.0-24.9, adult: Secondary | ICD-10-CM | POA: Diagnosis not present

## 2018-09-28 DIAGNOSIS — R030 Elevated blood-pressure reading, without diagnosis of hypertension: Secondary | ICD-10-CM | POA: Diagnosis not present

## 2018-09-29 ENCOUNTER — Ambulatory Visit
Admission: RE | Admit: 2018-09-29 | Discharge: 2018-09-29 | Disposition: A | Discharge status: Home / Self Care | Payer: Medicare HMO | Source: Ambulatory Visit | Attending: Internal Medicine | Admitting: Internal Medicine

## 2018-09-29 ENCOUNTER — Other Ambulatory Visit: Payer: Self-pay | Admitting: Internal Medicine

## 2018-09-29 DIAGNOSIS — N390 Urinary tract infection, site not specified: Secondary | ICD-10-CM | POA: Diagnosis not present

## 2018-09-29 DIAGNOSIS — K5900 Constipation, unspecified: Secondary | ICD-10-CM

## 2018-09-29 DIAGNOSIS — K4091 Unilateral inguinal hernia, without obstruction or gangrene, recurrent: Secondary | ICD-10-CM | POA: Diagnosis not present

## 2018-09-29 DIAGNOSIS — K5909 Other constipation: Secondary | ICD-10-CM | POA: Diagnosis not present

## 2018-09-29 DIAGNOSIS — K449 Diaphragmatic hernia without obstruction or gangrene: Secondary | ICD-10-CM | POA: Diagnosis not present

## 2018-09-29 DIAGNOSIS — Z6824 Body mass index (BMI) 24.0-24.9, adult: Secondary | ICD-10-CM | POA: Diagnosis not present

## 2018-09-29 DIAGNOSIS — K5730 Diverticulosis of large intestine without perforation or abscess without bleeding: Secondary | ICD-10-CM | POA: Diagnosis not present

## 2018-09-29 DIAGNOSIS — R1084 Generalized abdominal pain: Secondary | ICD-10-CM | POA: Diagnosis not present

## 2018-09-29 MED ORDER — IOPAMIDOL (ISOVUE-300) INJECTION 61%
100.0000 mL | Freq: Once | INTRAVENOUS | Status: AC | PRN
  Administered 2018-09-29: 100 mL via INTRAVENOUS

## 2018-10-03 ENCOUNTER — Encounter (HOSPITAL_COMMUNITY): Payer: Self-pay | Admitting: Emergency Medicine

## 2018-10-03 ENCOUNTER — Emergency Department (HOSPITAL_COMMUNITY)
Admission: EM | Admit: 2018-10-03 | Discharge: 2018-10-03 | Disposition: A | Discharge status: Home / Self Care | Payer: Medicare HMO | Attending: Emergency Medicine | Admitting: Emergency Medicine

## 2018-10-03 DIAGNOSIS — Z79899 Other long term (current) drug therapy: Secondary | ICD-10-CM | POA: Insufficient documentation

## 2018-10-03 DIAGNOSIS — Y9389 Activity, other specified: Secondary | ICD-10-CM | POA: Insufficient documentation

## 2018-10-03 DIAGNOSIS — Y998 Other external cause status: Secondary | ICD-10-CM | POA: Diagnosis not present

## 2018-10-03 DIAGNOSIS — X58XXXA Exposure to other specified factors, initial encounter: Secondary | ICD-10-CM | POA: Diagnosis not present

## 2018-10-03 DIAGNOSIS — Z87891 Personal history of nicotine dependence: Secondary | ICD-10-CM | POA: Diagnosis not present

## 2018-10-03 DIAGNOSIS — K5900 Constipation, unspecified: Secondary | ICD-10-CM | POA: Diagnosis not present

## 2018-10-03 DIAGNOSIS — Y9289 Other specified places as the place of occurrence of the external cause: Secondary | ICD-10-CM | POA: Diagnosis not present

## 2018-10-03 DIAGNOSIS — S22080A Wedge compression fracture of T11-T12 vertebra, initial encounter for closed fracture: Secondary | ICD-10-CM | POA: Diagnosis not present

## 2018-10-03 DIAGNOSIS — S22089A Unspecified fracture of T11-T12 vertebra, initial encounter for closed fracture: Secondary | ICD-10-CM | POA: Diagnosis not present

## 2018-10-03 DIAGNOSIS — E785 Hyperlipidemia, unspecified: Secondary | ICD-10-CM | POA: Diagnosis not present

## 2018-10-03 DIAGNOSIS — M549 Dorsalgia, unspecified: Secondary | ICD-10-CM | POA: Diagnosis present

## 2018-10-03 MED ORDER — BISACODYL 5 MG PO TBEC: 10.0000 mg | DELAYED_RELEASE_TABLET | Freq: Every day | ORAL | 0 refills | Status: DC | PRN

## 2018-10-03 MED ORDER — POLYETHYLENE GLYCOL 3350 17 G PO PACK
34.0000 g | PACK | Freq: Every day | ORAL | Status: DC
  Administered 2018-10-03: 34 g via ORAL
  Filled 2018-10-03 (×2): qty 2

## 2018-10-03 MED ORDER — POLYETHYLENE GLYCOL 3350 17 G PO PACK: 17.0000 g | PACK | Freq: Two times a day (BID) | ORAL | 0 refills | Status: AC | PRN

## 2018-10-03 MED ORDER — HYDROCODONE-ACETAMINOPHEN 5-325 MG PO TABS
2.0000 | ORAL_TABLET | Freq: Once | ORAL | Status: AC
  Administered 2018-10-03: 2 via ORAL
  Filled 2018-10-03: qty 2

## 2018-10-03 MED ORDER — BISACODYL 5 MG PO TBEC
10.0000 mg | DELAYED_RELEASE_TABLET | Freq: Once | ORAL | Status: AC
  Administered 2018-10-03: 10 mg via ORAL
  Filled 2018-10-03: qty 2

## 2018-10-03 MED ORDER — TRAMADOL HCL 50 MG PO TABS: 50.0000 mg | ORAL_TABLET | Freq: Three times a day (TID) | ORAL | 0 refills | Status: DC | PRN

## 2018-10-03 MED ORDER — LORAZEPAM 1 MG PO TABS
1.0000 mg | ORAL_TABLET | Freq: Once | ORAL | Status: AC
  Administered 2018-10-03: 1 mg via ORAL
  Filled 2018-10-03: qty 1

## 2018-10-03 MED ORDER — IBUPROFEN 200 MG PO TABS
400.0000 mg | ORAL_TABLET | Freq: Once | ORAL | Status: AC
  Administered 2018-10-03: 400 mg via ORAL
  Filled 2018-10-03: qty 2

## 2018-10-03 NOTE — ED Triage Notes (Signed)
Pt had Ct scan of her back last Thursday and had collapse T12. Pt c/o back pains for week. Denies and falls or injuries

## 2018-10-03 NOTE — ED Notes (Addendum)
Bio tech was called to adjust back brace on pt d/t pt is ready to leave.  Dialed [phone].  Awaiting return call.

## 2018-10-03 NOTE — Progress Notes (Addendum)
CSW met with family at the request of the ED CN since RN CM consult was placed for a morning RN CM response to see if anything in addition is needed.   know what options are available to pt at this time.  CSW met with EDP who stated next step was to D/C pt home once pt/pt's family were ready.  CSW met with pt/pt's family at bedside and pt was friendly but not fully A&OX4, due to pain meds, per pt's family.  Pt's spouse and pt's daughter were bedside and wanted to know options available at times like this.  Per pt's family pt's son is a doctor (possibly Chief Financial Officer, per EPD?)  CSW counseled pt's daughter on SNF's, ALF's, how they work, who is qualified to go there, insurances that do and don't pay for them and how they are paid for otherwise.  CSW counseled family on [facility] agencies, what they do and don't provide and how they will be contacted by the RN CM on 11/5 to pick the company of their choice for [facility] services.  Pt's spouse stated he had utilized [facility] agencies in the past due to knee surgeries and was familiar with them.  CSW provided pt's spouse/daughter with a list of Skilled Nursing Facilities in the [place] area and counseled pt's family on how access can be gained from either the hospital or through the PCP, at times with the assistance of a [place], as well.  CSW counseled pt's spouse/daughter on contacting the facilities of their choice should the pt/pt's spouse/daughter should ever feel they were unable to care for the pt at home so they can seek admission into a SNF using the pt's recently acute compression fracture of the pt's vertebre, and attempt placement through the PCP using the required FL-2. CSW counseled that using the provided list the family can visit/review SNF's to determine their preferred SNF if they think it necessary.  Pt's spouse/daughter was appreciative and thanked the CSW.  CSW provided work cell should pt's spouse have any more questions and concerns.   Pt and pt's spouse and daughter were appreciative and thanked the CSW and stated the plan was to return home and care for the pt.  Please reconsult if future social work needs arise.  CSW signing off, as social work intervention is no longer needed.  [name] [name]. Juhi Lagrange, LCSW, LCAS, CSI Clinical Social Worker Ph: [phone]

## 2018-10-03 NOTE — ED Provider Notes (Signed)
[facility] DEPT Provider Note   CSN: [id number] Arrival date & time: 10/03/18  1320     History   Chief Complaint Chief Complaint  Patient presents with  . Back Pain  . Constipation    HPI [name] [name] is a 82 y.o. female.  HPI   82yF with back pain. Onset a little over a week ago. No trauma. Pain has been persistent since. Severe when she sits up and with ambulation. Had CT on 10/31 which showed t12 compression fx. She has also been dealing with constipation. She took an enema on Friday and had a BM and she felt better when she did. She has not had another bowel movement since then. She has tried taking miralax subsequently. She has not been taking anything for her back pain.   Past Medical History:  Diagnosis Date  . Acid reflux disease   . Anemia    history of  . Arthritis    both hands  . Blood transfusion without reported diagnosis   . Cataract    bilateral cateracts removed  . Common migraine   . Diverticulosis    patient denies  . Esophageal stricture   . Herniated disc   . Hiatal hernia 2009   EGD-Small   . History of colonic polyps   . Hyperlipidemia   . Lacunar infarction ([place])   . Macular degeneration   . Sleep apnea    has c-pap but does not wear    Patient Active Problem List   Diagnosis Date Noted  . Postoperative state 02/13/2016  . Chest pain with moderate risk of acute coronary syndrome 10/08/2014  . Sepsis syndrome 06/26/2013  . Acute pyelonephritis 06/26/2013  . Migraine 03/28/2013  . Rectocele 02/13/2013  . Sleep apnea 02/16/2012  . Chronic daily headache 01/12/2012  . OSTEOPENIA 10/17/2008  . ESOPHAGEAL STRICTURE 05/02/2008  . DIVERTICULOSIS-COLON 05/02/2008  . DIVERTICULITIS-COLON 05/02/2008  . ABDOMINAL PAIN-EPIGASTRIC 05/02/2008  . GASTRITIS, ACUTE 03/27/2008  . LEG CRAMPS, NOCTURNAL 02/10/2008  . Headache(784.0) 02/10/2008  . OTHER DYSPHAGIA 02/10/2008  . HYPERLIPIDEMIA 02/06/2008  .  LACUNAR INFARCTION 02/06/2008  . COLONIC POLYPS 10/13/2007  . Migraine without aura 09/27/2007  . Esophageal reflux 09/27/2007  . HERNIATED DISC 09/27/2007    Past Surgical History:  Procedure Laterality Date  . CHOLECYSTECTOMY     4-5 YRS AGO.  Marland Kitchen COLONOSCOPY    . ESOPHAGEAL DILATION    . EYE SURGERY     BILATERAL CATARAT  . INGUINAL HERNIA REPAIR     rt.  Marland Kitchen RECTOCELE REPAIR N/A 02/13/2013   Procedure: POSTERIOR REPAIR (RECTOCELE);  Surgeon: Emily Filbert, MD;  Location: [facility];  Service: Gynecology;  Laterality: N/A;  . ROBOTIC ASSISTED LAPAROSCOPIC LYSIS OF ADHESION  02/13/2016   Procedure: ROBOTIC ASSISTED LAPAROSCOPIC LYSIS OF ADHESION;  Surgeon: Princess Bruins, MD;  Location: [facility];  Service: Gynecology;;  . ROBOTIC ASSISTED LAPAROSCOPIC SACROCOLPOPEXY N/A 02/13/2016   Procedure: ROBOTIC ASSISTED LAPAROSCOPIC SACROCOLPOPEXY With Theola Sequin;  Surgeon: Princess Bruins, MD;  Location: [facility];  Service: Gynecology;  Laterality: N/A;  . TOTAL ABDOMINAL HYSTERECTOMY    . UPPER GASTROINTESTINAL ENDOSCOPY       OB History    Gravida  3   Para  3   Term      Preterm      AB      Living  3     SAB      TAB      Ectopic  Multiple      Live Births               Home Medications    Prior to Admission medications   Medication Sig Start Date End Date Taking? Authorizing Provider  Calcium Carb-Cholecalciferol (CALCIUM 1000 + D PO) Take 3 g by mouth daily.    [provider]  ciprofloxacin (CIPRO) 500 MG tablet Take 1 tablet (500 mg total) by mouth 2 (two) times daily for 7 days. 09/26/18 10/03/18  Esterwood, Amy S, PA-C  gabapentin (NEURONTIN) 300 MG capsule Take 1 capsule by mouth at bedtime. 11/20/15   [provider]  Methylsulfonylmethane (MSM) 1000 MG CAPS Take 1 capsule by mouth 3 (three) times daily.    [provider]  Multiple Vitamins-Minerals (CENTRUM SILVER ADULT 50+) TABS Take 1 tablet by mouth every morning. Reported  on 12/12/2015    [provider]  omeprazole (PRILOSEC) 20 MG capsule Take 1 tablet by mouth every morning. 09/26/18   Esterwood, Amy S, PA-C    Family History Family History  Problem Relation Age of Onset  . Breast cancer Maternal Aunt   . Colon cancer Maternal Aunt   . Cancer Brother        kidney cancer  . Aneurysm Sister 69       died   . Breast cancer Paternal Aunt   . Esophageal cancer Neg Hx   . Rectal cancer Neg Hx   . Stomach cancer Neg Hx     Social History Social History   Tobacco Use  . Smoking status: Former Smoker    Years: 10.00    Types: Cigarettes  . Smokeless tobacco: Never Used  Substance Use Topics  . Alcohol use: No  . Drug use: No     Allergies   Isometheptene-dichloral-apap and Oxycodone hcl   Review of Systems Review of Systems  All systems reviewed and negative, other than as noted in HPI.  Physical Exam Updated Vital Signs BP (!) 144/85 (BP Location: Left Arm)   Pulse (!) 131   Temp 97.8 F (36.6 C) (Oral)   Resp 17   Ht 5\' 4"  (1.626 m)   Wt 60.8 kg   SpO2 98%   BMI 23.00 kg/m   Physical Exam  Constitutional: She appears well-developed and well-nourished. No distress.  HENT:  Head: Normocephalic and atraumatic.  Eyes: Conjunctivae are normal. Right eye exhibits no discharge. Left eye exhibits no discharge.  Neck: Neck supple.  Cardiovascular: Normal rate, regular rhythm and normal heart sounds. Exam reveals no gallop and no friction rub.  No murmur heard. Pulmonary/Chest: Effort normal and breath sounds normal. No respiratory distress.  Abdominal: Soft. She exhibits no distension. There is no tenderness.  Musculoskeletal: She exhibits tenderness. She exhibits no edema.  TTP lower thoracic/upper lumbar region. No overlying skin changes.   Neurological: She is alert. No cranial nerve deficit. Coordination normal.  Skin: Skin is warm and dry.  Psychiatric: She has a normal mood and affect. Her behavior is normal.  Thought content normal.  Nursing note and vitals reviewed.    ED Treatments / Results  Labs (all labs ordered are listed, but only abnormal results are displayed) Labs Reviewed - No data to display  EKG None  Radiology No results found.  Procedures Procedures (including critical care time)  Medications Ordered in ED Medications - No data to display   Initial Impression / Assessment and Plan / ED Course  I have reviewed the triage vital signs  and the nursing notes.  Pertinent labs & imaging results that were available during my care of the patient were reviewed by me and considered in my medical decision making (see chart for details).     82yF with back pain. T12 compression fx. No trauma, but has hx of osteopenia. At this point she really hasn't been taking anything for pain. She needs a pain regimen. She has tolerated vicodin previously. She has good renal function and she is not anticoagulated. I think judicious use of low dose NSAIDs is also reasonable for a short period of time.   She is also dealing with constipation and pain medication will only exacerbate this. Will give her an enema here in the ED. She can continue them PRN at home. Would continue miralax and also add bisacodyl.   Discussed MRI today versus waiting to see how she responds to more conservative measures first. She/family are electing to defer further imaging at this time which I think is reasonable. Will try to get her a walker and fitted for TLSO brace. After business hours, it can be harder to get a walker to her while in the ED though. I  Discussed with Case Management. Gilford Rile will have to be picked up from [facility] by family tomorrow.   Final Clinical Impressions(s) / ED Diagnoses   Final diagnoses:  Compression fracture of T12 vertebra, initial encounter ([place])  Constipation, unspecified constipation type    ED Discharge Orders    None       Virgel Manifold, MD 10/04/18 1654

## 2018-10-03 NOTE — Discharge Instructions (Signed)
Follow-up with Dr Brigitte Pulse. If you are having persistent severe pain then he can arrange an MRI and follow-up with interventional radiology. Keep taking aleve as needed. Since you had hallucinations with vicodin, I am prescribing something else. My expectation is that they should resolve by the morning. You are fine to continue taking tylenol as needed for pain. These three medications are all safe to take concurrently.   You can continue to do enemas as needed at home. Arrangements for assistive devices and other home health needs were arranged by case management.

## 2018-10-03 NOTE — ED Notes (Signed)
Pt ambulated with assistance of this writer & Abigail Butts, RN

## 2018-10-03 NOTE — ED Notes (Signed)
[facility] back in dept to adjust back brace to properly fit pt.

## 2018-10-03 NOTE — ED Notes (Signed)
Report received from previous RN, soap suds enema given.

## 2018-10-03 NOTE — ED Notes (Signed)
Bed: WA30 Expected date:  Expected time:  Means of arrival:  Comments: 

## 2018-10-03 NOTE — ED Notes (Signed)
Roderic Palau, CSW, in with pt.

## 2018-10-03 NOTE — ED Triage Notes (Signed)
Pt adds that she had constipation and used fleet's enema on Thursday which worked but had no Bm since. Used three doses of Miralax.

## 2018-10-04 DIAGNOSIS — Z9889 Other specified postprocedural states: Secondary | ICD-10-CM | POA: Diagnosis not present

## 2018-10-04 DIAGNOSIS — R079 Chest pain, unspecified: Secondary | ICD-10-CM | POA: Diagnosis not present

## 2018-10-04 NOTE — Care Management Note (Signed)
Case Management Note  Patient Details  Name: [name] [name] MRN: [id number] Date of Birth: 03/14/1936  CM consulted for DME rolling walker and 3-in-1 as well as HH.  CM spoke with pt's spouse who advised he would like to use [facility] and he is picking up the DME from the [facility] [employer] [facility] store this morning.  CM spoke with Santiago Glad with [facility] who accepted pt for services.  Updated Dr. Wilson Singer via messages.  No further CM needs noted at this time.  Expected Discharge Date:   10/04/2018               Expected Discharge Plan:  [place]  In-House Referral:  Clinical Social Work  Discharge planning Services  CM Consult  Post Acute Care Choice:  Durable Medical Equipment, Home Health Choice offered to:  Patient, Spouse  DME Arranged:  Walker rolling, 3-N-1 DME Agency:  [place]:  RN, PT, Nurse's Aide [place] Agency:   [place]  Status of Service:  Completed, signed off  Rae Mar, RN 10/04/2018, 9:27 AM

## 2018-10-05 DIAGNOSIS — G4739 Other sleep apnea: Secondary | ICD-10-CM | POA: Diagnosis not present

## 2018-10-05 DIAGNOSIS — K5900 Constipation, unspecified: Secondary | ICD-10-CM | POA: Diagnosis not present

## 2018-10-05 DIAGNOSIS — M4854XD Collapsed vertebra, not elsewhere classified, thoracic region, subsequent encounter for fracture with routine healing: Secondary | ICD-10-CM | POA: Diagnosis not present

## 2018-10-05 DIAGNOSIS — K5730 Diverticulosis of large intestine without perforation or abscess without bleeding: Secondary | ICD-10-CM | POA: Diagnosis not present

## 2018-10-05 DIAGNOSIS — D649 Anemia, unspecified: Secondary | ICD-10-CM | POA: Diagnosis not present

## 2018-10-05 DIAGNOSIS — M19041 Primary osteoarthritis, right hand: Secondary | ICD-10-CM | POA: Diagnosis not present

## 2018-10-05 DIAGNOSIS — G43909 Migraine, unspecified, not intractable, without status migrainosus: Secondary | ICD-10-CM | POA: Diagnosis not present

## 2018-10-05 DIAGNOSIS — H3530 Unspecified macular degeneration: Secondary | ICD-10-CM | POA: Diagnosis not present

## 2018-10-05 DIAGNOSIS — M19042 Primary osteoarthritis, left hand: Secondary | ICD-10-CM | POA: Diagnosis not present

## 2018-10-05 DIAGNOSIS — K5909 Other constipation: Secondary | ICD-10-CM | POA: Diagnosis not present

## 2018-10-05 DIAGNOSIS — G4730 Sleep apnea, unspecified: Secondary | ICD-10-CM | POA: Diagnosis not present

## 2018-10-05 DIAGNOSIS — D6489 Other specified anemias: Secondary | ICD-10-CM | POA: Diagnosis not present

## 2018-10-06 ENCOUNTER — Other Ambulatory Visit: Payer: Self-pay | Admitting: Internal Medicine

## 2018-10-06 ENCOUNTER — Encounter: Payer: Self-pay | Admitting: Internal Medicine

## 2018-10-06 DIAGNOSIS — S32020A Wedge compression fracture of second lumbar vertebra, initial encounter for closed fracture: Secondary | ICD-10-CM

## 2018-10-06 DIAGNOSIS — K5900 Constipation, unspecified: Secondary | ICD-10-CM | POA: Diagnosis not present

## 2018-10-06 DIAGNOSIS — G43909 Migraine, unspecified, not intractable, without status migrainosus: Secondary | ICD-10-CM | POA: Diagnosis not present

## 2018-10-06 DIAGNOSIS — G4730 Sleep apnea, unspecified: Secondary | ICD-10-CM | POA: Diagnosis not present

## 2018-10-06 DIAGNOSIS — D649 Anemia, unspecified: Secondary | ICD-10-CM | POA: Diagnosis not present

## 2018-10-06 DIAGNOSIS — M19041 Primary osteoarthritis, right hand: Secondary | ICD-10-CM | POA: Diagnosis not present

## 2018-10-06 DIAGNOSIS — M19042 Primary osteoarthritis, left hand: Secondary | ICD-10-CM | POA: Diagnosis not present

## 2018-10-06 DIAGNOSIS — H3530 Unspecified macular degeneration: Secondary | ICD-10-CM | POA: Diagnosis not present

## 2018-10-06 DIAGNOSIS — K5730 Diverticulosis of large intestine without perforation or abscess without bleeding: Secondary | ICD-10-CM | POA: Diagnosis not present

## 2018-10-06 DIAGNOSIS — M4854XD Collapsed vertebra, not elsewhere classified, thoracic region, subsequent encounter for fracture with routine healing: Secondary | ICD-10-CM | POA: Diagnosis not present

## 2018-10-07 ENCOUNTER — Inpatient Hospital Stay
Admission: RE | Admit: 2018-10-07 | Discharge: 2018-10-07 | Disposition: A | Discharge status: Home / Self Care | Payer: Medicare HMO | Source: Ambulatory Visit | Attending: Internal Medicine | Admitting: Internal Medicine

## 2018-10-07 DIAGNOSIS — H3530 Unspecified macular degeneration: Secondary | ICD-10-CM | POA: Diagnosis not present

## 2018-10-07 DIAGNOSIS — G4730 Sleep apnea, unspecified: Secondary | ICD-10-CM | POA: Diagnosis not present

## 2018-10-07 DIAGNOSIS — D649 Anemia, unspecified: Secondary | ICD-10-CM | POA: Diagnosis not present

## 2018-10-07 DIAGNOSIS — M4854XD Collapsed vertebra, not elsewhere classified, thoracic region, subsequent encounter for fracture with routine healing: Secondary | ICD-10-CM | POA: Diagnosis not present

## 2018-10-07 DIAGNOSIS — G43909 Migraine, unspecified, not intractable, without status migrainosus: Secondary | ICD-10-CM | POA: Diagnosis not present

## 2018-10-07 DIAGNOSIS — M19041 Primary osteoarthritis, right hand: Secondary | ICD-10-CM | POA: Diagnosis not present

## 2018-10-07 DIAGNOSIS — K5900 Constipation, unspecified: Secondary | ICD-10-CM | POA: Diagnosis not present

## 2018-10-07 DIAGNOSIS — K5730 Diverticulosis of large intestine without perforation or abscess without bleeding: Secondary | ICD-10-CM | POA: Diagnosis not present

## 2018-10-07 DIAGNOSIS — M19042 Primary osteoarthritis, left hand: Secondary | ICD-10-CM | POA: Diagnosis not present

## 2018-10-10 DIAGNOSIS — H3530 Unspecified macular degeneration: Secondary | ICD-10-CM | POA: Diagnosis not present

## 2018-10-10 DIAGNOSIS — M19042 Primary osteoarthritis, left hand: Secondary | ICD-10-CM | POA: Diagnosis not present

## 2018-10-10 DIAGNOSIS — D649 Anemia, unspecified: Secondary | ICD-10-CM | POA: Diagnosis not present

## 2018-10-10 DIAGNOSIS — M4854XD Collapsed vertebra, not elsewhere classified, thoracic region, subsequent encounter for fracture with routine healing: Secondary | ICD-10-CM | POA: Diagnosis not present

## 2018-10-10 DIAGNOSIS — K5900 Constipation, unspecified: Secondary | ICD-10-CM | POA: Diagnosis not present

## 2018-10-10 DIAGNOSIS — K5730 Diverticulosis of large intestine without perforation or abscess without bleeding: Secondary | ICD-10-CM | POA: Diagnosis not present

## 2018-10-10 DIAGNOSIS — G43909 Migraine, unspecified, not intractable, without status migrainosus: Secondary | ICD-10-CM | POA: Diagnosis not present

## 2018-10-10 DIAGNOSIS — M19041 Primary osteoarthritis, right hand: Secondary | ICD-10-CM | POA: Diagnosis not present

## 2018-10-10 DIAGNOSIS — G4730 Sleep apnea, unspecified: Secondary | ICD-10-CM | POA: Diagnosis not present

## 2018-10-12 DIAGNOSIS — M4854XD Collapsed vertebra, not elsewhere classified, thoracic region, subsequent encounter for fracture with routine healing: Secondary | ICD-10-CM | POA: Diagnosis not present

## 2018-10-12 DIAGNOSIS — D649 Anemia, unspecified: Secondary | ICD-10-CM | POA: Diagnosis not present

## 2018-10-12 DIAGNOSIS — M19041 Primary osteoarthritis, right hand: Secondary | ICD-10-CM | POA: Diagnosis not present

## 2018-10-12 DIAGNOSIS — M19042 Primary osteoarthritis, left hand: Secondary | ICD-10-CM | POA: Diagnosis not present

## 2018-10-12 DIAGNOSIS — K5730 Diverticulosis of large intestine without perforation or abscess without bleeding: Secondary | ICD-10-CM | POA: Diagnosis not present

## 2018-10-12 DIAGNOSIS — G43909 Migraine, unspecified, not intractable, without status migrainosus: Secondary | ICD-10-CM | POA: Diagnosis not present

## 2018-10-12 DIAGNOSIS — K5900 Constipation, unspecified: Secondary | ICD-10-CM | POA: Diagnosis not present

## 2018-10-12 DIAGNOSIS — H3530 Unspecified macular degeneration: Secondary | ICD-10-CM | POA: Diagnosis not present

## 2018-10-12 DIAGNOSIS — G4730 Sleep apnea, unspecified: Secondary | ICD-10-CM | POA: Diagnosis not present

## 2018-10-14 DIAGNOSIS — M19041 Primary osteoarthritis, right hand: Secondary | ICD-10-CM | POA: Diagnosis not present

## 2018-10-14 DIAGNOSIS — H3530 Unspecified macular degeneration: Secondary | ICD-10-CM | POA: Diagnosis not present

## 2018-10-14 DIAGNOSIS — K5730 Diverticulosis of large intestine without perforation or abscess without bleeding: Secondary | ICD-10-CM | POA: Diagnosis not present

## 2018-10-14 DIAGNOSIS — D649 Anemia, unspecified: Secondary | ICD-10-CM | POA: Diagnosis not present

## 2018-10-14 DIAGNOSIS — M19042 Primary osteoarthritis, left hand: Secondary | ICD-10-CM | POA: Diagnosis not present

## 2018-10-14 DIAGNOSIS — G43909 Migraine, unspecified, not intractable, without status migrainosus: Secondary | ICD-10-CM | POA: Diagnosis not present

## 2018-10-14 DIAGNOSIS — G4730 Sleep apnea, unspecified: Secondary | ICD-10-CM | POA: Diagnosis not present

## 2018-10-14 DIAGNOSIS — M4854XD Collapsed vertebra, not elsewhere classified, thoracic region, subsequent encounter for fracture with routine healing: Secondary | ICD-10-CM | POA: Diagnosis not present

## 2018-10-14 DIAGNOSIS — K5900 Constipation, unspecified: Secondary | ICD-10-CM | POA: Diagnosis not present

## 2018-10-18 DIAGNOSIS — M19042 Primary osteoarthritis, left hand: Secondary | ICD-10-CM | POA: Diagnosis not present

## 2018-10-18 DIAGNOSIS — D649 Anemia, unspecified: Secondary | ICD-10-CM | POA: Diagnosis not present

## 2018-10-18 DIAGNOSIS — G4730 Sleep apnea, unspecified: Secondary | ICD-10-CM | POA: Diagnosis not present

## 2018-10-18 DIAGNOSIS — M19041 Primary osteoarthritis, right hand: Secondary | ICD-10-CM | POA: Diagnosis not present

## 2018-10-18 DIAGNOSIS — G43909 Migraine, unspecified, not intractable, without status migrainosus: Secondary | ICD-10-CM | POA: Diagnosis not present

## 2018-10-18 DIAGNOSIS — M4854XD Collapsed vertebra, not elsewhere classified, thoracic region, subsequent encounter for fracture with routine healing: Secondary | ICD-10-CM | POA: Diagnosis not present

## 2018-10-18 DIAGNOSIS — H3530 Unspecified macular degeneration: Secondary | ICD-10-CM | POA: Diagnosis not present

## 2018-10-18 DIAGNOSIS — K5900 Constipation, unspecified: Secondary | ICD-10-CM | POA: Diagnosis not present

## 2018-10-18 DIAGNOSIS — K5730 Diverticulosis of large intestine without perforation or abscess without bleeding: Secondary | ICD-10-CM | POA: Diagnosis not present

## 2018-10-19 DIAGNOSIS — K5730 Diverticulosis of large intestine without perforation or abscess without bleeding: Secondary | ICD-10-CM | POA: Diagnosis not present

## 2018-10-19 DIAGNOSIS — G43909 Migraine, unspecified, not intractable, without status migrainosus: Secondary | ICD-10-CM | POA: Diagnosis not present

## 2018-10-19 DIAGNOSIS — G4730 Sleep apnea, unspecified: Secondary | ICD-10-CM | POA: Diagnosis not present

## 2018-10-19 DIAGNOSIS — M4854XD Collapsed vertebra, not elsewhere classified, thoracic region, subsequent encounter for fracture with routine healing: Secondary | ICD-10-CM | POA: Diagnosis not present

## 2018-10-19 DIAGNOSIS — D649 Anemia, unspecified: Secondary | ICD-10-CM | POA: Diagnosis not present

## 2018-10-19 DIAGNOSIS — K5900 Constipation, unspecified: Secondary | ICD-10-CM | POA: Diagnosis not present

## 2018-10-19 DIAGNOSIS — M19041 Primary osteoarthritis, right hand: Secondary | ICD-10-CM | POA: Diagnosis not present

## 2018-10-19 DIAGNOSIS — H3530 Unspecified macular degeneration: Secondary | ICD-10-CM | POA: Diagnosis not present

## 2018-10-19 DIAGNOSIS — M19042 Primary osteoarthritis, left hand: Secondary | ICD-10-CM | POA: Diagnosis not present

## 2018-10-20 DIAGNOSIS — M19042 Primary osteoarthritis, left hand: Secondary | ICD-10-CM | POA: Diagnosis not present

## 2018-10-20 DIAGNOSIS — G4730 Sleep apnea, unspecified: Secondary | ICD-10-CM | POA: Diagnosis not present

## 2018-10-20 DIAGNOSIS — K5730 Diverticulosis of large intestine without perforation or abscess without bleeding: Secondary | ICD-10-CM | POA: Diagnosis not present

## 2018-10-20 DIAGNOSIS — G43909 Migraine, unspecified, not intractable, without status migrainosus: Secondary | ICD-10-CM | POA: Diagnosis not present

## 2018-10-20 DIAGNOSIS — M19041 Primary osteoarthritis, right hand: Secondary | ICD-10-CM | POA: Diagnosis not present

## 2018-10-20 DIAGNOSIS — M4854XD Collapsed vertebra, not elsewhere classified, thoracic region, subsequent encounter for fracture with routine healing: Secondary | ICD-10-CM | POA: Diagnosis not present

## 2018-10-20 DIAGNOSIS — K5900 Constipation, unspecified: Secondary | ICD-10-CM | POA: Diagnosis not present

## 2018-10-20 DIAGNOSIS — D649 Anemia, unspecified: Secondary | ICD-10-CM | POA: Diagnosis not present

## 2018-10-20 DIAGNOSIS — H3530 Unspecified macular degeneration: Secondary | ICD-10-CM | POA: Diagnosis not present

## 2018-10-21 DIAGNOSIS — M19041 Primary osteoarthritis, right hand: Secondary | ICD-10-CM | POA: Diagnosis not present

## 2018-10-21 DIAGNOSIS — M19042 Primary osteoarthritis, left hand: Secondary | ICD-10-CM | POA: Diagnosis not present

## 2018-10-21 DIAGNOSIS — G43909 Migraine, unspecified, not intractable, without status migrainosus: Secondary | ICD-10-CM | POA: Diagnosis not present

## 2018-10-21 DIAGNOSIS — K5730 Diverticulosis of large intestine without perforation or abscess without bleeding: Secondary | ICD-10-CM | POA: Diagnosis not present

## 2018-10-21 DIAGNOSIS — H3530 Unspecified macular degeneration: Secondary | ICD-10-CM | POA: Diagnosis not present

## 2018-10-21 DIAGNOSIS — K5900 Constipation, unspecified: Secondary | ICD-10-CM | POA: Diagnosis not present

## 2018-10-21 DIAGNOSIS — D649 Anemia, unspecified: Secondary | ICD-10-CM | POA: Diagnosis not present

## 2018-10-21 DIAGNOSIS — G4730 Sleep apnea, unspecified: Secondary | ICD-10-CM | POA: Diagnosis not present

## 2018-10-21 DIAGNOSIS — M4854XD Collapsed vertebra, not elsewhere classified, thoracic region, subsequent encounter for fracture with routine healing: Secondary | ICD-10-CM | POA: Diagnosis not present

## 2018-10-25 DIAGNOSIS — D649 Anemia, unspecified: Secondary | ICD-10-CM | POA: Diagnosis not present

## 2018-10-25 DIAGNOSIS — M19041 Primary osteoarthritis, right hand: Secondary | ICD-10-CM | POA: Diagnosis not present

## 2018-10-25 DIAGNOSIS — H3530 Unspecified macular degeneration: Secondary | ICD-10-CM | POA: Diagnosis not present

## 2018-10-25 DIAGNOSIS — G4730 Sleep apnea, unspecified: Secondary | ICD-10-CM | POA: Diagnosis not present

## 2018-10-25 DIAGNOSIS — M19042 Primary osteoarthritis, left hand: Secondary | ICD-10-CM | POA: Diagnosis not present

## 2018-10-25 DIAGNOSIS — G43909 Migraine, unspecified, not intractable, without status migrainosus: Secondary | ICD-10-CM | POA: Diagnosis not present

## 2018-10-25 DIAGNOSIS — M4854XD Collapsed vertebra, not elsewhere classified, thoracic region, subsequent encounter for fracture with routine healing: Secondary | ICD-10-CM | POA: Diagnosis not present

## 2018-10-25 DIAGNOSIS — K5900 Constipation, unspecified: Secondary | ICD-10-CM | POA: Diagnosis not present

## 2018-10-25 DIAGNOSIS — K5730 Diverticulosis of large intestine without perforation or abscess without bleeding: Secondary | ICD-10-CM | POA: Diagnosis not present

## 2018-10-28 DIAGNOSIS — M19041 Primary osteoarthritis, right hand: Secondary | ICD-10-CM | POA: Diagnosis not present

## 2018-10-28 DIAGNOSIS — K5730 Diverticulosis of large intestine without perforation or abscess without bleeding: Secondary | ICD-10-CM | POA: Diagnosis not present

## 2018-10-28 DIAGNOSIS — K5900 Constipation, unspecified: Secondary | ICD-10-CM | POA: Diagnosis not present

## 2018-10-28 DIAGNOSIS — G43909 Migraine, unspecified, not intractable, without status migrainosus: Secondary | ICD-10-CM | POA: Diagnosis not present

## 2018-10-28 DIAGNOSIS — G4730 Sleep apnea, unspecified: Secondary | ICD-10-CM | POA: Diagnosis not present

## 2018-10-28 DIAGNOSIS — M19042 Primary osteoarthritis, left hand: Secondary | ICD-10-CM | POA: Diagnosis not present

## 2018-10-28 DIAGNOSIS — M4854XD Collapsed vertebra, not elsewhere classified, thoracic region, subsequent encounter for fracture with routine healing: Secondary | ICD-10-CM | POA: Diagnosis not present

## 2018-10-28 DIAGNOSIS — H3530 Unspecified macular degeneration: Secondary | ICD-10-CM | POA: Diagnosis not present

## 2018-10-28 DIAGNOSIS — D649 Anemia, unspecified: Secondary | ICD-10-CM | POA: Diagnosis not present

## 2018-10-31 DIAGNOSIS — G43909 Migraine, unspecified, not intractable, without status migrainosus: Secondary | ICD-10-CM | POA: Diagnosis not present

## 2018-10-31 DIAGNOSIS — D649 Anemia, unspecified: Secondary | ICD-10-CM | POA: Diagnosis not present

## 2018-10-31 DIAGNOSIS — M4854XD Collapsed vertebra, not elsewhere classified, thoracic region, subsequent encounter for fracture with routine healing: Secondary | ICD-10-CM | POA: Diagnosis not present

## 2018-10-31 DIAGNOSIS — M19041 Primary osteoarthritis, right hand: Secondary | ICD-10-CM | POA: Diagnosis not present

## 2018-10-31 DIAGNOSIS — G4730 Sleep apnea, unspecified: Secondary | ICD-10-CM | POA: Diagnosis not present

## 2018-10-31 DIAGNOSIS — K5730 Diverticulosis of large intestine without perforation or abscess without bleeding: Secondary | ICD-10-CM | POA: Diagnosis not present

## 2018-10-31 DIAGNOSIS — K5900 Constipation, unspecified: Secondary | ICD-10-CM | POA: Diagnosis not present

## 2018-10-31 DIAGNOSIS — M19042 Primary osteoarthritis, left hand: Secondary | ICD-10-CM | POA: Diagnosis not present

## 2018-10-31 DIAGNOSIS — H3530 Unspecified macular degeneration: Secondary | ICD-10-CM | POA: Diagnosis not present

## 2018-11-03 DIAGNOSIS — M4854XD Collapsed vertebra, not elsewhere classified, thoracic region, subsequent encounter for fracture with routine healing: Secondary | ICD-10-CM | POA: Diagnosis not present

## 2018-11-03 DIAGNOSIS — M19041 Primary osteoarthritis, right hand: Secondary | ICD-10-CM | POA: Diagnosis not present

## 2018-11-03 DIAGNOSIS — K5900 Constipation, unspecified: Secondary | ICD-10-CM | POA: Diagnosis not present

## 2018-11-03 DIAGNOSIS — K5730 Diverticulosis of large intestine without perforation or abscess without bleeding: Secondary | ICD-10-CM | POA: Diagnosis not present

## 2018-11-03 DIAGNOSIS — M19042 Primary osteoarthritis, left hand: Secondary | ICD-10-CM | POA: Diagnosis not present

## 2018-11-03 DIAGNOSIS — G43909 Migraine, unspecified, not intractable, without status migrainosus: Secondary | ICD-10-CM | POA: Diagnosis not present

## 2018-11-03 DIAGNOSIS — H3530 Unspecified macular degeneration: Secondary | ICD-10-CM | POA: Diagnosis not present

## 2018-11-03 DIAGNOSIS — G4730 Sleep apnea, unspecified: Secondary | ICD-10-CM | POA: Diagnosis not present

## 2018-11-03 DIAGNOSIS — D649 Anemia, unspecified: Secondary | ICD-10-CM | POA: Diagnosis not present

## 2018-11-09 DIAGNOSIS — D649 Anemia, unspecified: Secondary | ICD-10-CM | POA: Diagnosis not present

## 2018-11-09 DIAGNOSIS — G4730 Sleep apnea, unspecified: Secondary | ICD-10-CM | POA: Diagnosis not present

## 2018-11-09 DIAGNOSIS — H3530 Unspecified macular degeneration: Secondary | ICD-10-CM | POA: Diagnosis not present

## 2018-11-09 DIAGNOSIS — K5900 Constipation, unspecified: Secondary | ICD-10-CM | POA: Diagnosis not present

## 2018-11-09 DIAGNOSIS — M4854XD Collapsed vertebra, not elsewhere classified, thoracic region, subsequent encounter for fracture with routine healing: Secondary | ICD-10-CM | POA: Diagnosis not present

## 2018-11-09 DIAGNOSIS — M19042 Primary osteoarthritis, left hand: Secondary | ICD-10-CM | POA: Diagnosis not present

## 2018-11-09 DIAGNOSIS — M19041 Primary osteoarthritis, right hand: Secondary | ICD-10-CM | POA: Diagnosis not present

## 2018-11-09 DIAGNOSIS — K5730 Diverticulosis of large intestine without perforation or abscess without bleeding: Secondary | ICD-10-CM | POA: Diagnosis not present

## 2018-11-09 DIAGNOSIS — G43909 Migraine, unspecified, not intractable, without status migrainosus: Secondary | ICD-10-CM | POA: Diagnosis not present

## 2018-11-16 DIAGNOSIS — H3530 Unspecified macular degeneration: Secondary | ICD-10-CM | POA: Diagnosis not present

## 2018-11-16 DIAGNOSIS — K5730 Diverticulosis of large intestine without perforation or abscess without bleeding: Secondary | ICD-10-CM | POA: Diagnosis not present

## 2018-11-16 DIAGNOSIS — G4730 Sleep apnea, unspecified: Secondary | ICD-10-CM | POA: Diagnosis not present

## 2018-11-16 DIAGNOSIS — D649 Anemia, unspecified: Secondary | ICD-10-CM | POA: Diagnosis not present

## 2018-11-16 DIAGNOSIS — M19041 Primary osteoarthritis, right hand: Secondary | ICD-10-CM | POA: Diagnosis not present

## 2018-11-16 DIAGNOSIS — K5900 Constipation, unspecified: Secondary | ICD-10-CM | POA: Diagnosis not present

## 2018-11-16 DIAGNOSIS — G43909 Migraine, unspecified, not intractable, without status migrainosus: Secondary | ICD-10-CM | POA: Diagnosis not present

## 2018-11-16 DIAGNOSIS — M4854XD Collapsed vertebra, not elsewhere classified, thoracic region, subsequent encounter for fracture with routine healing: Secondary | ICD-10-CM | POA: Diagnosis not present

## 2018-11-16 DIAGNOSIS — M19042 Primary osteoarthritis, left hand: Secondary | ICD-10-CM | POA: Diagnosis not present

## 2018-11-17 DIAGNOSIS — Z6822 Body mass index (BMI) 22.0-22.9, adult: Secondary | ICD-10-CM | POA: Diagnosis not present

## 2018-11-17 DIAGNOSIS — M545 Low back pain: Secondary | ICD-10-CM | POA: Diagnosis not present

## 2018-11-17 DIAGNOSIS — M4854XS Collapsed vertebra, not elsewhere classified, thoracic region, sequela of fracture: Secondary | ICD-10-CM | POA: Diagnosis not present

## 2018-12-02 ENCOUNTER — Ambulatory Visit: Payer: Medicare HMO | Attending: Internal Medicine

## 2018-12-02 ENCOUNTER — Other Ambulatory Visit: Payer: Self-pay

## 2018-12-02 DIAGNOSIS — R42 Dizziness and giddiness: Secondary | ICD-10-CM | POA: Diagnosis not present

## 2018-12-02 DIAGNOSIS — R2689 Other abnormalities of gait and mobility: Secondary | ICD-10-CM | POA: Insufficient documentation

## 2018-12-02 DIAGNOSIS — H8113 Benign paroxysmal vertigo, bilateral: Secondary | ICD-10-CM | POA: Diagnosis not present

## 2018-12-02 NOTE — Therapy (Signed)
[name] [name] [address] [place], [place], [address] Phone: [phone]   Fax:  [phone]  Physical Therapy Evaluation  Patient Details  Name: [name] [name] MRN: [id number] Date of Birth: [birth year] Referring Provider (PT): Dr. Brigitte Pulse   Encounter Date: 12/02/2018  PT End of Session - 12/02/18 1214    Visit Number  1    Number of Visits  9    Date for PT Re-Evaluation  01/31/19    Authorization Type  [employer]    PT Start Time  [phone]    PT Stop Time  1024    PT Time Calculation (min)  47 min    Equipment Utilized During Treatment  Back brace   S for safety   Activity Tolerance  Patient tolerated treatment well    Behavior During Therapy  [facility] for tasks assessed/performed       Past Medical History:  Diagnosis Date  . Acid reflux disease   . Anemia    history of  . Arthritis    both hands  . Blood transfusion without reported diagnosis   . Cataract    bilateral cateracts removed  . Common migraine   . Diverticulosis    patient denies  . Esophageal stricture   . Herniated disc   . Hiatal hernia 2009   EGD-Small   . History of colonic polyps   . Hyperlipidemia   . Lacunar infarction ([place])   . Macular degeneration   . Sleep apnea    has c-pap but does not wear    Past Surgical History:  Procedure Laterality Date  . CHOLECYSTECTOMY     4-5 YRS AGO.  Marland Kitchen COLONOSCOPY    . ESOPHAGEAL DILATION    . EYE SURGERY     BILATERAL CATARAT  . INGUINAL HERNIA REPAIR     rt.  Marland Kitchen RECTOCELE REPAIR N/A 02/13/2013   Procedure: POSTERIOR REPAIR (RECTOCELE);  Surgeon: Emily Filbert, MD;  Location: [facility];  Service: Gynecology;  Laterality: N/A;  . ROBOTIC ASSISTED LAPAROSCOPIC LYSIS OF ADHESION  02/13/2016   Procedure: ROBOTIC ASSISTED LAPAROSCOPIC LYSIS OF ADHESION;  Surgeon: Princess Bruins, MD;  Location: [facility];  Service: Gynecology;;  . ROBOTIC ASSISTED LAPAROSCOPIC SACROCOLPOPEXY N/A 02/13/2016   Procedure:  ROBOTIC ASSISTED LAPAROSCOPIC SACROCOLPOPEXY With Theola Sequin;  Surgeon: Princess Bruins, MD;  Location: [facility];  Service: Gynecology;  Laterality: N/A;  . TOTAL ABDOMINAL HYSTERECTOMY    . UPPER GASTROINTESTINAL ENDOSCOPY      There were no vitals filed for this visit.   Subjective Assessment - 12/02/18 0948    Subjective  Pt presenting for dizziness and unsteady gait but reported she had a recent T12 compression fracture and must wear TLSO brace with the following precautions: no bending, twisting, lifting, or arching. She has back brace donned. Pt suspects back fx occurred when sitting on folding metal chairs. She had PT for 10 weeks but has ceased. Pt to use RW due to compression Fx, she did not use an AD.  Pt had x-ray last week and fx is healing (T12). Dizziness is worse when getting up in the morning, lying down, and when looking up (spinning). Pt reports dizziness began around the middle of November and describes it as spinning. At worst: 4-5/10 and 0/10 at best. Pt states dizziness lasts only a few seconds and resolves with rest. Pt denied N/T, HA, or tinnitus.     Patient is accompained by:  Family member   husband: [name] [name]  Pertinent History  Macular degeneration, hx of cataracts (B extracted), Chronic daily HA, OSA on CPAP (but pt reports she does not use CPAP because it falls off and she doesn't like it), dysphagia, DJD, spondylisthesis of L3 on L4 and L4 on L5, HLD    Patient Stated Goals  To get dizziness to cease.     Currently in Pain?  No/denies         [facility] PT Assessment - 12/02/18 0959      Assessment   Medical Diagnosis  Unsteady gait, dizziness    Referring Provider (PT)  Dr. Brigitte Pulse    Onset Date/Surgical Date  10/15/19    Hand Dominance  Right    Prior Therapy  HHPT for T12 compression fx      Precautions   Precautions  Fall;Back    Precaution Comments  need to verify precautions with Dr. Brigitte Pulse. NP's note indicates pt can wear TLSO brace ([place]) prn but no  precautions documented but pt reported she's unable to twist, bend, lift, or arch.       Restrictions   Weight Bearing Restrictions  No      Balance Screen   Has the patient fallen in the past 6 months  Yes    How many times?  1    Has the patient had a decrease in activity level because of a fear of falling?   No    Is the patient reluctant to leave their home because of a fear of falling?   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       Ambulation/Gait   Ambulation/Gait  Yes    Ambulation/Gait Assistance  5: Supervision    Ambulation/Gait Assistance Details  S for safety. Pt noted to veer off main trajectory with RW.     Ambulation Distance (Feet)  100 Feet    Assistive device  Rolling walker    Gait Pattern  Step-through pattern;Decreased stride length;Narrow base of support   intermittent narrow BOS   Ambulation Surface  Level;Indoor    Gait velocity  2.55ft/sec. with RW           Vestibular Assessment - 12/02/18 1012      Vestibular Assessment   General Observation  Pt reported dizziness began about "halfway through recovery from back fx."      Symptom Behavior   Type of Dizziness  Spinning    Frequency of Dizziness  Daily    Duration of Dizziness  A few seconds    Aggravating Factors  Lying supine;Looking up to the ceiling;Supine to  sit    Relieving Factors  Rest      Occulomotor  Exam   Occulomotor Alignment  Normal    Spontaneous  Absent    Gaze-induced  Absent    Smooth Pursuits  Intact    Saccades  Intact    Comment  Pt had to focus on larger object then a pen 2/2 macular degenration. No dizziness reported during exam.       Vestibulo-Occular Reflex   VOR 1 Head Only (x 1 viewing)  WNL, no dizziness reported.     VOR Cancellation  Normal    Comment  PT did not perform HIT 2/2 pt reported hx of Cx spine issues and pt not comfortable relaxing neck.       Positional Testing   Dix-Hallpike  --   Deferred until back precautions clarified by MD.          Objective measurements completed on examination: See above findings.              PT Education - 12/02/18 1211    Education Details  PT discussed exam findings, the need for back precautions from Dr. Brigitte Pulse (PT will contact office), the likelihood of positional vertigo based on s/s and exam. PT discussed outcome measure results.     Person(s) Educated  Patient;Spouse    Methods  Explanation    Comprehension  Verbalized understanding       PT Short Term Goals - 12/02/18 1230      PT SHORT TERM GOAL #1   Title  same as LTGs        PT Long Term Goals - 12/02/18 1230      PT LONG TERM GOAL #1   Title  Perform positional testing and treat as indicated, once back precautions verified. TARGET DATE FOR ALL LTGS: 12/30/18    Status  New      PT LONG TERM GOAL #2   Title  Perform DGI and write goal as indicated.     Status  New      PT LONG TERM GOAL #3   Title  Pt will amb. with LRAD to >/=2.39ft/sec to safely amb. in the community.     Status  New      PT LONG TERM GOAL #4   Title  Pt will amb. 500' over even/uneven terrain with LRAD at MOD I level to improve functional mobility.     Status  New      PT LONG TERM GOAL #5   Title  Pt will be IND with HEP in order to improve dizziness, strength, and balance.     Status  New              Plan - 12/02/18 1217    Clinical Impression Statement  Pt is a pleasant 83y/o female presenting to OPPT neuro for unsteady gait and dizziness. However, pt also reported recent T12 compression fx which requires TLSO brace and no lifting, bending, twisting, or arching. PT will contact MD to confirm need for brace and when precautions are lifted as this may impede treatment of vestibular issues. Pt's PMH is significant for the following: Macular degeneration, hx of cataracts (B extracted), Chronic daily HA, OSA on CPAP (but pt reports she does not use CPAP because it falls off and she doesn't like it), dysphagia, DJD, spondylisthesis of L3 on L4 and L4 on L5, HLD. Pt's gait speed indicates is unable to safely ambulate in the community. Pt's oculomotor exam performed with larger object than pen 2/2 pt's hx of macular degeneration but did not cause  concordant dizziness. PT did not perform positional testing today 2/2 need MD to verify back precautions and brace. PT will perform formal balance assessment once dizziness addressed. Pt likley has positional vertigo based on subjective reports and negative oculomotor exam. The following deficits were noted upon exam: gait deviations, dizziness, limited back ROM 2/2 compression fx/brace, strength and balance not formally assessed but weakness and balance deficits suspected based on hx. Pt would benefit from skilled PT to improve dizziness and safety during functional mobility.     History and Personal Factors relevant to plan of care:  Pt with T12 compression fx with unknown precautions (will verify with MD), unable to volunteer since compression fx and pt very active/IND prior to fx    Clinical Presentation  Evolving    Clinical Presentation due to:  Macular degeneration, lacunar infarct per chart, osteopenia, hx of cataracts (B extracted), Chronic daily HA, OSA on CPAP (but pt reports she does not use CPAP because it falls off and she doesn't like it),  dysphagia, DJD, spondylisthesis of L3 on L4 and L4 on L5, HLD    Rehab Potential  Good    Clinical Impairments Affecting Rehab Potential  please see above.     PT Frequency  2x / week    PT Duration  4 weeks    PT Treatment/Interventions  ADLs/Self Care Home Management;Biofeedback;Canalith Repostioning;Functional mobility training;Gait training;Stair training;Patient/family education;Orthotic Fit/Training;DME Instruction;Neuromuscular re-education;Balance training;Therapeutic exercise;Therapeutic activities;Manual techniques;Vestibular   need to verify back precautions   PT Next Visit Plan  Verify back precautions, then perform positional testing and treat as indicated. Perform DGI and write goals as indicated.     Consulted and Agree with Plan of Care  Patient;Family member/caregiver    Family Member Consulted  Jim: husband       Patient will benefit from skilled therapeutic intervention in order to improve the following deficits and impairments:  Abnormal gait, Impaired flexibility, Decreased balance, Decreased mobility, Postural dysfunction, Dizziness, Decreased endurance, Decreased knowledge of precautions, Decreased knowledge of use of DME, Decreased range of motion, Decreased strength  Visit Diagnosis: Dizziness and giddiness - Plan: PT plan of care cert/re-cert  Other abnormalities of gait and mobility - Plan: PT plan of care cert/re-cert  BPPV (benign paroxysmal positional vertigo), bilateral - Plan: PT plan of care cert/re-cert     Problem List Patient Active Problem List   Diagnosis Date Noted  . Postoperative state 02/13/2016  . Chest pain with moderate risk of acute coronary syndrome 10/08/2014  . Sepsis syndrome 06/26/2013  . Acute pyelonephritis 06/26/2013  . Migraine 03/28/2013  . Rectocele 02/13/2013  . Sleep apnea 02/16/2012  . Chronic daily headache 01/12/2012  . OSTEOPENIA 10/17/2008  . ESOPHAGEAL STRICTURE 05/02/2008  . DIVERTICULOSIS-COLON 05/02/2008  .  DIVERTICULITIS-COLON 05/02/2008  . ABDOMINAL PAIN-EPIGASTRIC 05/02/2008  . GASTRITIS, ACUTE 03/27/2008  . LEG CRAMPS, NOCTURNAL 02/10/2008  . Headache(784.0) 02/10/2008  . OTHER DYSPHAGIA 02/10/2008  . HYPERLIPIDEMIA 02/06/2008  . LACUNAR INFARCTION 02/06/2008  . COLONIC POLYPS 10/13/2007  . Migraine without aura 09/27/2007  . Esophageal reflux 09/27/2007  . HERNIATED DISC 09/27/2007    Soha Thorup L 12/02/2018, 12:33 PM  [facility] [address] [place], [place], [address] Phone: [phone]   Fax:  [phone]  Name: JEORGIA HELMING MRN: [id number] Date of Birth: 10-24-1936   Geoffry Paradise, PT,DPT 12/02/18 12:34 PM Phone: [phone] Fax: [phone]

## 2018-12-16 ENCOUNTER — Ambulatory Visit: Payer: Medicare HMO

## 2018-12-16 DIAGNOSIS — H8113 Benign paroxysmal vertigo, bilateral: Secondary | ICD-10-CM

## 2018-12-16 DIAGNOSIS — R42 Dizziness and giddiness: Secondary | ICD-10-CM

## 2018-12-16 DIAGNOSIS — R2689 Other abnormalities of gait and mobility: Secondary | ICD-10-CM | POA: Diagnosis not present

## 2018-12-16 NOTE — Therapy (Signed)
[facility] [address] [place], [place], [address] Phone: [phone]   Fax:  [phone]  Physical Therapy Treatment  Patient Details  Name: [name] [name] MRN: [id number] Date of Birth: March 29, 1936 Referring Provider (PT): Dr. Brigitte Pulse   Encounter Date: 12/16/2018  PT End of Session - 12/16/18 1210    Visit Number  2    Number of Visits  9    Date for PT Re-Evaluation  01/31/19    Authorization Type  [employer]    PT Start Time  1018    PT Stop Time  1102    PT Time Calculation (min)  44 min    Equipment Utilized During Treatment  --   min guard to S prn   Activity Tolerance  Patient tolerated treatment well    Behavior During Therapy  [facility] for tasks assessed/performed       Past Medical History:  Diagnosis Date  . Acid reflux disease   . Anemia    history of  . Arthritis    both hands  . Blood transfusion without reported diagnosis   . Cataract    bilateral cateracts removed  . Common migraine   . Diverticulosis    patient denies  . Esophageal stricture   . Herniated disc   . Hiatal hernia 2009   EGD-Small   . History of colonic polyps   . Hyperlipidemia   . Lacunar infarction ([place])   . Macular degeneration   . Sleep apnea    has c-pap but does not wear    Past Surgical History:  Procedure Laterality Date  . CHOLECYSTECTOMY     4-5 YRS AGO.  Marland Kitchen COLONOSCOPY    . ESOPHAGEAL DILATION    . EYE SURGERY     BILATERAL CATARAT  . INGUINAL HERNIA REPAIR     rt.  Marland Kitchen RECTOCELE REPAIR N/A 02/13/2013   Procedure: POSTERIOR REPAIR (RECTOCELE);  Surgeon: Emily Filbert, MD;  Location: [facility];  Service: Gynecology;  Laterality: N/A;  . ROBOTIC ASSISTED LAPAROSCOPIC LYSIS OF ADHESION  02/13/2016   Procedure: ROBOTIC ASSISTED LAPAROSCOPIC LYSIS OF ADHESION;  Surgeon: Princess Bruins, MD;  Location: [facility];  Service: Gynecology;;  . ROBOTIC ASSISTED LAPAROSCOPIC SACROCOLPOPEXY N/A 02/13/2016   Procedure:  ROBOTIC ASSISTED LAPAROSCOPIC SACROCOLPOPEXY With Theola Sequin;  Surgeon: Princess Bruins, MD;  Location: [facility];  Service: Gynecology;  Laterality: N/A;  . TOTAL ABDOMINAL HYSTERECTOMY    . UPPER GASTROINTESTINAL ENDOSCOPY      There were no vitals filed for this visit.  Subjective Assessment - 12/16/18 1023    Subjective  Pt reported she is still wearing brace and still sleeps in a guarded position. Pt still feels spinning sensation when she gets OOB. Pt hasn't been walking much or moving.     Patient is accompained by:  Family member    Pertinent History  Macular degeneration, hx of cataracts (B extracted), Chronic daily HA, OSA on CPAP (but pt reports she does not use CPAP because it falls off and she doesn't like it), dysphagia, DJD, spondylisthesis of L3 on L4 and L4 on L5, HLD    Patient Stated Goals  To get dizziness to cease.     Currently in Pain?  Yes    Pain Score  --   7-8/10   Pain Location  Flank    Pain Orientation  Right    Pain Descriptors / Indicators  Aching;Nagging;Constant    Pain Type  Chronic pain  Pain Onset  1 to 4 weeks ago    Pain Frequency  Constant    Aggravating Factors   moving in general     Pain Relieving Factors  lying down in supine and sidelying             Vestibular Assessment - 12/16/18 1032      Positional Testing   Sidelying Test  Sidelying Right;Sidelying Left    Horizontal Canal Testing  Horizontal Canal Right;Horizontal Canal Left      Sidelying Right   Sidelying Right Duration  < 10 sec., with 7-8/10 dizziness    Sidelying Right Symptoms  Upbeat, right rotatory nystagmus      Sidelying Left   Sidelying Left Duration  0    Sidelying Left Symptoms  No nystagmus      Horizontal Canal Right   Horizontal Canal Right Duration  0    Horizontal Canal Right Symptoms  Normal      Horizontal Canal Left   Horizontal Canal Left Duration  0    Horizontal Canal Left Symptoms  Normal                Vestibular  Treatment/Exercise - 12/16/18 1208      Vestibular Treatment/Exercise   Vestibular Treatment Provided  Canalith Repositioning    Canalith Repositioning  Semont Procedure Right Posterior      Semont Procedure Right Posterior   Number of Reps   2    Overall Response   Symptoms Resolved    Response Details   Decr. speed during treatment 2/2 back pain. Pt denied dizziness after treatment and no nystagmus noted at end of session.            Self Care: PT Education - 12/16/18 1209    Education Details  PT educated pt that Dr. Raul Del office sent note to PT indicating pt no longer had back precautions and does need back brace donned, therefore, pt doffed back brace for treatment today. PT encouraged pt to notify MD if back pain persists or increases. Extensive time spent educating pt on BPPV and exam findings. PT educated pt that they would need to contact Dr. Raul Del office to receive paperwork re: back precautions lifted. PT educated pt that she might feel wooziness for 24-48 hours after treatment for BPPV.     Person(s) Educated  Patient;Spouse    Methods  Explanation    Comprehension  Verbalized understanding       PT Short Term Goals - 12/02/18 1230      PT SHORT TERM GOAL #1   Title  same as LTGs        PT Long Term Goals - 12/16/18 1215      PT LONG TERM GOAL #1   Title  Perform positional testing and treat as indicated, once back precautions verified. TARGET DATE FOR ALL LTGS: 12/30/18    Status  Achieved      PT LONG TERM GOAL #2   Title  Perform DGI and write goal as indicated.     Status  New      PT LONG TERM GOAL #3   Title  Pt will amb. with LRAD to >/=2.77ft/sec to safely amb. in the community.     Status  New      PT LONG TERM GOAL #4   Title  Pt will amb. 500' over even/uneven terrain with LRAD at MOD I level to improve functional mobility.     Status  New  PT LONG TERM GOAL #5   Title  Pt will be IND with HEP in order to improve dizziness, strength,  and balance.     Status  New      Additional Long Term Goals   Additional Long Term Goals  Yes      PT LONG TERM GOAL #6   Title  Pt will report 0/10 dizziness during all positional testing to improve QOL and safety.     Baseline  7-8/10 dizziness during R sidelying positional testing    Status  New            Plan - 12/16/18 1212    Clinical Impression Statement  Today's skilled session focused on positional testing, as pt's back precautions are lifted per Dr. Brigitte Pulse. All positional testing negative, except for during R sidelying testing-pt experienced R upbeating torsional nystagmus and concordant dizziness, indicating R pBPPV canalithiasis. S/s resolved after Semont treatment. Pt would continue to benefit from skilled PT to improve dizziness and safety during functional mobility. PT will preform DGI next session and treat balance and BPPV as indicated.     Rehab Potential  Good    Clinical Impairments Affecting Rehab Potential  please see above.     PT Frequency  2x / week    PT Duration  4 weeks    PT Treatment/Interventions  ADLs/Self Care Home Management;Biofeedback;Canalith Repostioning;Functional mobility training;Gait training;Stair training;Patient/family education;Orthotic Fit/Training;DME Instruction;Neuromuscular re-education;Balance training;Therapeutic exercise;Therapeutic activities;Manual techniques;Vestibular   need to verify back precautions   PT Next Visit Plan  Continue to treat R pBPPV with semont as indicated. Perform DGI and write goals as indicated.     Consulted and Agree with Plan of Care  Patient;Family member/caregiver    Family Member Consulted  Jim: husband       Patient will benefit from skilled therapeutic intervention in order to improve the following deficits and impairments:  Abnormal gait, Impaired flexibility, Decreased balance, Decreased mobility, Postural dysfunction, Dizziness, Decreased endurance, Decreased knowledge of precautions, Decreased  knowledge of use of DME, Decreased range of motion, Decreased strength  Visit Diagnosis: Dizziness and giddiness  BPPV (benign paroxysmal positional vertigo), bilateral     Problem List Patient Active Problem List   Diagnosis Date Noted  . Postoperative state 02/13/2016  . Chest pain with moderate risk of acute coronary syndrome 10/08/2014  . Sepsis syndrome 06/26/2013  . Acute pyelonephritis 06/26/2013  . Migraine 03/28/2013  . Rectocele 02/13/2013  . Sleep apnea 02/16/2012  . Chronic daily headache 01/12/2012  . OSTEOPENIA 10/17/2008  . ESOPHAGEAL STRICTURE 05/02/2008  . DIVERTICULOSIS-COLON 05/02/2008  . DIVERTICULITIS-COLON 05/02/2008  . ABDOMINAL PAIN-EPIGASTRIC 05/02/2008  . GASTRITIS, ACUTE 03/27/2008  . LEG CRAMPS, NOCTURNAL 02/10/2008  . Headache(784.0) 02/10/2008  . OTHER DYSPHAGIA 02/10/2008  . HYPERLIPIDEMIA 02/06/2008  . LACUNAR INFARCTION 02/06/2008  . COLONIC POLYPS 10/13/2007  . Migraine without aura 09/27/2007  . Esophageal reflux 09/27/2007  . HERNIATED DISC 09/27/2007    Heinrich Fertig L 12/16/2018, 12:18 PM  [facility] [address] [place], [place], [address] Phone: [phone]   Fax:  [phone]  Name: [name] [name] MRN: [id number] Date of Birth: October 21, 1936  Geoffry Paradise, PT,DPT 12/16/18 12:18 PM Phone: [phone] Fax: [phone]

## 2018-12-22 ENCOUNTER — Ambulatory Visit: Payer: Medicare HMO

## 2018-12-22 DIAGNOSIS — R2689 Other abnormalities of gait and mobility: Secondary | ICD-10-CM

## 2018-12-22 DIAGNOSIS — R42 Dizziness and giddiness: Secondary | ICD-10-CM | POA: Diagnosis not present

## 2018-12-22 DIAGNOSIS — H8113 Benign paroxysmal vertigo, bilateral: Secondary | ICD-10-CM | POA: Diagnosis not present

## 2018-12-22 NOTE — Patient Instructions (Signed)
   Start sitting upright on sofa, look over left shoulder and then lie down on right side. Hold until dizziness stops PLUS 60 seconds. Then sit up quickly and lie down on left with forehead on sofa, hold until dizziness stops PLUS 30-60 seconds. Then sit upright. Repeat this whole process 3-5 times. Perform once a day.

## 2018-12-22 NOTE — Therapy (Addendum)
[facility] [address] [place], [place], [address] Phone: [phone]   Fax:  [phone]  Physical Therapy Treatment  Patient Details  Name: [name] [name] MRN: [id number] Date of Birth: 09/27/1936 Referring Provider (PT): Dr. Brigitte Pulse   Encounter Date: 12/22/2018  PT End of Session - 12/22/18 1241    Visit Number  3    Number of Visits  9    Date for PT Re-Evaluation  01/31/19    Authorization Type  [employer]    PT Start Time  [phone]    PT Stop Time  1018    PT Time Calculation (min)  44 min    Equipment Utilized During Treatment  --   min guard to S prn   Activity Tolerance  Patient tolerated treatment well    Behavior During Therapy  [facility] for tasks assessed/performed       Past Medical History:  Diagnosis Date  . Acid reflux disease   . Anemia    history of  . Arthritis    both hands  . Blood transfusion without reported diagnosis   . Cataract    bilateral cateracts removed  . Common migraine   . Diverticulosis    patient denies  . Esophageal stricture   . Herniated disc   . Hiatal hernia 2009   EGD-Small   . History of colonic polyps   . Hyperlipidemia   . Lacunar infarction ([place])   . Macular degeneration   . Sleep apnea    has c-pap but does not wear    Past Surgical History:  Procedure Laterality Date  . CHOLECYSTECTOMY     4-5 YRS AGO.  Marland Kitchen COLONOSCOPY    . ESOPHAGEAL DILATION    . EYE SURGERY     BILATERAL CATARAT  . INGUINAL HERNIA REPAIR     rt.  Marland Kitchen RECTOCELE REPAIR N/A 02/13/2013   Procedure: POSTERIOR REPAIR (RECTOCELE);  Surgeon: Emily Filbert, MD;  Location: [facility];  Service: Gynecology;  Laterality: N/A;  . ROBOTIC ASSISTED LAPAROSCOPIC LYSIS OF ADHESION  02/13/2016   Procedure: ROBOTIC ASSISTED LAPAROSCOPIC LYSIS OF ADHESION;  Surgeon: Princess Bruins, MD;  Location: [facility];  Service: Gynecology;;  . ROBOTIC ASSISTED LAPAROSCOPIC SACROCOLPOPEXY N/A 02/13/2016   Procedure:  ROBOTIC ASSISTED LAPAROSCOPIC SACROCOLPOPEXY With Theola Sequin;  Surgeon: Princess Bruins, MD;  Location: [facility];  Service: Gynecology;  Laterality: N/A;  . TOTAL ABDOMINAL HYSTERECTOMY    . UPPER GASTROINTESTINAL ENDOSCOPY      There were no vitals filed for this visit.  Subjective Assessment - 12/22/18 0937    Subjective  Pt amb. back to gym without RW and no back brace donned. Pt did plop into a chair, as it was further down than she thought. Pt reported she feels back pain is getting better. Pt reported dizziness has been "pretty good" since last visit. Pt reported some dizziness when getting up from supine the last two mornings.     Patient is accompained by:  Family member    Pertinent History  Macular degeneration, hx of cataracts (B extracted), Chronic daily HA, OSA on CPAP (but pt reports she does not use CPAP because it falls off and she doesn't like it), dysphagia, DJD, spondylisthesis of L3 on L4 and L4 on L5, HLD    Patient Stated Goals  To get dizziness to cease.     Currently in Pain?  Yes    Pain Score  7     Pain Location  Flank    Pain Orientation  Right    Pain Descriptors / Indicators  Aching    Pain Onset  More than a month ago    Pain Frequency  Constant    Aggravating Factors   movement    Pain Relieving Factors  lying down             Vestibular Assessment - 12/22/18 0943      Positional Testing   Sidelying Test  Sidelying Right      Sidelying Right   Sidelying Right Duration  < 10 sec. with 2-3/10 dizziness    Sidelying Right Symptoms  Upbeat, right rotatory nystagmus                Vestibular Treatment/Exercise - 12/22/18 0949      Vestibular Treatment/Exercise   Vestibular Treatment Provided  Canalith Repositioning    Canalith Repositioning  Semont Procedure Right Posterior      Semont Procedure Right Posterior   Number of Reps   4    Overall Response   Symptoms Resolved    Response Details   Pt reported dizziness decr. after  each session (< 5 sec. of dizziness, then 1-2 beats of nystagmus), then no nystagmus.          Start sitting upright on sofa, look over left shoulder and then lie down on right side. Hold until dizziness stops PLUS 60 seconds. Then sit up quickly and lie down on left with forehead on sofa, hold until dizziness stops PLUS 30-60 seconds. Then sit upright. Repeat this whole process 3-5 times. Perform once a day. Provided as HEP, PT instructed pt and spouse how to perform safely at home.      Self Care: PT Education - 12/22/18 1239    Education Details  PT discussed positional testing findings and instructed pt and husband how to perform Semont procedure at home. PT educated pt on the importance of using RW at all times to ensure safety during amb, as pt required HHA to amb. back to exam room. PT encouraged pt to notify MD if back pain gets worse without back brace donned, but pt reported pain is improving. PT educated pt on the importance of not lying in bed all day.     Person(s) Educated  Patient;Spouse    Methods  Explanation;Demonstration;Verbal cues;Handout    Comprehension  Returned demonstration;Verbalized understanding       PT Short Term Goals - 12/02/18 1230      PT SHORT TERM GOAL #1   Title  same as LTGs        PT Long Term Goals - 12/16/18 1215      PT LONG TERM GOAL #1   Title  Perform positional testing and treat as indicated, once back precautions verified. TARGET DATE FOR ALL LTGS: 12/30/18    Status  Achieved      PT LONG TERM GOAL #2   Title  Perform DGI and write goal as indicated.     Status  New      PT LONG TERM GOAL #3   Title  Pt will amb. with LRAD to >/=2.60ft/sec to safely amb. in the community.     Status  New      PT LONG TERM GOAL #4   Title  Pt will amb. 500' over even/uneven terrain with LRAD at MOD I level to improve functional mobility.     Status  New      PT LONG TERM GOAL #  5   Title  Pt will be IND with HEP in order to improve dizziness,  strength, and balance.     Status  New      Additional Long Term Goals   Additional Long Term Goals  Yes      PT LONG TERM GOAL #6   Title  Pt will report 0/10 dizziness during all positional testing to improve QOL and safety.     Baseline  7-8/10 dizziness during R sidelying positional testing    Status  New            Plan - 12/22/18 1241    Clinical Impression Statement  Pt demonstrated progress, as pt's dizziness has improve but she continues to experience dizziness and R upbeating torsional nystagmus during positional testing, consistent with R pBPPV. Pt with improved s/s after treatment, therefore, PT educated pt and spouse how to perform Semont (at slower speed) at home. PT will formally assess balance next session as tolerated. Pt would continue to benefit from skilled PT to improve safety during functional mobility.     Rehab Potential  Good    Clinical Impairments Affecting Rehab Potential  please see above.     PT Frequency  2x / week    PT Duration  4 weeks    PT Treatment/Interventions  ADLs/Self Care Home Management;Biofeedback;Canalith Repostioning;Functional mobility training;Gait training;Stair training;Patient/family education;Orthotic Fit/Training;DME Instruction;Neuromuscular re-education;Balance training;Therapeutic exercise;Therapeutic activities;Manual techniques;Vestibular   need to verify back precautions   PT Next Visit Plan  Continue to treat R pBPPV with semont as indicated. Perform DGI and write goals as indicated.     Consulted and Agree with Plan of Care  Patient;Family member/caregiver    Family Member Consulted  Jim: husband       Patient will benefit from skilled therapeutic intervention in order to improve the following deficits and impairments:  Abnormal gait, Impaired flexibility, Decreased balance, Decreased mobility, Postural dysfunction, Dizziness, Decreased endurance, Decreased knowledge of precautions, Decreased knowledge of use of DME,  Decreased range of motion, Decreased strength  Visit Diagnosis: BPPV (benign paroxysmal positional vertigo), bilateral  Dizziness and giddiness  Other abnormalities of gait and mobility     Problem List Patient Active Problem List   Diagnosis Date Noted  . Postoperative state 02/13/2016  . Chest pain with moderate risk of acute coronary syndrome 10/08/2014  . Sepsis syndrome 06/26/2013  . Acute pyelonephritis 06/26/2013  . Migraine 03/28/2013  . Rectocele 02/13/2013  . Sleep apnea 02/16/2012  . Chronic daily headache 01/12/2012  . OSTEOPENIA 10/17/2008  . ESOPHAGEAL STRICTURE 05/02/2008  . DIVERTICULOSIS-COLON 05/02/2008  . DIVERTICULITIS-COLON 05/02/2008  . ABDOMINAL PAIN-EPIGASTRIC 05/02/2008  . GASTRITIS, ACUTE 03/27/2008  . LEG CRAMPS, NOCTURNAL 02/10/2008  . Headache(784.0) 02/10/2008  . OTHER DYSPHAGIA 02/10/2008  . HYPERLIPIDEMIA 02/06/2008  . LACUNAR INFARCTION 02/06/2008  . COLONIC POLYPS 10/13/2007  . Migraine without aura 09/27/2007  . Esophageal reflux 09/27/2007  . HERNIATED DISC 09/27/2007    Maycee Blasco L 12/22/2018, 12:46 PM  [facility] [address] [place], [place], [address] Phone: [phone]   Fax:  [phone]  Name: [name] [name] MRN: [id number] Date of Birth: 11-15-1936  Geoffry Paradise, PT,DPT 12/22/18 12:46 PM Phone: [phone] Fax: [phone]

## 2018-12-23 DIAGNOSIS — E781 Pure hyperglyceridemia: Secondary | ICD-10-CM | POA: Diagnosis not present

## 2018-12-27 ENCOUNTER — Ambulatory Visit: Payer: Medicare HMO | Admitting: Physical Therapy

## 2018-12-27 DIAGNOSIS — H8113 Benign paroxysmal vertigo, bilateral: Secondary | ICD-10-CM

## 2018-12-27 DIAGNOSIS — R2689 Other abnormalities of gait and mobility: Secondary | ICD-10-CM | POA: Diagnosis not present

## 2018-12-27 DIAGNOSIS — R42 Dizziness and giddiness: Secondary | ICD-10-CM

## 2018-12-28 NOTE — Therapy (Signed)
[facility] [address] [place], [place], [address] Phone: [phone]   Fax:  [phone]  Physical Therapy Treatment  Patient Details  Name: [name] [name] MRN: [id number] Date of Birth: December 15, 1935 Referring Provider (PT): Dr. Brigitte Pulse   Encounter Date: 12/27/2018  PT End of Session - 12/28/18 2020    Visit Number  4    Number of Visits  9    Date for PT Re-Evaluation  01/31/19    Authorization Type  [employer]    PT Start Time  1104    PT Stop Time  1146    PT Time Calculation (min)  42 min       Past Medical History:  Diagnosis Date  . Acid reflux disease   . Anemia    history of  . Arthritis    both hands  . Blood transfusion without reported diagnosis   . Cataract    bilateral cateracts removed  . Common migraine   . Diverticulosis    patient denies  . Esophageal stricture   . Herniated disc   . Hiatal hernia 2009   EGD-Small   . History of colonic polyps   . Hyperlipidemia   . Lacunar infarction ([facility])   . Macular degeneration   . Sleep apnea    has c-pap but does not wear    Past Surgical History:  Procedure Laterality Date  . CHOLECYSTECTOMY     4-5 YRS AGO.  Marland Kitchen COLONOSCOPY    . ESOPHAGEAL DILATION    . EYE SURGERY     BILATERAL CATARAT  . INGUINAL HERNIA REPAIR     rt.  Marland Kitchen RECTOCELE REPAIR N/A 02/13/2013   Procedure: POSTERIOR REPAIR (RECTOCELE);  Surgeon: Emily Filbert, MD;  Location: [facility];  Service: Gynecology;  Laterality: N/A;  . ROBOTIC ASSISTED LAPAROSCOPIC LYSIS OF ADHESION  02/13/2016   Procedure: ROBOTIC ASSISTED LAPAROSCOPIC LYSIS OF ADHESION;  Surgeon: Princess Bruins, MD;  Location: [facility];  Service: Gynecology;;  . ROBOTIC ASSISTED LAPAROSCOPIC SACROCOLPOPEXY N/A 02/13/2016   Procedure: ROBOTIC ASSISTED LAPAROSCOPIC SACROCOLPOPEXY With Theola Sequin;  Surgeon: Princess Bruins, MD;  Location: [facility];  Service: Gynecology;  Laterality: N/A;  . TOTAL ABDOMINAL  HYSTERECTOMY    . UPPER GASTROINTESTINAL ENDOSCOPY      There were no vitals filed for this visit.  Subjective Assessment - 12/28/18 2016    Subjective  Pt reports she had some dizziness yesterday when she did the Semont maneuver - had dizziness when she got up from left sidelying position; pt using RW today    Patient is accompained by:  Family member    Pertinent History  Macular degeneration, hx of cataracts (B extracted), Chronic daily HA, OSA on CPAP (but pt reports she does not use CPAP because it falls off and she doesn't like it), dysphagia, DJD, spondylisthesis of L3 on L4 and L4 on L5, HLD    Patient Stated Goals  To get dizziness to cease.              Neuro Re-ed:  Modified semont maneuver - pt performed sitting to Rt sidelying to Lt sidelying to Rt sidelying - 5 reps with 1" wait time After each sidelying position; Rt rotary nystagmus only observed on initial rep in Rt sidelying position          [facility] Adult PT Treatment/Exercise - 12/28/18 0001      Dynamic Gait Index   Level Surface  Mild Impairment  Change in Gait Speed  Moderate Impairment    Gait with Horizontal Head Turns  Mild Impairment    Gait with Vertical Head Turns  Mild Impairment    Gait and Pivot Turn  Mild Impairment    Step Over Obstacle  Mild Impairment    Step Around Obstacles  Normal    Steps  Mild Impairment    Total Score  16               PT Short Term Goals - 12/02/18 1230      PT SHORT TERM GOAL #1   Title  same as LTGs        PT Long Term Goals - 12/16/18 1215      PT LONG TERM GOAL #1   Title  Perform positional testing and treat as indicated, once back precautions verified. TARGET DATE FOR ALL LTGS: 12/30/18    Status  Achieved      PT LONG TERM GOAL #2   Title  Perform DGI and write goal as indicated.     Status  New      PT LONG TERM GOAL #3   Title  Pt will amb. with LRAD to >/=2.30ft/sec to safely amb. in the community.     Status  New      PT  LONG TERM GOAL #4   Title  Pt will amb. 500' over even/uneven terrain with LRAD at MOD I level to improve functional mobility.     Status  New      PT LONG TERM GOAL #5   Title  Pt will be IND with HEP in order to improve dizziness, strength, and balance.     Status  New      Additional Long Term Goals   Additional Long Term Goals  Yes      PT LONG TERM GOAL #6   Title  Pt will report 0/10 dizziness during all positional testing to improve QOL and safety.     Baseline  7-8/10 dizziness during R sidelying positional testing    Status  New            Plan - 12/28/18 2021    Clinical Impression Statement  Rt rotary upbeating nystagmus was noted on first rep of sit to Rt sidelying with minimal c/o vertigo reported.  Pt performed sit to sidelying (modified Semont as pt has decreased speed with sit to sidelying) 5 reps to each side with no c/o spinning vertigo reported on reps 2-5.     Rehab Potential  Good    PT Frequency  2x / week    PT Duration  4 weeks    PT Treatment/Interventions  ADLs/Self Care Home Management;Biofeedback;Canalith Repostioning;Functional mobility training;Gait training;Stair training;Patient/family education;Orthotic Fit/Training;DME Instruction;Neuromuscular re-education;Balance training;Therapeutic exercise;Therapeutic activities;Manual techniques;Vestibular    PT Next Visit Plan  Continue to treat R pBPPV with semont as indicated. Perform DGI and write goals as indicated.     Consulted and Agree with Plan of Care  Patient;Family member/caregiver    Family Member Consulted  Jim: husband       Patient will benefit from skilled therapeutic intervention in order to improve the following deficits and impairments:  Abnormal gait, Impaired flexibility, Decreased balance, Decreased mobility, Postural dysfunction, Dizziness, Decreased endurance, Decreased knowledge of precautions, Decreased knowledge of use of DME, Decreased range of motion, Decreased strength  Visit  Diagnosis: BPPV (benign paroxysmal positional vertigo), bilateral  Dizziness and giddiness  Other abnormalities of gait and mobility  Problem List Patient Active Problem List   Diagnosis Date Noted  . Postoperative state 02/13/2016  . Chest pain with moderate risk of acute coronary syndrome 10/08/2014  . Sepsis syndrome 06/26/2013  . Acute pyelonephritis 06/26/2013  . Migraine 03/28/2013  . Rectocele 02/13/2013  . Sleep apnea 02/16/2012  . Chronic daily headache 01/12/2012  . OSTEOPENIA 10/17/2008  . ESOPHAGEAL STRICTURE 05/02/2008  . DIVERTICULOSIS-COLON 05/02/2008  . DIVERTICULITIS-COLON 05/02/2008  . ABDOMINAL PAIN-EPIGASTRIC 05/02/2008  . GASTRITIS, ACUTE 03/27/2008  . LEG CRAMPS, NOCTURNAL 02/10/2008  . Headache(784.0) 02/10/2008  . OTHER DYSPHAGIA 02/10/2008  . HYPERLIPIDEMIA 02/06/2008  . LACUNAR INFARCTION 02/06/2008  . COLONIC POLYPS 10/13/2007  . Migraine without aura 09/27/2007  . Esophageal reflux 09/27/2007  . HERNIATED DISC 09/27/2007    Alda Lea, PT 12/28/2018, 8:27 PM  [facility] [address] [place], [place], [address] Phone: [phone]   Fax:  [phone]  Name: [name] [name] MRN: [id number] Date of Birth: [birth year]

## 2018-12-30 DIAGNOSIS — S22009A Unspecified fracture of unspecified thoracic vertebra, initial encounter for closed fracture: Secondary | ICD-10-CM | POA: Diagnosis not present

## 2018-12-30 DIAGNOSIS — R030 Elevated blood-pressure reading, without diagnosis of hypertension: Secondary | ICD-10-CM | POA: Diagnosis not present

## 2018-12-30 DIAGNOSIS — Z1331 Encounter for screening for depression: Secondary | ICD-10-CM | POA: Diagnosis not present

## 2018-12-30 DIAGNOSIS — K219 Gastro-esophageal reflux disease without esophagitis: Secondary | ICD-10-CM | POA: Diagnosis not present

## 2018-12-30 DIAGNOSIS — I700 Atherosclerosis of aorta: Secondary | ICD-10-CM | POA: Diagnosis not present

## 2018-12-30 DIAGNOSIS — R82998 Other abnormal findings in urine: Secondary | ICD-10-CM | POA: Diagnosis not present

## 2018-12-30 DIAGNOSIS — R7301 Impaired fasting glucose: Secondary | ICD-10-CM | POA: Diagnosis not present

## 2018-12-30 DIAGNOSIS — Z0000 Encounter for general adult medical examination without abnormal findings: Secondary | ICD-10-CM | POA: Diagnosis not present

## 2018-12-30 DIAGNOSIS — E781 Pure hyperglyceridemia: Secondary | ICD-10-CM | POA: Diagnosis not present

## 2019-01-02 ENCOUNTER — Ambulatory Visit: Payer: Medicare HMO | Admitting: Physical Therapy

## 2019-01-02 ENCOUNTER — Other Ambulatory Visit: Payer: Self-pay | Admitting: Internal Medicine

## 2019-01-02 DIAGNOSIS — M5489 Other dorsalgia: Secondary | ICD-10-CM

## 2019-01-02 DIAGNOSIS — M4854XS Collapsed vertebra, not elsewhere classified, thoracic region, sequela of fracture: Secondary | ICD-10-CM

## 2019-01-05 ENCOUNTER — Ambulatory Visit: Payer: Medicare HMO

## 2019-01-06 DIAGNOSIS — M545 Low back pain: Secondary | ICD-10-CM | POA: Diagnosis not present

## 2019-01-06 DIAGNOSIS — M25552 Pain in left hip: Secondary | ICD-10-CM | POA: Diagnosis not present

## 2019-01-06 DIAGNOSIS — Z6820 Body mass index (BMI) 20.0-20.9, adult: Secondary | ICD-10-CM | POA: Diagnosis not present

## 2019-01-10 DIAGNOSIS — M8589 Other specified disorders of bone density and structure, multiple sites: Secondary | ICD-10-CM | POA: Diagnosis not present

## 2019-01-10 DIAGNOSIS — Z13820 Encounter for screening for osteoporosis: Secondary | ICD-10-CM | POA: Diagnosis not present

## 2019-01-19 ENCOUNTER — Other Ambulatory Visit: Payer: Self-pay | Admitting: Internal Medicine

## 2019-01-19 ENCOUNTER — Ambulatory Visit
Admission: RE | Admit: 2019-01-19 | Discharge: 2019-01-19 | Disposition: A | Discharge status: Home / Self Care | Payer: Medicare HMO | Source: Ambulatory Visit | Attending: Internal Medicine | Admitting: Internal Medicine

## 2019-01-19 DIAGNOSIS — M5489 Other dorsalgia: Secondary | ICD-10-CM

## 2019-01-19 DIAGNOSIS — C7951 Secondary malignant neoplasm of bone: Secondary | ICD-10-CM

## 2019-01-19 DIAGNOSIS — S22080A Wedge compression fracture of T11-T12 vertebra, initial encounter for closed fracture: Secondary | ICD-10-CM | POA: Diagnosis not present

## 2019-01-19 DIAGNOSIS — M4854XS Collapsed vertebra, not elsewhere classified, thoracic region, sequela of fracture: Secondary | ICD-10-CM

## 2019-01-20 ENCOUNTER — Ambulatory Visit
Admission: RE | Admit: 2019-01-20 | Discharge: 2019-01-20 | Disposition: A | Discharge status: Home / Self Care | Payer: Medicare HMO | Source: Ambulatory Visit | Attending: Internal Medicine | Admitting: Internal Medicine

## 2019-01-20 DIAGNOSIS — J984 Other disorders of lung: Secondary | ICD-10-CM | POA: Diagnosis not present

## 2019-01-20 DIAGNOSIS — K5730 Diverticulosis of large intestine without perforation or abscess without bleeding: Secondary | ICD-10-CM | POA: Diagnosis not present

## 2019-01-20 DIAGNOSIS — C7951 Secondary malignant neoplasm of bone: Secondary | ICD-10-CM

## 2019-01-20 MED ORDER — IOPAMIDOL (ISOVUE-300) INJECTION 61%
100.0000 mL | Freq: Once | INTRAVENOUS | Status: AC | PRN
  Administered 2019-01-20: 100 mL via INTRAVENOUS

## 2019-01-24 DIAGNOSIS — S22000A Wedge compression fracture of unspecified thoracic vertebra, initial encounter for closed fracture: Secondary | ICD-10-CM | POA: Diagnosis not present

## 2019-01-25 DIAGNOSIS — S22009A Unspecified fracture of unspecified thoracic vertebra, initial encounter for closed fracture: Secondary | ICD-10-CM | POA: Diagnosis not present

## 2019-01-26 ENCOUNTER — Other Ambulatory Visit: Payer: Self-pay | Admitting: Internal Medicine

## 2019-01-26 ENCOUNTER — Other Ambulatory Visit (HOSPITAL_COMMUNITY): Payer: Self-pay | Admitting: Internal Medicine

## 2019-01-26 DIAGNOSIS — S22009A Unspecified fracture of unspecified thoracic vertebra, initial encounter for closed fracture: Secondary | ICD-10-CM

## 2019-01-26 DIAGNOSIS — C7951 Secondary malignant neoplasm of bone: Secondary | ICD-10-CM | POA: Diagnosis not present

## 2019-02-01 DIAGNOSIS — M545 Low back pain: Secondary | ICD-10-CM | POA: Diagnosis not present

## 2019-02-01 DIAGNOSIS — M810 Age-related osteoporosis without current pathological fracture: Secondary | ICD-10-CM | POA: Diagnosis not present

## 2019-03-08 DIAGNOSIS — R630 Anorexia: Secondary | ICD-10-CM | POA: Diagnosis not present

## 2019-03-08 DIAGNOSIS — K5909 Other constipation: Secondary | ICD-10-CM | POA: Diagnosis not present

## 2019-03-08 DIAGNOSIS — M546 Pain in thoracic spine: Secondary | ICD-10-CM | POA: Diagnosis not present

## 2019-03-08 DIAGNOSIS — M810 Age-related osteoporosis without current pathological fracture: Secondary | ICD-10-CM | POA: Diagnosis not present

## 2019-03-20 DIAGNOSIS — D72829 Elevated white blood cell count, unspecified: Secondary | ICD-10-CM | POA: Diagnosis not present

## 2019-03-20 DIAGNOSIS — F419 Anxiety disorder, unspecified: Secondary | ICD-10-CM | POA: Diagnosis not present

## 2019-03-20 DIAGNOSIS — Z20818 Contact with and (suspected) exposure to other bacterial communicable diseases: Secondary | ICD-10-CM | POA: Diagnosis not present

## 2019-03-20 DIAGNOSIS — R05 Cough: Secondary | ICD-10-CM | POA: Diagnosis not present

## 2019-03-20 DIAGNOSIS — R072 Precordial pain: Secondary | ICD-10-CM | POA: Diagnosis not present

## 2019-03-22 ENCOUNTER — Encounter (HOSPITAL_COMMUNITY): Payer: Self-pay

## 2019-03-22 NOTE — Progress Notes (Signed)
Received referral from Reginold Agent, NP at [facility] for echocardiogram. After speaking with our DOD (Dr. Tamala Julian), he did not feel this echo was deemed urgent and should be deferred until after restriction eases. [facility] will be notified to call back if any new clinical changes.

## 2019-03-23 ENCOUNTER — Other Ambulatory Visit (HOSPITAL_COMMUNITY): Payer: Self-pay | Admitting: Adult Health

## 2019-03-23 DIAGNOSIS — R072 Precordial pain: Secondary | ICD-10-CM

## 2019-03-23 DIAGNOSIS — R05 Cough: Secondary | ICD-10-CM

## 2019-03-23 DIAGNOSIS — R059 Cough, unspecified: Secondary | ICD-10-CM

## 2019-03-24 ENCOUNTER — Other Ambulatory Visit: Payer: Self-pay

## 2019-03-24 ENCOUNTER — Ambulatory Visit (HOSPITAL_COMMUNITY): Payer: Medicare HMO | Attending: Cardiology

## 2019-03-24 DIAGNOSIS — R059 Cough, unspecified: Secondary | ICD-10-CM

## 2019-03-24 DIAGNOSIS — R05 Cough: Secondary | ICD-10-CM

## 2019-03-24 DIAGNOSIS — R072 Precordial pain: Secondary | ICD-10-CM | POA: Diagnosis not present

## 2019-04-03 ENCOUNTER — Other Ambulatory Visit: Payer: Self-pay | Admitting: Internal Medicine

## 2019-04-03 DIAGNOSIS — Z1231 Encounter for screening mammogram for malignant neoplasm of breast: Secondary | ICD-10-CM

## 2019-04-21 ENCOUNTER — Telehealth: Payer: Self-pay

## 2019-04-21 NOTE — Telephone Encounter (Signed)
Received return call from patient's husband to schedule visit with Palliative Care. Visit scheduled for Tuesday 04/25/2019 between 10:30am and 11:00 am. Covid screen completed- negative

## 2019-04-21 NOTE — Telephone Encounter (Signed)
Phone call placed to patient's husband to schedule visit with Palliative care. Husband requested a call back next week.

## 2019-04-25 ENCOUNTER — Encounter: Payer: Self-pay | Admitting: Internal Medicine

## 2019-04-25 ENCOUNTER — Other Ambulatory Visit: Payer: Medicare HMO | Admitting: Internal Medicine

## 2019-04-25 ENCOUNTER — Other Ambulatory Visit: Payer: Self-pay

## 2019-04-25 DIAGNOSIS — Z515 Encounter for palliative care: Secondary | ICD-10-CM | POA: Diagnosis not present

## 2019-04-25 NOTE — Progress Notes (Signed)
May 26th, 2020 [facility] Palliative Care Consult Note Telephone: [phone]  Fax: [phone]  PATIENT NAME: [name] [name] DOB: 1936-06-26 MRN: [id number] Home: [place], [place] [address]  PRIMARY CARE PROVIDER:   Marton Redwood, MD  Dr. Scarlette Shorts (GI)  REFERRING PROVIDER:  Marton Redwood, MD [address] [place], [place] [address]  RESPONSIBLE PARTY:   Patients husband Chellsie Gomer [phone] Son [name] (oral surgeon) [phone].   RECOMMENDATIONS and PLAN:  1. Decreased function r/t comorbid conditions and deconditioning: A & O x 3, but admits to feeling fuzzy headed, loosing train of thought, and feeling sleepy headed which she attributes to her opioids. Patient is legally blind, but I observed her ability to maneuver about the apartment safely. Patient is independent in transfers; ambulates with consistent use of her walker. Independent in hygiene and dressing. Her current weight is 107 lbs, which is a loss of 27 lbs (20% of body weight) over the last 7 months. At 5'4" her BMI is 18.4 kg/m2. She feels hungry then loses her appetite. Consumes small meals. In the past would consume up to 3 ensure / day but discontinued d/t worry that it would contribute to constipation. She moves her bowels every few days. We discussed use of prn MOM, increase of dietary fruit, and increase in oral fluids to keep her bowel movements soft and regular. Patient has had a past issue with being overweight, and her husband [name] worries that this plays a role now in her decreased oral consumption. Patient notes gradual progressive increase in sensation that coarser textured food such as chicken can become "stuck" in her lower esophagus. She has had esophageal dilations in the past. She plans to follow up with GI Dr. Henrene Pastor. Patient is taking prn Tums and a  PPI for symptoms of GERD. Problems with lower back pain managed with MSO4 15mg  bid. [name] was  surprised that her MSO4 was not ER; believes it was ordered that way in the past and that patient showed less side effects (confusion/sleepiness/constipation) on the ER formulation. Jim plans to f/u with PCP to request change from IR to ER release.   2. Family supports: Resides in first floor level condo with husband who is in good health, and is available 24/7 to assist with care. Children live close by and are supportive. They have been observing strict COVID restrictions and coping well with these.  3. Goals of Care: To decrease level of confusion/sleepiness, increased strength and mobility, increase weight or at least to prevent further loss, and to feel better. Feels like she was healthy, well, and doing fine till she "hit a brick wall" last fall, when admitted and treated for pneumonia, then subsequently suffering a vertebal compression fracture. -Increase Ensure to tid, as tolerated. Consider addition of an oral appetite stimulate (megace/remeron./marinol) if no appreciable weight gain. -trial substitution of tylenol/advil combination instead of MSO4. Spouse [name] is aware of studies that show efficacy of this combination even in preference to opioids, in those with chronic back pain. We discussed limiting Tylenol to 2-3 grams a day, and Advil to 400-800 mg/day (on the lower dosage side if taking consistently). Patient admits that her back pain is improved when she is more mobile, and with past home PT. I reviewed and left some exercises with them, and encouraged patient to initiate 10 sets of each of the 5, qd with progression to tid as tolerated. Walking as much as able  is to be encouraged.  4. Advance Care Directives: We discussed DNR and MOST form in some detail, and written patient educational material reviewed with patient and her spouse, and left in the home. I encouraged patient and spouse to discuss their wishes with their children. At this time, patient wishes full scope of medical care, but  acknowledges this may be a moving target, depending on her changes in her underlying health status.  5. NP Palliative Care follow up: Patient requests that I call the week of June 7th in follow up: response to opioid wean, monitor weight loss/gain, constipation, and possible completion of MOST form.  I spent 90 minutes providing this consultation,  From 10:30 am to noon. More than 50% of the time in this consultation was spent coordinating communication.   HISTORY OF PRESENT ILLNESS:  [name] [name] is a 83 y.o.  female with h/o BPPV (benign paroxysmal positional vertigo) bilateral, GERD (omeprazole), anemia, arthritis (hands), migraine (chronic daily), esophageal stricture (dilation x 2), diverticulosis, herniated disc, hiatal hernia, HLD, Lacunar infarction, macular degeneration (legally blind) sleep apnea (c-pap doesn't wear), and DJD (spondylisthesis L3-L5; compression rx T12 Nov 2019). Patient is s/p OP rehab [facility] (Jan 2020).  Palliative Care was asked to help address goals of care.   CODE STATUS: Full; ongoing discussions MOST form  PPS: 60% HOSPICE ELIGIBILITY/DIAGNOSIS: no; current prognosis though to be greater than 6 months, though would watch this progressive/unexplained weight loss.  PAST MEDICAL HISTORY:  Past Medical History:  Diagnosis Date   Acid reflux disease    Anemia    history of   Arthritis    both hands   Blood transfusion without reported diagnosis    Cataract    bilateral cateracts removed   Common migraine    Diverticulosis    patient denies   Esophageal stricture    Herniated disc    Hiatal hernia 2009   EGD-Small    History of colonic polyps    Hyperlipidemia    Lacunar infarction ([facility])    Macular degeneration    Sleep apnea    has c-pap but does not wear    SOCIAL HX:  Social History   Tobacco Use   Smoking status: Former Smoker    Years: 10.00    Types: Cigarettes   Smokeless tobacco: Never Used    Substance Use Topics   Alcohol use: No    ALLERGIES:  Allergies  Allergen Reactions   Isometheptene-Dichloral-Apap Nausea Only    (midrin)   Oxycodone Hcl Nausea And Vomiting    Pt reports tolerating vicodin     PERTINENT MEDICATIONS:  Outpatient Encounter Medications as of 04/25/2019  Medication Sig   bisacodyl (DULCOLAX) 5 MG EC tablet Take 2 tablets (10 mg total) by mouth daily as needed for moderate constipation. (Patient not taking: Reported on 12/02/2018)   Calcium Carb-Cholecalciferol (CALCIUM 1000 + D PO) Take 3 g by mouth daily.   gabapentin (NEURONTIN) 300 MG capsule Take 1 capsule by mouth at bedtime.   Methylsulfonylmethane (MSM) 1000 MG CAPS Take 1 capsule by mouth 3 (three) times daily.   Multiple Vitamins-Minerals (CENTRUM SILVER ADULT 50+) TABS Take 1 tablet by mouth every morning. Reported on 12/12/2015   omeprazole (PRILOSEC) 20 MG capsule Take 1 tablet by mouth every morning.   polyethylene glycol (MIRALAX / GLYCOLAX) packet Take 17 g by mouth 2 (two) times daily as needed.   traMADol (ULTRAM) 50 MG tablet Take 1 tablet (50 mg total) by mouth  every 8 (eight) hours as needed. (Patient not taking: Reported on 12/02/2018)   No facility-administered encounter medications on file as of 04/25/2019.     PHYSICAL EXAM:   General: NAD, frail appearing, thin. Husband [name] in attendance Cardiovascular: regular rate and rhythm Pulmonary: clear ant fields Abdomen: soft, nontender, + bowel sounds GU: no suprapubic tenderness Extremities: no edema, no joint deformities Skin: no rashes Neurological: Weakness but otherwise nonfocal  Julianne Handler, NP

## 2019-05-26 ENCOUNTER — Ambulatory Visit: Payer: Medicare HMO

## 2019-05-30 ENCOUNTER — Telehealth: Payer: Self-pay | Admitting: Internal Medicine

## 2019-05-30 NOTE — Telephone Encounter (Signed)
5pm: TC to patient's spouse [name] [name] [phone]. Left message on voice mail to scheule f/u PC visit. I left my contact information.  Violeta Gelinas NP-C [phone]

## 2019-06-01 ENCOUNTER — Other Ambulatory Visit: Payer: Medicare HMO | Admitting: Internal Medicine

## 2019-06-01 ENCOUNTER — Other Ambulatory Visit: Payer: Self-pay

## 2019-06-01 ENCOUNTER — Encounter: Payer: Self-pay | Admitting: Internal Medicine

## 2019-06-01 DIAGNOSIS — Z515 Encounter for palliative care: Secondary | ICD-10-CM

## 2019-06-01 NOTE — Progress Notes (Signed)
July 2nd, 2020 [facility] Note Telephone: [phone]  Fax: [phone]   PATIENT NAME: [name] [name] DOB: Jan 12, 1936 MRN: [id number] Home: [place], [place] [address]   PRIMARY CARE PROVIDER:   Marton Redwood, MD  Dr. Scarlette [name] (GI)   REFERRING PROVIDER:  Marton Redwood, MD [place], [place] [address]   RESPONSIBLE PARTY:   Patient's husband [name] [name] [phone] Son [name] [name] (oral surgeon) [phone].   RECOMMENDATIONS and PLAN:  1. Decreased function r/t comorbid conditions and deconditioning:  Patient has weaned herself off her opioids, that were initially prescribed for pain control of acute vertebral compression fractures. She finds good pain control on Tylenol 1-2 tabs bid, with rare use of ibuprofen. Happily, opioid side effects of fatigue / somnolence, constipation, shakiness, hallucinations, and mild confusion, have all pretty much resolved. Her appetite has improved, though her weight is stable at 106 lbs. She continues Ensure qd; increasing more than one a day seems to cause associated constipation for her. At 5'4" her BMI is 18.4 kg/m2.  She has buffed up her exercise regimen to walking 2 miles qod. Her husband thinks she would do better with a rollator walker (rather than the traditional 2 wheeled walker) because of the hand brakes. The standard walker tends to get away from her on hills, and she rushes to catch up. She has had one fall without any associated symptoms. Husband [name] [name] thinks she got tangled up in her walker. She continues with symptoms of heart burn (epigastric burning), improved on Protonix 40mg  bid but still bothersome. Exacerbated by spicy or tomato-based foods. She has a virtual visit with her gastroenterologist in a week or two. Episodic constipation managed with prn MiraLAX and Colace.  She has a burning sensation left of lower sternum which is improved when she  presses down on that area. Not exacerbated with activity; no other associated symptoms worrisome for cardiac etiology.   -Added OTC Zantac 150mg  po bid to regimen; otherwise as per Dr. Henrene Pastor.   2. Family supports: Resides in first floor level condo with husband who is in good health and is available 24/7 to assist with care. Children live close by and are supportive. They have been observing strict COVID restrictions and coping well with these.  3. Advance Care Directives: Patient presented me with a completed MOST document. Details: DNR/DNI. Limited scope of medical intervention. Yes to antibiotics/IVFs. No to tube feedings. We again reviewed the individual components. I uploaded into [facility] EMR ([facility]) and left 2 originals with patient. We discussed common procedures associated with funeral services, cremation vs burial, logistic of obtaining a death certificate, etc.   4. NP Palliative Care follow up:  Aug 6th at 11 am.    I spent 60 minutes providing this consultation from noon to 1pm. More than 50% of the time in this consultation was spent coordinating communication.    HISTORY OF PRESENT ILLNESS:  [name] [name] is a 83 y.o. female with h/o BPPV (benign paroxysmal positional vertigo) bilateral, GERD, anemia, arthritis (hands), migraine (chronic daily), esophageal stricture (dilation x 2), diverticulosis, herniated disc, hiatal hernia, HLD, Lacunar infarction, macular degeneration (legally blind) sleep apnea (c-pap doesn't wear), and DJD (spondylisthesis L3-L5; compression rx T12 Nov 2019). Patient is s/p OP rehab [facility] (Jan 2020).  This is a f/u Palliative Care visit from May 26th.    CODE STATUS:  MOST form completed   PPS:  60-70%  HOSPICE ELIGIBILITY/DIAGNOSIS: no; current prognosis though to be greater than 6 months. PAST MEDICAL HISTORY:  Past Medical History:  Diagnosis Date  . Acid reflux disease   . Anemia    history of  . Arthritis    both hands  .  Blood transfusion without reported diagnosis   . Cataract    bilateral cateracts removed  . Common migraine   . Diverticulosis    patient denies  . Esophageal stricture   . Herniated disc   . Hiatal hernia 2009   EGD-Small   . History of colonic polyps   . Hyperlipidemia   . Lacunar infarction ([place])   . Macular degeneration   . Sleep apnea    has c-pap but does not wear    SOCIAL HX:  Social History   Tobacco Use  . Smoking status: Former Smoker    Years: 10.00    Types: Cigarettes  . Smokeless tobacco: Never Used  Substance Use Topics  . Alcohol use: No    ALLERGIES:  Allergies  Allergen Reactions  . Isometheptene-Dichloral-Apap Nausea Only    (midrin)  . Oxycodone Hcl Nausea And Vomiting    Pt reports tolerating vicodin     PERTINENT MEDICATIONS:  Outpatient Encounter Medications as of 06/01/2019  Medication Sig  . Abaloparatide (TYMLOS [place]) Inject into the skin daily.  Marland Kitchen acetaminophen (TYLENOL) 500 MG tablet Take 500 mg by mouth every 6 (six) hours as needed.  . docusate sodium (COLACE) 100 MG capsule Take 100 mg by mouth 2 (two) times daily as needed for mild constipation.  Marland Kitchen ibuprofen (ADVIL) 200 MG tablet Take 200 mg by mouth every 4 (four) hours as needed.  . pantoprazole (PROTONIX) 40 MG tablet Take 40 mg by mouth daily.  . polyethylene glycol (MIRALAX / GLYCOLAX) packet Take 17 g by mouth 2 (two) times daily as needed.  . ranitidine (ZANTAC) 150 MG capsule Take 150 mg by mouth 2 (two) times daily.  . [DISCONTINUED] bisacodyl (DULCOLAX) 5 MG EC tablet Take 2 tablets (10 mg total) by mouth daily as needed for moderate constipation. (Patient not taking: Reported on 12/02/2018)  . [DISCONTINUED] Calcium Carb-Cholecalciferol (CALCIUM 1000 + D PO) Take 3 g by mouth daily.  . [DISCONTINUED] gabapentin (NEURONTIN) 300 MG capsule Take 1 capsule by mouth at bedtime.  . [DISCONTINUED] Methylsulfonylmethane (MSM) 1000 MG CAPS Take 1 capsule by mouth 3 (three) times daily.   . [DISCONTINUED] Multiple Vitamins-Minerals (CENTRUM SILVER ADULT 50+) TABS Take 1 tablet by mouth every morning. Reported on 12/12/2015  . [DISCONTINUED] omeprazole (PRILOSEC) 20 MG capsule Take 1 tablet by mouth every morning.  . [DISCONTINUED] traMADol (ULTRAM) 50 MG tablet Take 1 tablet (50 mg total) by mouth every 8 (eight) hours as needed. (Patient not taking: Reported on 12/02/2018)   No facility-administered encounter medications on file as of 06/01/2019.     PHYSICAL EXAM:   General: NAD, slender, pleasantly conversant. Husband [name] [name] present Cardiovascular: regular rate and rhythm Pulmonary: clear ant fields Abdomen: soft, nontender, + bowel sounds GU: no suprapubic tenderness Extremities: no edema, no joint deformities Skin: no rashes Neurological: Mild weakness but otherwise nonfocal  Julianne Handler, NP

## 2019-06-12 ENCOUNTER — Encounter: Payer: Self-pay | Admitting: General Surgery

## 2019-06-12 NOTE — Progress Notes (Signed)
CC: having burning above sternum, choking, and problems swallowing.

## 2019-06-13 ENCOUNTER — Ambulatory Visit (INDEPENDENT_AMBULATORY_CARE_PROVIDER_SITE_OTHER): Payer: Medicare HMO | Admitting: Internal Medicine

## 2019-06-13 ENCOUNTER — Encounter: Payer: Self-pay | Admitting: Internal Medicine

## 2019-06-13 VITALS — Ht 64.0 in | Wt 108.0 lb

## 2019-06-13 DIAGNOSIS — R0789 Other chest pain: Secondary | ICD-10-CM | POA: Diagnosis not present

## 2019-06-13 DIAGNOSIS — R1310 Dysphagia, unspecified: Secondary | ICD-10-CM | POA: Diagnosis not present

## 2019-06-13 DIAGNOSIS — K219 Gastro-esophageal reflux disease without esophagitis: Secondary | ICD-10-CM | POA: Diagnosis not present

## 2019-06-13 DIAGNOSIS — R413 Other amnesia: Secondary | ICD-10-CM | POA: Diagnosis not present

## 2019-06-13 NOTE — Progress Notes (Signed)
HISTORY OF PRESENT ILLNESS:  [name] [name] is a 83 y.o. female with a history of GERD complicated by peptic stricture requiring esophageal dilation.  She schedules this telehealth medicine visit during the coronavirus pandemic regarding multiple complaints including dysphasia, substernal burning discomfort, chest pain adjacent to the sternum, and non-GI complaints including some difficulties with speech and memory.  She is accompanied today by her husband who actively participates throughout the encounter.  The patient and her husband tell me that she has had intermittent problems with solid food dysphasia over the past 6 months.  Progressively worse.  For her reflux symptoms she has been compliant with pantoprazole 40 mg twice daily.  She will however experience some breakthrough symptoms.  No vomiting.  She has had significant problems with multiple compression fractures of her back for which she has been on narcotics.  Now weaning off of narcotics.  Problems with constipation have been managed with MiraLAX and Colace.  She has had some weight loss.  In terms of her chest pain she tells me that this began after undergoing an echocardiogram.  She felt this might be secondary to the placement of the echo probe.  Her last office visit was April 2018.  Doing well at that time.  Review of relevant outside laboratories from October 2019 reveals normal CBC and comprehensive metabolic panel.  At that time she was evaluated in this office for left flank pain.  She was found to have a urinary tract infection which was treated with ciprofloxacin.  Patient did undergo CT scan of the chest abdomen pelvis to evaluate her weight loss and pain.  No evidence for malignant disease.  However significant compression fractures at T9, T12, and L2.  She is up-to-date and has aged out of colon cancer surveillance program.  Last exam 2015  REVIEW OF SYSTEMS:  All non-GI ROS negative unless otherwise stated in the HPI except for  back pain  Past Medical History:  Diagnosis Date  . Acid reflux disease   . Anemia    history of  . Arthritis    both hands  . Blood transfusion without reported diagnosis   . Cataract    bilateral cateracts removed  . Common migraine   . Diverticulosis    patient denies  . DJD (degenerative joint disease)   . Esophageal stricture   . Herniated disc   . Hiatal hernia 2009   EGD-Small   . History of colonic polyps   . Hyperlipidemia   . Lacunar infarction ([place])   . Macular degeneration   . Osteoporosis   . Sleep apnea    has c-pap but does not wear    Past Surgical History:  Procedure Laterality Date  . CHOLECYSTECTOMY     4-5 YRS AGO.  Marland Kitchen COLONOSCOPY    . ESOPHAGEAL DILATION    . EYE SURGERY     BILATERAL CATARAT  . INGUINAL HERNIA REPAIR     rt.  Marland Kitchen RECTOCELE REPAIR N/A 02/13/2013   Procedure: POSTERIOR REPAIR (RECTOCELE);  Surgeon: Emily Filbert, MD;  Location: [facility];  Service: Gynecology;  Laterality: N/A;  . ROBOTIC ASSISTED LAPAROSCOPIC LYSIS OF ADHESION  02/13/2016   Procedure: ROBOTIC ASSISTED LAPAROSCOPIC LYSIS OF ADHESION;  Surgeon: Princess Bruins, MD;  Location: [facility];  Service: Gynecology;;  . ROBOTIC ASSISTED LAPAROSCOPIC SACROCOLPOPEXY N/A 02/13/2016   Procedure: ROBOTIC ASSISTED LAPAROSCOPIC SACROCOLPOPEXY With Theola Sequin;  Surgeon: Princess Bruins, MD;  Location: [facility];  Service: Gynecology;  Laterality: N/A;  .  TOTAL ABDOMINAL HYSTERECTOMY    . UPPER GASTROINTESTINAL ENDOSCOPY      Social History [employer]  reports that she has quit smoking. Her smoking use included cigarettes. She quit after 10.00 years of use. She has never used smokeless tobacco. She reports that she does not drink alcohol or use drugs.  family history includes Aneurysm (age of onset: 76) in her sister; Breast cancer in her maternal aunt and paternal aunt; Cancer in her brother; Colon cancer in her maternal aunt.  Allergies  Allergen Reactions  .  Isometheptene-Dichloral-Apap Nausea Only    (midrin)  . Oxycodone Hcl Nausea And Vomiting    Pt reports tolerating vicodin       PHYSICAL EXAMINATION: Physical exam with telehealth medicine visit   ASSESSMENT:  1.  GERD.  Breakthrough symptoms in recent months.  Likely secondary to combination of decreased general mobility and decreased GI motility from narcotics. 2.  Recurrent intermittent solid food dysphasia as previous.  Likely due to known peptic stricture.  She feels that she would benefit from repeat dilation.  I agree 3.  Atypical chest pain.  Sounds musculoskeletal.  May be related to back issues, particularly T12, with possible radicular features 4.  Concerns over speech and memory.  Defer to Dr. Brigitte Pulse 5.  Last colonoscopy 2015.  Aged out  PLAN:  1.  Continue pantoprazole 40 mg twice daily. 2.  Schedule upper endoscopy with esophageal dilation and to evaluate burning discomfort and address recurrent dysphasia in a patient with known peptic stricture.  Patient is high risk given her age and frail state.The nature of the procedure, as well as the risks, benefits, and alternatives were carefully and thoroughly reviewed with the patient. Ample time for discussion and questions allowed. The patient understood, was satisfied, and agreed to proceed. 3.  More formally assess area of chest discomfort at time of endoscopy via physical examination 4.  General medical care with Dr. Brigitte Pulse This telehealth medicine visit was initiated by and consented for by the patient was in her home while I was in my office.  She understands that there may be associated professional charge for this service which totaled 25 minutes in duration

## 2019-06-13 NOTE — Patient Instructions (Signed)
You have been scheduled for an endoscopy. Please follow written instructions given to you at your visit today. If you use inhalers (even only as needed), please bring them with you on the day of your procedure.  PLEASE INITIAL AND SIGN WHERE INDICATED ON THE PATIENT ACKNOWLEDGEMENT FORM AND BRING WITH YOU TO YOUR PROCEDURE.  Thanks!!

## 2019-06-14 ENCOUNTER — Telehealth: Payer: Self-pay | Admitting: Internal Medicine

## 2019-06-14 NOTE — Telephone Encounter (Signed)

## 2019-06-15 ENCOUNTER — Encounter: Payer: Self-pay | Admitting: Internal Medicine

## 2019-06-15 ENCOUNTER — Other Ambulatory Visit: Payer: Self-pay

## 2019-06-15 ENCOUNTER — Ambulatory Visit (AMBULATORY_SURGERY_CENTER): Payer: Medicare HMO | Admitting: Internal Medicine

## 2019-06-15 VITALS — BP 130/82 | HR 84 | Temp 99.1°F | Resp 17 | Ht 64.0 in | Wt 108.0 lb

## 2019-06-15 DIAGNOSIS — K449 Diaphragmatic hernia without obstruction or gangrene: Secondary | ICD-10-CM

## 2019-06-15 DIAGNOSIS — K219 Gastro-esophageal reflux disease without esophagitis: Secondary | ICD-10-CM

## 2019-06-15 DIAGNOSIS — R1310 Dysphagia, unspecified: Secondary | ICD-10-CM

## 2019-06-15 DIAGNOSIS — K222 Esophageal obstruction: Secondary | ICD-10-CM

## 2019-06-15 DIAGNOSIS — R0789 Other chest pain: Secondary | ICD-10-CM

## 2019-06-15 MED ORDER — SODIUM CHLORIDE 0.9 % IV SOLN: 500.0000 mL | Freq: Once | INTRAVENOUS | Status: DC

## 2019-06-15 NOTE — Patient Instructions (Signed)
Information on esophagitis and dilation diet given to you today.  Continue Pantoprazole 40 mg twice a day until reflux symptoms improve.Then decrease unto once a day as long as this is effective.  YOU HAD AN ENDOSCOPIC PROCEDURE TODAY AT [facility]:   Refer to the procedure report that was given to you for any specific questions about what was found during the examination.  If the procedure report does not answer your questions, please call your gastroenterologist to clarify.  If you requested that your care partner not be given the details of your procedure findings, then the procedure report has been included in a sealed envelope for you to review at your convenience later.  YOU SHOULD EXPECT: Some feelings of bloating in the abdomen. Passage of more gas than usual.  Walking can help get rid of the air that was put into your GI tract during the procedure and reduce the bloating. If you had a lower endoscopy (such as a colonoscopy or flexible sigmoidoscopy) you may notice spotting of blood in your stool or on the toilet paper. If you underwent a bowel prep for your procedure, you may not have a normal bowel movement for a few days.  Please Note:  You might notice some irritation and congestion in your nose or some drainage.  This is from the oxygen used during your procedure.  There is no need for concern and it should clear up in a day or so.  SYMPTOMS TO REPORT IMMEDIATELY:   Following upper endoscopy (EGD)  Vomiting of blood or coffee ground material  New chest pain or pain under the shoulder blades  Painful or persistently difficult swallowing  New shortness of breath  Fever of 100F or higher  Black, tarry-looking stools  For urgent or emergent issues, a gastroenterologist can be reached at any hour by calling [phone].   DIET:  We do recommend a small meal at first, but then you may proceed to your regular diet.  Drink plenty of fluids but you should avoid  alcoholic beverages for 24 hours.  ACTIVITY:  You should plan to take it easy for the rest of today and you should NOT DRIVE or use heavy machinery until tomorrow (because of the sedation medicines used during the test).    FOLLOW UP: Our staff will call the number listed on your records 48-72 hours following your procedure to check on you and address any questions or concerns that you may have regarding the information given to you following your procedure. If we do not reach you, we will leave a message.  We will attempt to reach you two times.  During this call, we will ask if you have developed any symptoms of COVID 19. If you develop any symptoms (ie: fever, flu-like symptoms, shortness of breath, cough etc.) before then, please call [phone].  If you test positive for Covid 19 in the 2 weeks post procedure, please call and report this information to [place].    If any biopsies were taken you will be contacted by phone or by letter within the next 1-3 weeks.  Please call us at [phone] if you have not heard about the biopsies in 3 weeks.    SIGNATURES/CONFIDENTIALITY: You and/or your care partner have signed paperwork which will be entered into your electronic medical record.  These signatures attest to the fact that that the information above on your After Visit Summary has been reviewed and is understood.  Full responsibility of the  confidentiality of this discharge information lies with you and/or your care-partner. 

## 2019-06-15 NOTE — Progress Notes (Signed)
Called to room to assist during endoscopic procedure.  Patient ID and intended procedure confirmed with present staff. Received instructions for my participation in the procedure from the performing physician.  

## 2019-06-15 NOTE — Progress Notes (Signed)
A/ox3, pleased with MAC, report to RN 

## 2019-06-15 NOTE — Op Note (Signed)
[facility] Patient Name: [name] [name] Procedure Date: 06/15/2019 1:59 PM MRN: [id number] Endoscopist: Docia Chuck. Henrene Pastor , MD Age: 83 Referring MD:  Date of Birth: [birth year] Gender: Female Account #: [id number] Procedure:                Upper GI endoscopy with [facility] dilation of the                            esophagus. 35 French Indications:              Dysphagia. Worsening reflux symptoms. Chest pain Medicines:                Monitored Anesthesia Care Procedure:                Pre-Anesthesia Assessment:                           - Prior to the procedure, a History and Physical                            was performed, and patient medications and                            allergies were reviewed. The patient's tolerance of                            previous anesthesia was also reviewed. The risks                            and benefits of the procedure and the sedation                            options and risks were discussed with the patient.                            All questions were answered, and informed consent                            was obtained. Prior Anticoagulants: The patient has                            taken no previous anticoagulant or antiplatelet                            agents. ASA Grade Assessment: II - A patient with                            mild systemic disease. After reviewing the risks                            and benefits, the patient was deemed in                            satisfactory condition to undergo the procedure.  After obtaining informed consent, the endoscope was                            passed under direct vision. Throughout the                            procedure, the patient's blood pressure, pulse, and                            oxygen saturations were monitored continuously. The                            Endoscope was introduced through the mouth, and                            advanced  to the second part of duodenum. The upper                            GI endoscopy was accomplished without difficulty.                            The patient tolerated the procedure well. Scope In: Scope Out: Findings:                 One benign-appearing, intrinsic moderate stenosis                            was found 35 cm from the incisors. This stenosis                            measured 1.5 cm (inner diameter). The scope was                            withdrawn. Dilation was performed with a Maloney                            dilator with no resistance at 71 Fr.                           The exam of the esophagus was otherwise normal.                           The stomach was normal except for a small sliding                            hiatal hernia.                           The examined duodenum was normal.                           The cardia and gastric fundus were normal on                            retroflexion.  Complications:            No immediate complications. Estimated Blood Loss:     Estimated blood loss: none. Impression:               1. Esophageal stricture status post dilation                           2. GERD with recent breakthrough symptoms                           3. Chest pain. Not felt to be GI in origin. Suspect                            referred pain from back. Recommendation:           1. Patient has a contact number available for                            emergencies. The signs and symptoms of potential                            delayed complications were discussed with the                            patient. Return to normal activities tomorrow.                            Written discharge instructions were provided to the                            patient.                           2. Post dilation diet                           3. Continue pantoprazole 40 mg twice daily until                            reflux symptoms improve. Then decrease to  once                            daily as long as this is effective                           4. Resume general medical care with Dr. Brigitte Pulse. GI                            follow-up as needed. Docia Chuck. Henrene Pastor, MD 06/15/2019 2:15:17 PM This report has been signed electronically.

## 2019-06-19 ENCOUNTER — Telehealth: Payer: Self-pay | Admitting: *Deleted

## 2019-06-19 NOTE — Telephone Encounter (Signed)
  Follow up Call-  Call back number 06/15/2019  Post procedure Call Back phone  # [phone]  Permission to leave phone message Yes  Some recent data might be hidden    Spoke with husband Patient questions:  Do you have a fever, pain , or abdominal swelling? No. Pain Score  0 *  Have you tolerated food without any problems? Yes.    Have you been able to return to your normal activities? Yes.    Do you have any questions about your discharge instructions: Diet   No. Medications  No. Follow up visit  No.  Do you have questions or concerns about your Care? No.  Actions: * If pain score is 4 or above: No action needed, pain <4.  1. Have you developed a fever since your procedure? no  2.   Have you had an respiratory symptoms (SOB or cough) since your procedure? no  3.   Have you tested positive for COVID 19 since your procedure no  4.   Have you had any family members/close contacts diagnosed with the COVID 19 since your procedure?  no   If yes to any of these questions please route to Joylene John, RN and Alphonsa Gin, Therapist, sports.

## 2019-07-03 DIAGNOSIS — Z961 Presence of intraocular lens: Secondary | ICD-10-CM | POA: Diagnosis not present

## 2019-07-03 DIAGNOSIS — H43813 Vitreous degeneration, bilateral: Secondary | ICD-10-CM | POA: Diagnosis not present

## 2019-07-03 DIAGNOSIS — H35363 Drusen (degenerative) of macula, bilateral: Secondary | ICD-10-CM | POA: Diagnosis not present

## 2019-07-03 DIAGNOSIS — H353134 Nonexudative age-related macular degeneration, bilateral, advanced atrophic with subfoveal involvement: Secondary | ICD-10-CM | POA: Diagnosis not present

## 2019-07-06 ENCOUNTER — Other Ambulatory Visit: Payer: Medicare HMO | Admitting: Internal Medicine

## 2019-07-06 ENCOUNTER — Other Ambulatory Visit: Payer: Self-pay

## 2019-07-06 DIAGNOSIS — Z515 Encounter for palliative care: Secondary | ICD-10-CM | POA: Diagnosis not present

## 2019-07-06 NOTE — Progress Notes (Addendum)
Aug 6th, 2020, 2020 [facility] Palliative Care Consult Note Telephone: [phone]  Fax: [phone]   PATIENT NAME: [name] [name] DOB: 17-Nov-1936 MRN: [id number] Home: [place], [place] [address]   PRIMARY CARE PROVIDER:   Marton Redwood, MD  Dr. Scarlette [name] (GI)   REFERRING PROVIDER:  Marton Redwood, MD [place], [place] [address]   RESPONSIBLE PARTY:   Patient's husband [name] [name] [phone] Son [name] [name] (oral surgeon) [phone].   RECOMMENDATIONS and PLAN:  1.Advance Care Planning:  A.  Advance Care Directives:  MOST document in home and in [place]. Details: DNR/DNI. Limited scope of medical intervention. Yes to antibiotics/IVFs. No to tube feedings.  B. Goals of Care: Maintain / improve ability for selfcare and independence.   2. Cognitive and Functional status: Overall improvement in cognition off opioids. No further chest pain since EGD with esophageal dilation. Back discomfort if on her feet extended periods of time. Chest pain not elicited with exertion. Her Protonix was decreased to qd. Spouse notes decreased symptoms of GERD with improved posture. Patient ambulates (rollator walker) outside with her husband 30 min/day with improving endurance and speed. Improved appetite; weight stable.  -we reviewed extremity exercises she could do tid.   3. Family supports: Resides in first floor level condo with husband who is in good health and is available 24/7 to assist with care. Children live close by and are supportive. They have been observing strict COVID restrictions and coping well with these. Patient has decreased vision; now enjoying CDs since [employer] has reopened.   4. NP Palliative Care follow up: doing well; will call on a prn basis  I spent 60 minutes providing this consultation from 11am to noon. More than 50% of the time in this consultation was spent coordinating communication.     HISTORY OF PRESENT ILLNESS:  [name] [name] is a 83 y.o. female with h/o BPPV (benign paroxysmal positional vertigo) bilateral, GERD, anemia, arthritis (hands), migraine (chronic daily), esophageal stricture (dilation x 3; last 06/15/2019), diverticulosis, herniated disc, hiatal hernia, HLD, Lacunar infarction, macular degeneration (legally blind) sleep apnea (c-pap doesn't wear), and DJD (spondylisthesis L3-L5; compression rx T12 Nov 2019). Patient is s/p OP rehab [facility] (Jan 2020).  This is a f/u Palliative Care visit from 06/01/2019.    CODE STATUS:  MOST document in home and in [place]. Details: DNR/DNI. Limited scope of medical intervention. Yes to antibiotics/IVFs. No to tube feedings.   PPS: 60-70%   HOSPICE ELIGIBILITY/DIAGNOSIS: no; current prognosis though to be greater than 6 months.  PAST MEDICAL HISTORY:  Past Medical History:  Diagnosis Date  . Acid reflux disease   . Anemia    history of  . Arthritis    both hands  . Blood transfusion without reported diagnosis   . Cataract    bilateral cateracts removed  . Common migraine   . Diverticulosis    patient denies  . DJD (degenerative joint disease)   . Esophageal stricture   . Herniated disc   . Hiatal hernia 2009   EGD-Small   . History of colonic polyps   . Hyperlipidemia   . Lacunar infarction ([place])   . Macular degeneration   . Osteoporosis   . Sleep apnea    has c-pap but does not wear    SOCIAL HX:  Social History   Tobacco Use  . Smoking status: Former Smoker  Years: 10.00    Types: Cigarettes  . Smokeless tobacco: Never Used  Substance Use Topics  . Alcohol use: No    ALLERGIES:  Allergies  Allergen Reactions  . Isometheptene-Dichloral-Apap Nausea Only    (midrin)  . Oxycodone Hcl Nausea And Vomiting    Pt reports tolerating vicodin     PERTINENT MEDICATIONS:  Outpatient Encounter Medications as of 07/06/2019  Medication Sig  . Abaloparatide (TYMLOS [place]) Inject  into the skin daily.  Marland Kitchen acetaminophen (TYLENOL) 500 MG tablet Take 500 mg by mouth every 6 (six) hours as needed.  . pantoprazole (PROTONIX) 40 MG tablet Take 40 mg by mouth daily.  . polyethylene glycol (MIRALAX / GLYCOLAX) packet Take 17 g by mouth 2 (two) times daily as needed. (Patient not taking: Reported on 06/15/2019)  . [DISCONTINUED] docusate sodium (COLACE) 100 MG capsule Take 100 mg by mouth 2 (two) times daily as needed for mild constipation.  . [DISCONTINUED] ibuprofen (ADVIL) 200 MG tablet Take 200 mg by mouth every 4 (four) hours as needed.  . [DISCONTINUED] ranitidine (ZANTAC) 150 MG capsule Take 150 mg by mouth 2 (two) times daily.   No facility-administered encounter medications on file as of 07/06/2019.     PHYSICAL EXAM:   General: NAD, slender, pleasantly conversant. Husband [name] [name] present Cardiovascular: regular rate and rhythm Pulmonary: clear ant fields Abdomen: soft, nontender, + bowel sounds GU: no suprapubic tenderness Extremities: no edema, no joint deformities Skin: no rashes Neurological: Mild weakness but otherwise nonfocal  [name] Handler, NP

## 2019-07-16 ENCOUNTER — Encounter: Payer: Self-pay | Admitting: Internal Medicine

## 2019-07-19 DIAGNOSIS — M25552 Pain in left hip: Secondary | ICD-10-CM | POA: Diagnosis not present

## 2019-07-19 DIAGNOSIS — M25551 Pain in right hip: Secondary | ICD-10-CM | POA: Diagnosis not present

## 2019-07-19 DIAGNOSIS — W19XXXA Unspecified fall, initial encounter: Secondary | ICD-10-CM | POA: Diagnosis not present

## 2019-08-02 ENCOUNTER — Ambulatory Visit
Admission: RE | Admit: 2019-08-02 | Discharge: 2019-08-02 | Disposition: A | Discharge status: Home / Self Care | Payer: Medicare HMO | Source: Ambulatory Visit | Attending: Internal Medicine | Admitting: Internal Medicine

## 2019-08-02 ENCOUNTER — Other Ambulatory Visit: Payer: Self-pay

## 2019-08-02 DIAGNOSIS — Z1231 Encounter for screening mammogram for malignant neoplasm of breast: Secondary | ICD-10-CM | POA: Diagnosis not present

## 2019-08-03 DIAGNOSIS — M25551 Pain in right hip: Secondary | ICD-10-CM | POA: Diagnosis not present

## 2019-08-03 DIAGNOSIS — K219 Gastro-esophageal reflux disease without esophagitis: Secondary | ICD-10-CM | POA: Diagnosis not present

## 2019-08-03 DIAGNOSIS — Z23 Encounter for immunization: Secondary | ICD-10-CM | POA: Diagnosis not present

## 2019-08-03 DIAGNOSIS — R0789 Other chest pain: Secondary | ICD-10-CM | POA: Diagnosis not present

## 2019-08-03 DIAGNOSIS — M810 Age-related osteoporosis without current pathological fracture: Secondary | ICD-10-CM | POA: Diagnosis not present

## 2019-08-03 DIAGNOSIS — E46 Unspecified protein-calorie malnutrition: Secondary | ICD-10-CM | POA: Diagnosis not present

## 2019-08-25 DIAGNOSIS — L738 Other specified follicular disorders: Secondary | ICD-10-CM | POA: Diagnosis not present

## 2019-08-25 DIAGNOSIS — L720 Epidermal cyst: Secondary | ICD-10-CM | POA: Diagnosis not present

## 2019-08-25 DIAGNOSIS — B070 Plantar wart: Secondary | ICD-10-CM | POA: Diagnosis not present

## 2019-08-25 DIAGNOSIS — D485 Neoplasm of uncertain behavior of skin: Secondary | ICD-10-CM | POA: Diagnosis not present

## 2019-08-25 DIAGNOSIS — L298 Other pruritus: Secondary | ICD-10-CM | POA: Diagnosis not present

## 2019-08-25 DIAGNOSIS — L821 Other seborrheic keratosis: Secondary | ICD-10-CM | POA: Diagnosis not present

## 2019-08-25 DIAGNOSIS — L814 Other melanin hyperpigmentation: Secondary | ICD-10-CM | POA: Diagnosis not present

## 2019-08-25 DIAGNOSIS — L281 Prurigo nodularis: Secondary | ICD-10-CM | POA: Diagnosis not present

## 2019-11-16 DIAGNOSIS — N390 Urinary tract infection, site not specified: Secondary | ICD-10-CM | POA: Diagnosis not present

## 2020-01-03 DIAGNOSIS — R7301 Impaired fasting glucose: Secondary | ICD-10-CM | POA: Diagnosis not present

## 2020-01-03 DIAGNOSIS — E781 Pure hyperglyceridemia: Secondary | ICD-10-CM | POA: Diagnosis not present

## 2020-01-03 DIAGNOSIS — Z0000 Encounter for general adult medical examination without abnormal findings: Secondary | ICD-10-CM | POA: Diagnosis not present

## 2020-01-05 DIAGNOSIS — R82998 Other abnormal findings in urine: Secondary | ICD-10-CM | POA: Diagnosis not present

## 2020-01-10 DIAGNOSIS — R7301 Impaired fasting glucose: Secondary | ICD-10-CM | POA: Diagnosis not present

## 2020-01-10 DIAGNOSIS — Z1339 Encounter for screening examination for other mental health and behavioral disorders: Secondary | ICD-10-CM | POA: Diagnosis not present

## 2020-01-10 DIAGNOSIS — E781 Pure hyperglyceridemia: Secondary | ICD-10-CM | POA: Diagnosis not present

## 2020-01-10 DIAGNOSIS — Z0000 Encounter for general adult medical examination without abnormal findings: Secondary | ICD-10-CM | POA: Diagnosis not present

## 2020-01-10 DIAGNOSIS — R030 Elevated blood-pressure reading, without diagnosis of hypertension: Secondary | ICD-10-CM | POA: Diagnosis not present

## 2020-01-10 DIAGNOSIS — K219 Gastro-esophageal reflux disease without esophagitis: Secondary | ICD-10-CM | POA: Diagnosis not present

## 2020-01-10 DIAGNOSIS — M810 Age-related osteoporosis without current pathological fracture: Secondary | ICD-10-CM | POA: Diagnosis not present

## 2020-01-10 DIAGNOSIS — G3184 Mild cognitive impairment, so stated: Secondary | ICD-10-CM | POA: Diagnosis not present

## 2020-01-10 DIAGNOSIS — Z1331 Encounter for screening for depression: Secondary | ICD-10-CM | POA: Diagnosis not present

## 2020-01-10 DIAGNOSIS — R0789 Other chest pain: Secondary | ICD-10-CM | POA: Diagnosis not present

## 2020-01-10 DIAGNOSIS — E46 Unspecified protein-calorie malnutrition: Secondary | ICD-10-CM | POA: Diagnosis not present

## 2020-02-09 ENCOUNTER — Other Ambulatory Visit: Payer: Self-pay

## 2020-02-09 ENCOUNTER — Emergency Department (HOSPITAL_COMMUNITY): Payer: Medicare HMO

## 2020-02-09 ENCOUNTER — Emergency Department (HOSPITAL_COMMUNITY)
Admission: EM | Admit: 2020-02-09 | Discharge: 2020-02-09 | Disposition: A | Discharge status: Home / Self Care | Payer: Medicare HMO | Attending: Emergency Medicine | Admitting: Emergency Medicine

## 2020-02-09 ENCOUNTER — Encounter (HOSPITAL_COMMUNITY): Payer: Self-pay | Admitting: Emergency Medicine

## 2020-02-09 DIAGNOSIS — R29810 Facial weakness: Secondary | ICD-10-CM | POA: Diagnosis not present

## 2020-02-09 DIAGNOSIS — S065X9A Traumatic subdural hemorrhage with loss of consciousness of unspecified duration, initial encounter: Secondary | ICD-10-CM | POA: Diagnosis not present

## 2020-02-09 DIAGNOSIS — S065X0A Traumatic subdural hemorrhage without loss of consciousness, initial encounter: Secondary | ICD-10-CM | POA: Diagnosis not present

## 2020-02-09 DIAGNOSIS — Y92019 Unspecified place in single-family (private) house as the place of occurrence of the external cause: Secondary | ICD-10-CM | POA: Insufficient documentation

## 2020-02-09 DIAGNOSIS — G8190 Hemiplegia, unspecified affecting unspecified side: Secondary | ICD-10-CM | POA: Diagnosis not present

## 2020-02-09 DIAGNOSIS — Y939 Activity, unspecified: Secondary | ICD-10-CM | POA: Insufficient documentation

## 2020-02-09 DIAGNOSIS — R519 Headache, unspecified: Secondary | ICD-10-CM | POA: Insufficient documentation

## 2020-02-09 DIAGNOSIS — I6200 Nontraumatic subdural hemorrhage, unspecified: Secondary | ICD-10-CM | POA: Diagnosis not present

## 2020-02-09 DIAGNOSIS — Z20822 Contact with and (suspected) exposure to covid-19: Secondary | ICD-10-CM | POA: Diagnosis not present

## 2020-02-09 DIAGNOSIS — W1830XA Fall on same level, unspecified, initial encounter: Secondary | ICD-10-CM | POA: Diagnosis not present

## 2020-02-09 DIAGNOSIS — R531 Weakness: Secondary | ICD-10-CM | POA: Diagnosis not present

## 2020-02-09 DIAGNOSIS — Y999 Unspecified external cause status: Secondary | ICD-10-CM | POA: Diagnosis not present

## 2020-02-09 DIAGNOSIS — Z87891 Personal history of nicotine dependence: Secondary | ICD-10-CM | POA: Diagnosis not present

## 2020-02-09 DIAGNOSIS — I609 Nontraumatic subarachnoid hemorrhage, unspecified: Secondary | ICD-10-CM | POA: Diagnosis not present

## 2020-02-09 DIAGNOSIS — I6201 Nontraumatic acute subdural hemorrhage: Secondary | ICD-10-CM | POA: Diagnosis not present

## 2020-02-09 DIAGNOSIS — R4781 Slurred speech: Secondary | ICD-10-CM | POA: Diagnosis not present

## 2020-02-09 DIAGNOSIS — R000 Tachycardia, unspecified: Secondary | ICD-10-CM | POA: Diagnosis not present

## 2020-02-09 DIAGNOSIS — G4489 Other headache syndrome: Secondary | ICD-10-CM | POA: Diagnosis not present

## 2020-02-09 DIAGNOSIS — S065XAA Traumatic subdural hemorrhage with loss of consciousness status unknown, initial encounter: Secondary | ICD-10-CM

## 2020-02-09 DIAGNOSIS — R410 Disorientation, unspecified: Secondary | ICD-10-CM | POA: Diagnosis present

## 2020-02-09 DIAGNOSIS — S066X0A Traumatic subarachnoid hemorrhage without loss of consciousness, initial encounter: Secondary | ICD-10-CM | POA: Diagnosis not present

## 2020-02-09 LAB — COMPREHENSIVE METABOLIC PANEL
ALT: 17 U/L (ref 0–44)
AST: 24 U/L (ref 15–41)
Albumin: 3.9 g/dL (ref 3.5–5.0)
Alkaline Phosphatase: 69 U/L (ref 38–126)
Anion gap: 13 (ref 5–15)
BUN: 14 mg/dL (ref 8–23)
CO2: 23 mmol/L (ref 22–32)
Calcium: 9.5 mg/dL (ref 8.9–10.3)
Chloride: 101 mmol/L (ref 98–111)
Creatinine, Ser: 0.87 mg/dL (ref 0.44–1.00)
GFR calc Af Amer: >60 mL/min (ref >60)
GFR calc non Af Amer: >60 mL/min (ref >60)
Glucose, Bld: 135 mg/dL — ABNORMAL HIGH (ref 70–99)
Potassium: 3.8 mmol/L (ref 3.5–5.1)
Sodium: 137 mmol/L (ref 135–145)
Total Bilirubin: 0.8 mg/dL (ref 0.3–1.2)
Total Protein: 7.2 g/dL (ref 6.5–8.1)

## 2020-02-09 LAB — I-STAT CHEM 8, ED
BUN: 15 mg/dL (ref 8–23)
Calcium, Ion: 1.13 mmol/L — ABNORMAL LOW (ref 1.15–1.40)
Chloride: 102 mmol/L (ref 98–111)
Creatinine, Ser: 0.7 mg/dL (ref 0.44–1.00)
Glucose, Bld: 128 mg/dL — ABNORMAL HIGH (ref 70–99)
HCT: 40 % (ref 36.0–46.0)
Hemoglobin: 13.6 g/dL (ref 12.0–15.0)
Potassium: 3.8 mmol/L (ref 3.5–5.1)
Sodium: 136 mmol/L (ref 135–145)
TCO2: 29 mmol/L (ref 22–32)

## 2020-02-09 LAB — PROTIME-INR
INR: 1.3 — ABNORMAL HIGH (ref 0.8–1.2)
Prothrombin Time: 15.9 seconds — ABNORMAL HIGH (ref 11.4–15.2)

## 2020-02-09 LAB — DIFFERENTIAL
Abs Immature Granulocytes: 0.07 10*3/uL (ref 0.00–0.07)
Basophils Absolute: 0.1 10*3/uL (ref 0.0–0.1)
Basophils Relative: 1 %
Eosinophils Absolute: 0.1 10*3/uL (ref 0.0–0.5)
Eosinophils Relative: 1 %
Immature Granulocytes: 1 %
Lymphocytes Relative: 18 %
Lymphs Abs: 1.8 10*3/uL (ref 0.7–4.0)
Monocytes Absolute: 1.5 10*3/uL — ABNORMAL HIGH (ref 0.1–1.0)
Monocytes Relative: 15 %
Neutro Abs: 6.7 10*3/uL (ref 1.7–7.7)
Neutrophils Relative %: 64 %

## 2020-02-09 LAB — RESPIRATORY PANEL BY RT PCR (FLU A&B, COVID)
Influenza A by PCR: NEGATIVE
Influenza B by PCR: NEGATIVE
SARS Coronavirus 2 by RT PCR: NEGATIVE

## 2020-02-09 LAB — APTT: aPTT: 34 seconds (ref 24–36)

## 2020-02-09 LAB — CBC
HCT: 40.6 % (ref 36.0–46.0)
Hemoglobin: 13.4 g/dL (ref 12.0–15.0)
MCH: 28.5 pg (ref 26.0–34.0)
MCHC: 33 g/dL (ref 30.0–36.0)
MCV: 86.4 fL (ref 80.0–100.0)
Platelets: 210 10*3/uL (ref 150–400)
RBC: 4.7 MIL/uL (ref 3.87–5.11)
RDW: 13.3 % (ref 11.5–15.5)
WBC: 10.2 10*3/uL (ref 4.0–10.5)
nRBC: 0 % (ref 0.0–0.2)

## 2020-02-09 LAB — CBG MONITORING, ED: Glucose-Capillary: 120 mg/dL — ABNORMAL HIGH (ref 70–99)

## 2020-02-09 MED ORDER — LEVETIRACETAM 500 MG PO TABS: 500.0000 mg | ORAL_TABLET | Freq: Two times a day (BID) | ORAL | Status: DC

## 2020-02-09 MED ORDER — SODIUM CHLORIDE 0.9% FLUSH
3.0000 mL | Freq: Once | INTRAVENOUS | Status: DC
Start: 2020-02-09 — End: 2020-02-10

## 2020-02-09 MED ORDER — LEVETIRACETAM 500 MG PO TABS: 500.0000 mg | ORAL_TABLET | Freq: Two times a day (BID) | ORAL | 1 refills | Status: AC

## 2020-02-09 MED ORDER — LEVETIRACETAM IN NACL 1000 MG/100ML IV SOLN
1000.0000 mg | INTRAVENOUS | Status: AC
  Administered 2020-02-09: 1000 mg via INTRAVENOUS
  Filled 2020-02-09: qty 100

## 2020-02-09 MED ORDER — CLEVIDIPINE BUTYRATE 0.5 MG/ML IV EMUL: 0.0000 mg/h | INTRAVENOUS | Status: DC

## 2020-02-09 MED ORDER — LABETALOL HCL 5 MG/ML IV SOLN
10.0000 mg | Freq: Once | INTRAVENOUS | Status: AC
  Administered 2020-02-09: 10 mg via INTRAVENOUS
  Filled 2020-02-09: qty 4

## 2020-02-09 MED ORDER — IOHEXOL 350 MG/ML SOLN
100.0000 mL | Freq: Once | INTRAVENOUS | Status: AC | PRN
  Administered 2020-02-09: 100 mL via INTRAVENOUS

## 2020-02-09 NOTE — ED Triage Notes (Signed)
Pt brought to ED by [facility] from home for  Sudden unset of left side weakness, slurred speech, facial droop. Last seen well today at 20:00. On ED arrival only weakness on left side arm and leg noticed. Pt is AO x 4. CBG 1236, BP 150/90, HR 120, 98% RA

## 2020-02-09 NOTE — ED Provider Notes (Signed)
[name] [name]   CSN: [id number] Arrival date & time: 02/09/20  2119     History Chief Complaint  Patient presents with  . Code Stroke    [name] [name] is a 84 y.o. female.  The history is provided by the patient, the EMS personnel and medical records. No language interpreter was used.   [name] [name] is a 84 y.o. female who presents to the Emergency Department complaining of code stroke.  Level V caveat due to confusion.  She presents to the ED by EMS, last known well at 8pm.  Just prior to ED arrival she was noted to have some trembling of her hands and was found to have a left facial droop and left sided weakness.  She complains of headache. She did have a fall two days ago but did not feel like she injure herself in the fall.    Past Medical History:  Diagnosis Date  . Acid reflux disease   . Anemia    history of  . Arthritis    both hands  . Blood transfusion without reported diagnosis   . Cataract    bilateral cateracts removed  . Common migraine   . Diverticulosis    patient denies  . DJD (degenerative joint disease)   . Esophageal stricture   . Herniated disc   . Hiatal hernia 2009   EGD-Small   . History of colonic polyps   . Hyperlipidemia   . Lacunar infarction ([place])   . Macular degeneration   . Osteoporosis   . Sleep apnea    has c-pap but does not wear    Patient Active Problem List   Diagnosis Date Noted  . Postoperative state 02/13/2016  . Chest pain with moderate risk of acute coronary syndrome 10/08/2014  . Sepsis syndrome 06/26/2013  . Acute pyelonephritis 06/26/2013  . Migraine 03/28/2013  . Rectocele 02/13/2013  . Sleep apnea 02/16/2012  . Chronic daily headache 01/12/2012  . OSTEOPENIA 10/17/2008  . ESOPHAGEAL STRICTURE 05/02/2008  . DIVERTICULOSIS-COLON 05/02/2008  . DIVERTICULITIS-COLON 05/02/2008  . ABDOMINAL PAIN-EPIGASTRIC 05/02/2008  . GASTRITIS, ACUTE 03/27/2008  . LEG  CRAMPS, NOCTURNAL 02/10/2008  . Headache(784.0) 02/10/2008  . OTHER DYSPHAGIA 02/10/2008  . HYPERLIPIDEMIA 02/06/2008  . LACUNAR INFARCTION 02/06/2008  . COLONIC POLYPS 10/13/2007  . Migraine without aura 09/27/2007  . Esophageal reflux 09/27/2007  . HERNIATED DISC 09/27/2007    Past Surgical History:  Procedure Laterality Date  . CHOLECYSTECTOMY     4-5 YRS AGO.  Marland Kitchen COLONOSCOPY    . ESOPHAGEAL DILATION    . EYE SURGERY     BILATERAL CATARAT  . INGUINAL HERNIA REPAIR     rt.  Marland Kitchen RECTOCELE REPAIR N/A 02/13/2013   Procedure: POSTERIOR REPAIR (RECTOCELE);  Surgeon: Emily Filbert, MD;  Location: [facility];  Service: Gynecology;  Laterality: N/A;  . ROBOTIC ASSISTED LAPAROSCOPIC LYSIS OF ADHESION  02/13/2016   Procedure: ROBOTIC ASSISTED LAPAROSCOPIC LYSIS OF ADHESION;  Surgeon: Princess Bruins, MD;  Location: [facility];  Service: Gynecology;;  . ROBOTIC ASSISTED LAPAROSCOPIC SACROCOLPOPEXY N/A 02/13/2016   Procedure: ROBOTIC ASSISTED LAPAROSCOPIC SACROCOLPOPEXY With Theola Sequin;  Surgeon: Princess Bruins, MD;  Location: [facility];  Service: Gynecology;  Laterality: N/A;  . TOTAL ABDOMINAL HYSTERECTOMY    . UPPER GASTROINTESTINAL ENDOSCOPY       OB History    Gravida  3   Para  3   Term      Preterm  AB      Living  3     SAB      TAB      Ectopic      Multiple      Live Births              Family History  Problem Relation Age of Onset  . Breast cancer Maternal Aunt   . Colon cancer Maternal Aunt   . Cancer Brother        kidney cancer  . Aneurysm Sister 58       died   . Breast cancer Paternal Aunt   . Esophageal cancer Neg Hx   . Rectal cancer Neg Hx   . Stomach cancer Neg Hx     Social History   Tobacco Use  . Smoking status: Former Smoker    Years: 10.00    Types: Cigarettes  . Smokeless tobacco: Never Used  Substance Use Topics  . Alcohol use: No  . Drug use: No    Home Medications Prior to Admission medications   Medication Sig Start  Date End Date Taking? Authorizing Provider  Abaloparatide (TYMLOS [place]) Inject into the skin daily.    [provider]  acetaminophen (TYLENOL) 500 MG tablet Take 500 mg by mouth every 6 (six) hours as needed.    [provider]  gabapentin (NEURONTIN) 100 MG capsule Take 100 mg by mouth 3 (three) times daily. 01/10/20   [provider]  levETIRAcetam (KEPPRA) 500 MG tablet Take 1 tablet (500 mg total) by mouth 2 (two) times daily. 02/09/20   Quintella Reichert, MD  pantoprazole (PROTONIX) 40 MG tablet Take 40 mg by mouth daily.    [provider]  polyethylene glycol (MIRALAX / GLYCOLAX) packet Take 17 g by mouth 2 (two) times daily as needed. 10/03/18   Virgel Manifold, MD    Allergies    Isometheptene-dichloral-apap and Oxycodone hcl  Review of Systems   Review of Systems  All other systems reviewed and are negative.   Physical Exam Updated Vital Signs BP 114/79   Pulse 93   Temp 98.9 F (37.2 C) (Oral)   Resp 18   Ht 5\' 5"  (1.651 m)   Wt 54.7 kg   SpO2 92%   BMI 20.07 kg/m   Physical Exam Vitals and nursing [name] reviewed.  Constitutional:      Appearance: She is well-developed.  HENT:     Head: Normocephalic and atraumatic.  Cardiovascular:     Rate and Rhythm: Normal rate and regular rhythm.     Heart sounds: No murmur.  Pulmonary:     Effort: Pulmonary effort is normal. No respiratory distress.     Breath sounds: Normal breath sounds.  Abdominal:     Palpations: Abdomen is soft.     Tenderness: There is no abdominal tenderness. There is no guarding or rebound.  Musculoskeletal:        General: No swelling or tenderness.  Skin:    General: Skin is warm and dry.  Neurological:     Mental Status: She is alert.     Comments: Mildly confused.  Slow to answer questions.  Mild left sided facial, LUE, LLE weakness.  Visual fields grossly intact.    Psychiatric:        Behavior: Behavior normal.     ED Results / Procedures / Treatments    Labs (all labs ordered are listed, but only abnormal results are displayed) Labs Reviewed  PROTIME-INR - Abnormal;  Notable for the following components:      Result Value   Prothrombin Time 15.9 (*)    INR 1.3 (*)    All other components within normal limits  DIFFERENTIAL - Abnormal; Notable for the following components:   Monocytes Absolute 1.5 (*)    All other components within normal limits  COMPREHENSIVE METABOLIC PANEL - Abnormal; Notable for the following components:   Glucose, Bld 135 (*)    All other components within normal limits  I-STAT CHEM 8, ED - Abnormal; Notable for the following components:   Glucose, Bld 128 (*)    Calcium, Ion 1.13 (*)    All other components within normal limits  CBG MONITORING, ED - Abnormal; Notable for the following components:   Glucose-Capillary 120 (*)    All other components within normal limits  RESPIRATORY PANEL BY RT PCR (FLU A&B, COVID)  APTT  CBC  URINALYSIS, ROUTINE W REFLEX MICROSCOPIC    EKG EKG Interpretation  Date/Time:  Friday February 09 2020 21:48:03 EST Ventricular Rate:  131 PR Interval:    QRS Duration: 78 QT Interval:  321 QTC Calculation: 474 R Axis:   30 Text Interpretation: Sinus tachycardia with irregular rate Low voltage, precordial leads ST depression, probably rate related Confirmed by Quintella Reichert [phone]) on 02/09/2020 11:40:02 PM   Radiology CT Code Stroke CTA Head W/WO contrast  Result Date: 02/09/2020 CLINICAL DATA:  Subdural and subarachnoid hemorrhage along the right side of the inter cerebral falx. Fall 2 days ago. EXAM: CT ANGIOGRAPHY HEAD AND NECK TECHNIQUE: Multidetector CT imaging of the head and neck was performed using the standard protocol during bolus administration of intravenous contrast. Multiplanar CT image reconstructions and MIPs were obtained to evaluate the vascular anatomy. Carotid stenosis measurements (when applicable) are obtained utilizing NASCET criteria, using the distal  internal carotid diameter as the denominator. CONTRAST:  190mL OMNIPAQUE IOHEXOL 350 MG/ML SOLN COMPARISON:  CT head without contrast 02/09/2020. FINDINGS: CTA NECK FINDINGS Aortic arch: A common origin of the left common carotid artery and innominate artery is noted. Atherosclerotic changes are present at the origin of the subclavian artery without significant stenosis. Additional atherosclerotic changes are present more distal aortic arch without aneurysm. Right carotid system: Right common carotid artery within limits proximally. Atherosclerotic changes are present at the carotid bifurcation without significant stenosis. Mild tortuosity is present cervical right ICA without significant stenosis. Left carotid system: The left common carotid artery is within normal limits proximally. Is some atherosclerotic irregularity in the distal left common carotid artery bifurcation without significant stenosis. Mild tortuosity is present in the cervical left ICA without significant stenosis. Vertebral arteries: Both vertebral arteries originate from the subclavian arteries. The vertebral arteries are codominant. Moderate stenosis is present at the origin of the right vertebral artery. No other significant stenosis is present in either vertebral artery in the neck. Skeleton: C1-2 are fused anteriorly. Is fusion across the C2-3 disc space is well. Multilevel degenerative changes are present. Slight anterolisthesis is present at C7-T1. No focal lytic or blastic lesions are present. Other neck: The soft tissues the neck are otherwise unremarkable. Upper chest: The lung apices are clear. Thoracic inlet is within normal limits. Review of the MIP images confirms the above findings CTA/CTV HEAD FINDINGS Anterior circulation: Atherosclerotic calcifications are present within the cavernous internal carotid arteries bilaterally. Right ICA is narrowed to just over 1 mm at the paraophthalmic segment. This compares with 2.7 mm more  distally. The A1 and M1 segments are normal. The  anterior communicating artery is patent. MCA bifurcations are intact. ACA and MCA branch vessels are within normal limits. Posterior circulation: PICA origins are visualized and normal bilaterally. The vertebrobasilar junction is normal. The basilar artery is normal. Both posterior cerebral arteries originate from the basilar tip. PCA branch vessels are within normal limits. Venous sinuses: Focal filling defect is present the superior sagittal sinus. This is nonocclusive. The dural sinuses otherwise fill normally. The right transverse sinus is dominant. The straight sinus is patent. Cortical veins are unremarkable. Anatomic variants: None Review of the MIP images confirms the above findings IMPRESSION: 1. Focal filling defect within the superior sagittal sinus may represent nonocclusive thrombus or arachnoid granulation. This is unlikely to result in the extra-axial hemorrhage. 2. No other venous thrombus. 3. Normal CTA circle-of-Willis.  No emergent large vessel occlusion. 4. Stable appearance of subdural and subarachnoid hemorrhage. 5. Atherosclerotic changes at the carotid bifurcations bilaterally without significant stenosis. 6.  Aortic Atherosclerosis (ICD10-I70.0). 7. Moderate stenosis at the origin of the right vertebral artery. Electronically Signed   By: San Morelle M.D.   On: 02/09/2020 22:22   CT Code Stroke CTA Neck W/WO contrast  Result Date: 02/09/2020 CLINICAL DATA:  Subdural and subarachnoid hemorrhage along the right side of the inter cerebral falx. Fall 2 days ago. EXAM: CT ANGIOGRAPHY HEAD AND NECK TECHNIQUE: Multidetector CT imaging of the head and neck was performed using the standard protocol during bolus administration of intravenous contrast. Multiplanar CT image reconstructions and MIPs were obtained to evaluate the vascular anatomy. Carotid stenosis measurements (when applicable) are obtained utilizing NASCET criteria, using the  distal internal carotid diameter as the denominator. CONTRAST:  147mL OMNIPAQUE IOHEXOL 350 MG/ML SOLN COMPARISON:  CT head without contrast 02/09/2020. FINDINGS: CTA NECK FINDINGS Aortic arch: A common origin of the left common carotid artery and innominate artery is noted. Atherosclerotic changes are present at the origin of the subclavian artery without significant stenosis. Additional atherosclerotic changes are present more distal aortic arch without aneurysm. Right carotid system: Right common carotid artery within limits proximally. Atherosclerotic changes are present at the carotid bifurcation without significant stenosis. Mild tortuosity is present cervical right ICA without significant stenosis. Left carotid system: The left common carotid artery is within normal limits proximally. Is some atherosclerotic irregularity in the distal left common carotid artery bifurcation without significant stenosis. Mild tortuosity is present in the cervical left ICA without significant stenosis. Vertebral arteries: Both vertebral arteries originate from the subclavian arteries. The vertebral arteries are codominant. Moderate stenosis is present at the origin of the right vertebral artery. No other significant stenosis is present in either vertebral artery in the neck. Skeleton: C1-2 are fused anteriorly. Is fusion across the C2-3 disc space is well. Multilevel degenerative changes are present. Slight anterolisthesis is present at C7-T1. No focal lytic or blastic lesions are present. Other neck: The soft tissues the neck are otherwise unremarkable. Upper chest: The lung apices are clear. Thoracic inlet is within normal limits. Review of the MIP images confirms the above findings CTA/CTV HEAD FINDINGS Anterior circulation: Atherosclerotic calcifications are present within the cavernous internal carotid arteries bilaterally. Right ICA is narrowed to just over 1 mm at the paraophthalmic segment. This compares with 2.7 mm more  distally. The A1 and M1 segments are normal. The anterior communicating artery is patent. MCA bifurcations are intact. ACA and MCA branch vessels are within normal limits. Posterior circulation: PICA origins are visualized and normal bilaterally. The vertebrobasilar junction is normal. The basilar artery is  normal. Both posterior cerebral arteries originate from the basilar tip. PCA branch vessels are within normal limits. Venous sinuses: Focal filling defect is present the superior sagittal sinus. This is nonocclusive. The dural sinuses otherwise fill normally. The right transverse sinus is dominant. The straight sinus is patent. Cortical veins are unremarkable. Anatomic variants: None Review of the MIP images confirms the above findings IMPRESSION: 1. Focal filling defect within the superior sagittal sinus may represent nonocclusive thrombus or arachnoid granulation. This is unlikely to result in the extra-axial hemorrhage. 2. No other venous thrombus. 3. Normal CTA circle-of-Willis.  No emergent large vessel occlusion. 4. Stable appearance of subdural and subarachnoid hemorrhage. 5. Atherosclerotic changes at the carotid bifurcations bilaterally without significant stenosis. 6.  Aortic Atherosclerosis (ICD10-I70.0). 7. Moderate stenosis at the origin of the right vertebral artery. Electronically Signed   By: San Morelle M.D.   On: 02/09/2020 22:22   CT VENOGRAM HEAD  Result Date: 02/09/2020 CLINICAL DATA:  Subdural and subarachnoid hemorrhage along the right side of the inter cerebral falx. Fall 2 days ago. EXAM: CT ANGIOGRAPHY HEAD AND NECK TECHNIQUE: Multidetector CT imaging of the head and neck was performed using the standard protocol during bolus administration of intravenous contrast. Multiplanar CT image reconstructions and MIPs were obtained to evaluate the vascular anatomy. Carotid stenosis measurements (when applicable) are obtained utilizing NASCET criteria, using the distal internal  carotid diameter as the denominator. CONTRAST:  15mL OMNIPAQUE IOHEXOL 350 MG/ML SOLN COMPARISON:  CT head without contrast 02/09/2020. FINDINGS: CTA NECK FINDINGS Aortic arch: A common origin of the left common carotid artery and innominate artery is noted. Atherosclerotic changes are present at the origin of the subclavian artery without significant stenosis. Additional atherosclerotic changes are present more distal aortic arch without aneurysm. Right carotid system: Right common carotid artery within limits proximally. Atherosclerotic changes are present at the carotid bifurcation without significant stenosis. Mild tortuosity is present cervical right ICA without significant stenosis. Left carotid system: The left common carotid artery is within normal limits proximally. Is some atherosclerotic irregularity in the distal left common carotid artery bifurcation without significant stenosis. Mild tortuosity is present in the cervical left ICA without significant stenosis. Vertebral arteries: Both vertebral arteries originate from the subclavian arteries. The vertebral arteries are codominant. Moderate stenosis is present at the origin of the right vertebral artery. No other significant stenosis is present in either vertebral artery in the neck. Skeleton: C1-2 are fused anteriorly. Is fusion across the C2-3 disc space is well. Multilevel degenerative changes are present. Slight anterolisthesis is present at C7-T1. No focal lytic or blastic lesions are present. Other neck: The soft tissues the neck are otherwise unremarkable. Upper chest: The lung apices are clear. Thoracic inlet is within normal limits. Review of the MIP images confirms the above findings CTA/CTV HEAD FINDINGS Anterior circulation: Atherosclerotic calcifications are present within the cavernous internal carotid arteries bilaterally. Right ICA is narrowed to just over 1 mm at the paraophthalmic segment. This compares with 2.7 mm more distally. The  A1 and M1 segments are normal. The anterior communicating artery is patent. MCA bifurcations are intact. ACA and MCA branch vessels are within normal limits. Posterior circulation: PICA origins are visualized and normal bilaterally. The vertebrobasilar junction is normal. The basilar artery is normal. Both posterior cerebral arteries originate from the basilar tip. PCA branch vessels are within normal limits. Venous sinuses: Focal filling defect is present the superior sagittal sinus. This is nonocclusive. The dural sinuses otherwise fill normally. The right transverse  sinus is dominant. The straight sinus is patent. Cortical veins are unremarkable. Anatomic variants: None Review of the MIP images confirms the above findings IMPRESSION: 1. Focal filling defect within the superior sagittal sinus may represent nonocclusive thrombus or arachnoid granulation. This is unlikely to result in the extra-axial hemorrhage. 2. No other venous thrombus. 3. Normal CTA circle-of-Willis.  No emergent large vessel occlusion. 4. Stable appearance of subdural and subarachnoid hemorrhage. 5. Atherosclerotic changes at the carotid bifurcations bilaterally without significant stenosis. 6.  Aortic Atherosclerosis (ICD10-I70.0). 7. Moderate stenosis at the origin of the right vertebral artery. Electronically Signed   By: San Morelle M.D.   On: 02/09/2020 22:22   CT HEAD CODE STROKE WO CONTRAST  Result Date: 02/09/2020 CLINICAL DATA:  Code stroke. Left facial droop. Left-sided weakness. EXAM: CT HEAD WITHOUT CONTRAST TECHNIQUE: Contiguous axial images were obtained from the base of the skull through the vertex without intravenous contrast. COMPARISON:  CT head without contrast 09/19/2004. MR head without contrast 03/02/2007. FINDINGS: Brain: Acute extra-axial hemorrhage is noticed along the right side of the inter cerebral falx. Both subdural and subarachnoid blood are present. No significant intraventricular hemorrhage is  present. No lower hemorrhage is present. Mild diffuse atrophy is present. Moderate white matter changes extend into the anterior limb of the internal capsule. The brainstem and cerebellum are within normal limits. The ventricles are proportionate to the degree of atrophy. Vascular: Atherosclerotic calcifications are present within the cavernous internal carotid arteries bilaterally. No hyperdense vessel is present. Skull: Calvarium is intact. No focal lytic or blastic lesions are present. No significant extracranial soft tissue lesion is present. Sinuses/Orbits: The paranasal sinuses and mastoid air cells are clear. Bilateral lens replacements are noted. Globes and orbits are otherwise unremarkable. ASPECTS [facility] Stroke Program Early CT Score) - Ganglionic level infarction (caudate, lentiform nuclei, internal capsule, insula, M1-M3 cortex): 7/7 - Supraganglionic infarction (M4-M6 cortex): 3/3 Total score (0-10 with 10 being normal): 10/10 IMPRESSION: 1. Acute subarachnoid and subdural hematoma along the right side of the inter cerebral falx effacing the sulci along the posteromedial right frontal lobe. 2. Moderate generalized atrophy and white matter disease. 3. No acute parenchymal infarct. 4. ASPECTS is 10/10 These results were called by telephone at the time of interpretation on 02/09/2020 at 9:32 pm to provider [facility] , who verbally acknowledged these results. Electronically Signed   By: San Morelle M.D.   On: 02/09/2020 21:54    Procedures Procedures (including critical care time)  Medications Ordered in ED Medications  sodium chloride flush (NS) 0.9 % injection 3 mL (3 mLs Intravenous Not Given 02/09/20 2209)  levETIRAcetam (KEPPRA) tablet 500 mg (has no administration in time range)  iohexol (OMNIPAQUE) 350 MG/ML injection 100 mL (100 mLs Intravenous Contrast Given 02/09/20 2147)  labetalol (NORMODYNE) injection 10 mg (10 mg Intravenous Given 02/09/20 2201)  levETIRAcetam (KEPPRA)  IVPB 1000 mg/100 mL premix ( Intravenous Stopped 02/09/20 2320)    ED Course  I have reviewed the triage vital signs and the nursing notes.  Pertinent labs & imaging results that were available during my care of the patient were reviewed by me and considered in my medical decision making (see chart for details).    MDM Rules/Calculators/A&P                     Patient presents to the emergency department as a code stroke for left sided weakness. CT scan of the head demonstrates a subdural hematoma with small amount of subarachnoid  hemorrhage. She was evaluated by Dr. Malen Gauze with neurology, who was concerned for possible seizure and she was treated with Keppra load. She was also seen by Dr. Christella Noa by neurosurgery, who recommends discharge home with follow up as needed. The son would like to take the patient home and feels like she is at her neurologic baseline. On repeat examination her left sided weakness has completely resolved. On discussion with son, she he would prefer that she is discharged home. Discussed outpatient follow-up and return precautions.  Final Clinical Impression(s) / ED Diagnoses Final diagnoses:  SDH (subdural hematoma) (HCC)  Weakness    Rx / DC Orders ED Discharge Orders         Ordered    levETIRAcetam (KEPPRA) 500 MG tablet  2 times daily     02/09/20 2330    Ambulatory referral to Neurology  Status:  Canceled    Comments: An appointment is requested in approximately: 1 week   02/09/20 2338    Ambulatory referral to Neurology    Comments: An appointment is requested in approximately: 1 week   02/09/20 2338           Quintella Reichert, MD 02/10/20 [phone]

## 2020-02-09 NOTE — Code Documentation (Addendum)
Responded to Code Stroke paged out at 2107 for L sided facial droop and L sided weakness, LSN-2000. Pt arrived to [facility] ED at 2119. Per EMS, pt had been c/o headache since this morning. Around 2000, pt developed L sided weakness/facial droop. Of note, per family, pt fell on Wednesday.  NIH-3, CT head-Acute subarachnoid and subdural hematoma along the right side of the inter cerebral falx effacing the sulci along the posteromedial right frontal lobe. CTA and CT venogram pending.  TPA not given d/t hemorrhage. Plan-neurosurgery consult, admit to ICU, keep SBP<140.

## 2020-02-09 NOTE — Consult Note (Signed)
Reason for Consult:falcine subdural Referring Physician: ED  [name] [name] is an 84 y.o. female.  HPI: whom was noted by her son and husband to have weakness on the left side, a facial droop. Head CT showed the subdural. Also noted to fall on Wednesday of this week, though she did not lose consciousness, nor hit her head. Has improved neurologically since arrival. Following all commands.   Past Medical History:  Diagnosis Date  . Acid reflux disease   . Anemia    history of  . Arthritis    both hands  . Blood transfusion without reported diagnosis   . Cataract    bilateral cateracts removed  . Common migraine   . Diverticulosis    patient denies  . DJD (degenerative joint disease)   . Esophageal stricture   . Herniated disc   . Hiatal hernia 2009   EGD-Small   . History of colonic polyps   . Hyperlipidemia   . Lacunar infarction ([place])   . Macular degeneration   . Osteoporosis   . Sleep apnea    has c-pap but does not wear    Past Surgical History:  Procedure Laterality Date  . CHOLECYSTECTOMY     4-5 YRS AGO.  Marland Kitchen COLONOSCOPY    . ESOPHAGEAL DILATION    . EYE SURGERY     BILATERAL CATARAT  . INGUINAL HERNIA REPAIR     rt.  Marland Kitchen RECTOCELE REPAIR N/A 02/13/2013   Procedure: POSTERIOR REPAIR (RECTOCELE);  Surgeon: Emily Filbert, MD;  Location: [facility];  Service: Gynecology;  Laterality: N/A;  . ROBOTIC ASSISTED LAPAROSCOPIC LYSIS OF ADHESION  02/13/2016   Procedure: ROBOTIC ASSISTED LAPAROSCOPIC LYSIS OF ADHESION;  Surgeon: Princess Bruins, MD;  Location: [facility];  Service: Gynecology;;  . ROBOTIC ASSISTED LAPAROSCOPIC SACROCOLPOPEXY N/A 02/13/2016   Procedure: ROBOTIC ASSISTED LAPAROSCOPIC SACROCOLPOPEXY With Theola Sequin;  Surgeon: Princess Bruins, MD;  Location: [facility];  Service: Gynecology;  Laterality: N/A;  . TOTAL ABDOMINAL HYSTERECTOMY    . UPPER GASTROINTESTINAL ENDOSCOPY      Family History  Problem Relation Age of Onset  . Breast cancer Maternal Aunt   .  Colon cancer Maternal Aunt   . Cancer Brother        kidney cancer  . Aneurysm Sister 26       died   . Breast cancer Paternal Aunt   . Esophageal cancer Neg Hx   . Rectal cancer Neg Hx   . Stomach cancer Neg Hx     Social History:  reports that she has quit smoking. Her smoking use included cigarettes. She quit after 10.00 years of use. She has never used smokeless tobacco. She reports that she does not drink alcohol or use drugs.  Allergies:  Allergies  Allergen Reactions  . Isometheptene-Dichloral-Apap Nausea Only    Midrin  . Oxycodone Hcl Nausea And Vomiting and Other (See Comments)    Pt reports tolerating Vicodin    Medications: I have reviewed the patient's current medications.  Results for orders placed or performed during the hospital encounter of 02/09/20 (from the past 48 hour(s))  CBG monitoring, ED     Status: Abnormal   Collection Time: 02/09/20  9:22 PM  Result Value Ref Range   Glucose-Capillary 120 (H) 70 - 99 mg/dL    Comment: Glucose reference range applies only to samples taken after fasting for at least 8 hours.  Protime-INR     Status: Abnormal   Collection Time: 02/09/20  9:25  PM  Result Value Ref Range   Prothrombin Time 15.9 (H) 11.4 - 15.2 seconds   INR 1.3 (H) 0.8 - 1.2    Comment: (NOTE) INR goal varies based on device and disease states. Performed at [facility], [place] [address], [place], [place] [address]   APTT     Status: None   Collection Time: 02/09/20  9:25 PM  Result Value Ref Range   aPTT 34 24 - 36 seconds    Comment: Performed at [facility] [address], [place], [place] [address]  CBC     Status: None   Collection Time: 02/09/20  9:25 PM  Result Value Ref Range   WBC 10.2 4.0 - 10.5 K/uL   RBC 4.70 3.87 - 5.11 MIL/uL   Hemoglobin 13.4 12.0 - 15.0 g/dL   HCT 40.6 36.0 - 46.0 %   MCV 86.4 80.0 - 100.0 fL   MCH 28.5 26.0 - 34.0 pg   MCHC 33.0 30.0 - 36.0 g/dL   RDW 13.3 11.5 - 15.5 %   Platelets 210 150 -  400 K/uL   nRBC 0.0 0.0 - 0.2 %    Comment: Performed at [facility], [place] [address], [place], [place] [address]  Differential     Status: Abnormal   Collection Time: 02/09/20  9:25 PM  Result Value Ref Range   Neutrophils Relative % 64 %   Neutro Abs 6.7 1.7 - 7.7 K/uL   Lymphocytes Relative 18 %   Lymphs Abs 1.8 0.7 - 4.0 K/uL   Monocytes Relative 15 %   Monocytes Absolute 1.5 (H) 0.1 - 1.0 K/uL   Eosinophils Relative 1 %   Eosinophils Absolute 0.1 0.0 - 0.5 K/uL   Basophils Relative 1 %   Basophils Absolute 0.1 0.0 - 0.1 K/uL   Immature Granulocytes 1 %   Abs Immature Granulocytes 0.07 0.00 - 0.07 K/uL    Comment: Performed at [facility] [address] [place], [place] [address]  Comprehensive metabolic panel     Status: Abnormal   Collection Time: 02/09/20  9:25 PM  Result Value Ref Range   Sodium 137 135 - 145 mmol/L   Potassium 3.8 3.5 - 5.1 mmol/L   Chloride 101 98 - 111 mmol/L   CO2 23 22 - 32 mmol/L   Glucose, Bld 135 (H) 70 - 99 mg/dL    Comment: Glucose reference range applies only to samples taken after fasting for at least 8 hours.   BUN 14 8 - 23 mg/dL   Creatinine, Ser 0.87 0.44 - 1.00 mg/dL   Calcium 9.5 8.9 - 10.3 mg/dL   Total Protein 7.2 6.5 - 8.1 g/dL   Albumin 3.9 3.5 - 5.0 g/dL   AST 24 15 - 41 U/L   ALT 17 0 - 44 U/L   Alkaline Phosphatase 69 38 - 126 U/L   Total Bilirubin 0.8 0.3 - 1.2 mg/dL   GFR calc non Af Amer >60 >60 mL/min   GFR calc Af Amer >60 >60 mL/min   Anion gap 13 5 - 15    Comment: Performed at [facility], [place] [address] [place], [place] [address]  I-stat chem 8, ED     Status: Abnormal   Collection Time: 02/09/20  9:54 PM  Result Value Ref Range   Sodium 136 135 - 145 mmol/L   Potassium 3.8 3.5 - 5.1 mmol/L   Chloride 102 98 - 111 mmol/L  BUN 15 8 - 23 mg/dL   Creatinine, Ser 0.70 0.44 - 1.00 mg/dL   Glucose, Bld 128 (H) 70 - 99 mg/dL    Comment: Glucose reference range applies only to samples taken  after fasting for at least 8 hours.   Calcium, Ion 1.13 (L) 1.15 - 1.40 mmol/L   TCO2 29 22 - 32 mmol/L   Hemoglobin 13.6 12.0 - 15.0 g/dL   HCT 40.0 36.0 - 46.0 %  Respiratory Panel by RT PCR (Flu A&B, Covid) - Nasopharyngeal Swab     Status: None   Collection Time: 02/09/20 10:05 PM   Specimen: Nasopharyngeal Swab  Result Value Ref Range   SARS Coronavirus 2 by RT PCR NEGATIVE NEGATIVE    Comment: (NOTE) SARS-CoV-2 target nucleic acids are NOT DETECTED. The SARS-CoV-2 RNA is generally detectable in upper respiratoy specimens during the acute phase of infection. The lowest concentration of SARS-CoV-2 viral copies this assay can detect is 131 copies/mL. A negative result does not preclude SARS-Cov-2 infection and should not be used as the sole basis for treatment or other patient management decisions. A negative result may occur with  improper specimen collection/handling, submission of specimen other than nasopharyngeal swab, presence of viral mutation(s) within the areas targeted by this assay, and inadequate number of viral copies (<131 copies/mL). A negative result must be combined with clinical observations, patient history, and epidemiological information. The expected result is Negative. Fact Sheet for Patients:  PinkCheek.be Fact Sheet for Healthcare Providers:  GravelBags.it This test is not yet ap proved or cleared by the [place] FDA and  has been authorized for detection and/or diagnosis of SARS-CoV-2 by FDA under an Emergency Use Authorization (EUA). This EUA will remain  in effect (meaning this test can be used) for the duration of the COVID-19 declaration under Section 564(b)(1) of the Act, 21 U.S.C. section 360bbb-3(b)(1), unless the authorization is terminated or revoked sooner.    Influenza A by PCR NEGATIVE NEGATIVE   Influenza B by PCR NEGATIVE NEGATIVE    Comment: (NOTE) The Xpert Xpress  SARS-CoV-2/FLU/RSV assay is intended as an aid in  the diagnosis of influenza from Nasopharyngeal swab specimens and  should not be used as a sole basis for treatment. Nasal washings and  aspirates are unacceptable for Xpert Xpress SARS-CoV-2/FLU/RSV  testing. Fact Sheet for Patients: PinkCheek.be Fact Sheet for Healthcare Providers: GravelBags.it This test is not yet approved or cleared by the [place] FDA and  has been authorized for detection and/or diagnosis of SARS-CoV-2 by  FDA under an Emergency Use Authorization (EUA). This EUA will remain  in effect (meaning this test can be used) for the duration of the  Covid-19 declaration under Section 564(b)(1) of the Act, 21  U.S.C. section 360bbb-3(b)(1), unless the authorization is  terminated or revoked. Performed at [facility], [place] [address] [place], [place] [address]     CT Code Stroke CTA Head W/WO contrast  Result Date: 02/09/2020 CLINICAL DATA:  Subdural and subarachnoid hemorrhage along the right side of the inter cerebral falx. Fall 2 days ago. EXAM: CT ANGIOGRAPHY HEAD AND NECK TECHNIQUE: Multidetector CT imaging of the head and neck was performed using the standard protocol during bolus administration of intravenous contrast. Multiplanar CT image reconstructions and MIPs were obtained to evaluate the vascular anatomy. Carotid stenosis measurements (when applicable) are obtained utilizing NASCET criteria, using the distal internal carotid diameter as the denominator. CONTRAST:  163mL OMNIPAQUE IOHEXOL 350 MG/ML SOLN COMPARISON:  CT head  without contrast 02/09/2020. FINDINGS: CTA NECK FINDINGS Aortic arch: A common origin of the left common carotid artery and innominate artery is noted. Atherosclerotic changes are present at the origin of the subclavian artery without significant stenosis. Additional atherosclerotic changes are present more distal aortic arch  without aneurysm. Right carotid system: Right common carotid artery within limits proximally. Atherosclerotic changes are present at the carotid bifurcation without significant stenosis. Mild tortuosity is present cervical right ICA without significant stenosis. Left carotid system: The left common carotid artery is within normal limits proximally. Is some atherosclerotic irregularity in the distal left common carotid artery bifurcation without significant stenosis. Mild tortuosity is present in the cervical left ICA without significant stenosis. Vertebral arteries: Both vertebral arteries originate from the subclavian arteries. The vertebral arteries are codominant. Moderate stenosis is present at the origin of the right vertebral artery. No other significant stenosis is present in either vertebral artery in the neck. Skeleton: C1-2 are fused anteriorly. Is fusion across the C2-3 disc space is well. Multilevel degenerative changes are present. Slight anterolisthesis is present at C7-T1. No focal lytic or blastic lesions are present. Other neck: The soft tissues the neck are otherwise unremarkable. Upper chest: The lung apices are clear. Thoracic inlet is within normal limits. Review of the MIP images confirms the above findings CTA/CTV HEAD FINDINGS Anterior circulation: Atherosclerotic calcifications are present within the cavernous internal carotid arteries bilaterally. Right ICA is narrowed to just over 1 mm at the paraophthalmic segment. This compares with 2.7 mm more distally. The A1 and M1 segments are normal. The anterior communicating artery is patent. MCA bifurcations are intact. ACA and MCA branch vessels are within normal limits. Posterior circulation: PICA origins are visualized and normal bilaterally. The vertebrobasilar junction is normal. The basilar artery is normal. Both posterior cerebral arteries originate from the basilar tip. PCA branch vessels are within normal limits. Venous sinuses: Focal  filling defect is present the superior sagittal sinus. This is nonocclusive. The dural sinuses otherwise fill normally. The right transverse sinus is dominant. The straight sinus is patent. Cortical veins are unremarkable. Anatomic variants: None Review of the MIP images confirms the above findings IMPRESSION: 1. Focal filling defect within the superior sagittal sinus may represent nonocclusive thrombus or arachnoid granulation. This is unlikely to result in the extra-axial hemorrhage. 2. No other venous thrombus. 3. Normal CTA circle-of-Willis.  No emergent large vessel occlusion. 4. Stable appearance of subdural and subarachnoid hemorrhage. 5. Atherosclerotic changes at the carotid bifurcations bilaterally without significant stenosis. 6.  Aortic Atherosclerosis (ICD10-I70.0). 7. Moderate stenosis at the origin of the right vertebral artery. Electronically Signed   By: San Morelle M.D.   On: 02/09/2020 22:22   CT Code Stroke CTA Neck W/WO contrast  Result Date: 02/09/2020 CLINICAL DATA:  Subdural and subarachnoid hemorrhage along the right side of the inter cerebral falx. Fall 2 days ago. EXAM: CT ANGIOGRAPHY HEAD AND NECK TECHNIQUE: Multidetector CT imaging of the head and neck was performed using the standard protocol during bolus administration of intravenous contrast. Multiplanar CT image reconstructions and MIPs were obtained to evaluate the vascular anatomy. Carotid stenosis measurements (when applicable) are obtained utilizing NASCET criteria, using the distal internal carotid diameter as the denominator. CONTRAST:  170mL OMNIPAQUE IOHEXOL 350 MG/ML SOLN COMPARISON:  CT head without contrast 02/09/2020. FINDINGS: CTA NECK FINDINGS Aortic arch: A common origin of the left common carotid artery and innominate artery is noted. Atherosclerotic changes are present at the origin of the subclavian artery without significant  stenosis. Additional atherosclerotic changes are present more distal aortic  arch without aneurysm. Right carotid system: Right common carotid artery within limits proximally. Atherosclerotic changes are present at the carotid bifurcation without significant stenosis. Mild tortuosity is present cervical right ICA without significant stenosis. Left carotid system: The left common carotid artery is within normal limits proximally. Is some atherosclerotic irregularity in the distal left common carotid artery bifurcation without significant stenosis. Mild tortuosity is present in the cervical left ICA without significant stenosis. Vertebral arteries: Both vertebral arteries originate from the subclavian arteries. The vertebral arteries are codominant. Moderate stenosis is present at the origin of the right vertebral artery. No other significant stenosis is present in either vertebral artery in the neck. Skeleton: C1-2 are fused anteriorly. Is fusion across the C2-3 disc space is well. Multilevel degenerative changes are present. Slight anterolisthesis is present at C7-T1. No focal lytic or blastic lesions are present. Other neck: The soft tissues the neck are otherwise unremarkable. Upper chest: The lung apices are clear. Thoracic inlet is within normal limits. Review of the MIP images confirms the above findings CTA/CTV HEAD FINDINGS Anterior circulation: Atherosclerotic calcifications are present within the cavernous internal carotid arteries bilaterally. Right ICA is narrowed to just over 1 mm at the paraophthalmic segment. This compares with 2.7 mm more distally. The A1 and M1 segments are normal. The anterior communicating artery is patent. MCA bifurcations are intact. ACA and MCA branch vessels are within normal limits. Posterior circulation: PICA origins are visualized and normal bilaterally. The vertebrobasilar junction is normal. The basilar artery is normal. Both posterior cerebral arteries originate from the basilar tip. PCA branch vessels are within normal limits. Venous sinuses:  Focal filling defect is present the superior sagittal sinus. This is nonocclusive. The dural sinuses otherwise fill normally. The right transverse sinus is dominant. The straight sinus is patent. Cortical veins are unremarkable. Anatomic variants: None Review of the MIP images confirms the above findings IMPRESSION: 1. Focal filling defect within the superior sagittal sinus may represent nonocclusive thrombus or arachnoid granulation. This is unlikely to result in the extra-axial hemorrhage. 2. No other venous thrombus. 3. Normal CTA circle-of-Willis.  No emergent large vessel occlusion. 4. Stable appearance of subdural and subarachnoid hemorrhage. 5. Atherosclerotic changes at the carotid bifurcations bilaterally without significant stenosis. 6.  Aortic Atherosclerosis (ICD10-I70.0). 7. Moderate stenosis at the origin of the right vertebral artery. Electronically Signed   By: San Morelle M.D.   On: 02/09/2020 22:22   CT VENOGRAM HEAD  Result Date: 02/09/2020 CLINICAL DATA:  Subdural and subarachnoid hemorrhage along the right side of the inter cerebral falx. Fall 2 days ago. EXAM: CT ANGIOGRAPHY HEAD AND NECK TECHNIQUE: Multidetector CT imaging of the head and neck was performed using the standard protocol during bolus administration of intravenous contrast. Multiplanar CT image reconstructions and MIPs were obtained to evaluate the vascular anatomy. Carotid stenosis measurements (when applicable) are obtained utilizing NASCET criteria, using the distal internal carotid diameter as the denominator. CONTRAST:  116mL OMNIPAQUE IOHEXOL 350 MG/ML SOLN COMPARISON:  CT head without contrast 02/09/2020. FINDINGS: CTA NECK FINDINGS Aortic arch: A common origin of the left common carotid artery and innominate artery is noted. Atherosclerotic changes are present at the origin of the subclavian artery without significant stenosis. Additional atherosclerotic changes are present more distal aortic arch without  aneurysm. Right carotid system: Right common carotid artery within limits proximally. Atherosclerotic changes are present at the carotid bifurcation without significant stenosis. Mild tortuosity is present cervical right  ICA without significant stenosis. Left carotid system: The left common carotid artery is within normal limits proximally. Is some atherosclerotic irregularity in the distal left common carotid artery bifurcation without significant stenosis. Mild tortuosity is present in the cervical left ICA without significant stenosis. Vertebral arteries: Both vertebral arteries originate from the subclavian arteries. The vertebral arteries are codominant. Moderate stenosis is present at the origin of the right vertebral artery. No other significant stenosis is present in either vertebral artery in the neck. Skeleton: C1-2 are fused anteriorly. Is fusion across the C2-3 disc space is well. Multilevel degenerative changes are present. Slight anterolisthesis is present at C7-T1. No focal lytic or blastic lesions are present. Other neck: The soft tissues the neck are otherwise unremarkable. Upper chest: The lung apices are clear. Thoracic inlet is within normal limits. Review of the MIP images confirms the above findings CTA/CTV HEAD FINDINGS Anterior circulation: Atherosclerotic calcifications are present within the cavernous internal carotid arteries bilaterally. Right ICA is narrowed to just over 1 mm at the paraophthalmic segment. This compares with 2.7 mm more distally. The A1 and M1 segments are normal. The anterior communicating artery is patent. MCA bifurcations are intact. ACA and MCA branch vessels are within normal limits. Posterior circulation: PICA origins are visualized and normal bilaterally. The vertebrobasilar junction is normal. The basilar artery is normal. Both posterior cerebral arteries originate from the basilar tip. PCA branch vessels are within normal limits. Venous sinuses: Focal filling  defect is present the superior sagittal sinus. This is nonocclusive. The dural sinuses otherwise fill normally. The right transverse sinus is dominant. The straight sinus is patent. Cortical veins are unremarkable. Anatomic variants: None Review of the MIP images confirms the above findings IMPRESSION: 1. Focal filling defect within the superior sagittal sinus may represent nonocclusive thrombus or arachnoid granulation. This is unlikely to result in the extra-axial hemorrhage. 2. No other venous thrombus. 3. Normal CTA circle-of-Willis.  No emergent large vessel occlusion. 4. Stable appearance of subdural and subarachnoid hemorrhage. 5. Atherosclerotic changes at the carotid bifurcations bilaterally without significant stenosis. 6.  Aortic Atherosclerosis (ICD10-I70.0). 7. Moderate stenosis at the origin of the right vertebral artery. Electronically Signed   By: San Morelle M.D.   On: 02/09/2020 22:22   CT HEAD CODE STROKE WO CONTRAST  Result Date: 02/09/2020 CLINICAL DATA:  Code stroke. Left facial droop. Left-sided weakness. EXAM: CT HEAD WITHOUT CONTRAST TECHNIQUE: Contiguous axial images were obtained from the base of the skull through the vertex without intravenous contrast. COMPARISON:  CT head without contrast 09/19/2004. MR head without contrast 03/02/2007. FINDINGS: Brain: Acute extra-axial hemorrhage is noticed along the right side of the inter cerebral falx. Both subdural and subarachnoid blood are present. No significant intraventricular hemorrhage is present. No lower hemorrhage is present. Mild diffuse atrophy is present. Moderate white matter changes extend into the anterior limb of the internal capsule. The brainstem and cerebellum are within normal limits. The ventricles are proportionate to the degree of atrophy. Vascular: Atherosclerotic calcifications are present within the cavernous internal carotid arteries bilaterally. No hyperdense vessel is present. Skull: Calvarium is intact.  No focal lytic or blastic lesions are present. No significant extracranial soft tissue lesion is present. Sinuses/Orbits: The paranasal sinuses and mastoid air cells are clear. Bilateral lens replacements are noted. Globes and orbits are otherwise unremarkable. ASPECTS [facility] Stroke Program Early CT Score) - Ganglionic level infarction (caudate, lentiform nuclei, internal capsule, insula, M1-M3 cortex): 7/7 - Supraganglionic infarction (M4-M6 cortex): 3/3 Total score (0-10 with 10  being normal): 10/10 IMPRESSION: 1. Acute subarachnoid and subdural hematoma along the right side of the inter cerebral falx effacing the sulci along the posteromedial right frontal lobe. 2. Moderate generalized atrophy and white matter disease. 3. No acute parenchymal infarct. 4. ASPECTS is 10/10 These results were called by telephone at the time of interpretation on 02/09/2020 at 9:32 pm to provider [facility] , who verbally acknowledged these results. Electronically Signed   By: San Morelle M.D.   On: 02/09/2020 21:54    Review of Systems  Eyes:       Macular degeneration  Neurological: Positive for weakness (left side, now resolvede).   Blood pressure 129/83, pulse 95, temperature 98.9 F (37.2 C), temperature source Oral, resp. rate (!) 30, height 5\' 5"  (1.651 m), weight 54.7 kg, SpO2 91 %. Physical Exam  Constitutional: She is oriented to person, place, and time. She appears well-developed and well-nourished. No distress.  HENT:  Head: Normocephalic.  Right Ear: External ear normal.  Left Ear: External ear normal.  Eyes: Pupils are equal, round, and reactive to light.  Decreased vision  Neck:  Pain with some movement  Cardiovascular: Normal rate, regular rhythm and normal heart sounds.  Respiratory: Effort normal and breath sounds normal.  Neurological: She is alert and oriented to person, place, and time. A cranial nerve deficit is present.  Gait not assessed  Skin: Skin is warm and dry.   Psychiatric: She has a normal mood and affect. Her behavior is normal. Judgment and thought content normal.    Assessment/Plan: Falcine subdural, son who is a physician states she is back to baseline mentally, and with strength. No need for operative intervention. Following all commands. Believe it is ok to discharge home for observation.   Ashok Pall 02/09/2020, 11:03 PM

## 2020-02-09 NOTE — Consult Note (Addendum)
Neurology Consultation  Reason for Consult: Code stroke for left-sided weakness Referring Physician: Dr. Hazle Coca  CC: Left facial droop left-sided weakness  History is obtained from: Chart review, son Dr. Lewanda Rife over the phone  HPI: [name] [name] is a 84 y.o. female past medical history of macular degeneration, hyperlipidemia, esophageal strictures, sleep apnea, at baseline walking with support and requiring some help with ADLs, lives with husband at home, presented for evaluation of left facial droop noted by family last known normal at around 8 PM. Got some more information over the phone from the son.  She has been complaining of a headache since the morning but this evening is when the family noted the left facial droop and some slurred speech along with left arm and leg weakness.  She had a fall on Wednesday, unclear if she hit her head or not but had been complaining of head discomfort since then but it became much more troublesome since this morning with headache. EMS noted her to have left facial droop that resolved on the way to the hospital.  She continued to be weak on the left arm and leg. Also report of shaking at home. Unclear if clear GTCS or focal seizure. My initial evaluation in the emergency room was NIH stroke scale of three. CT head done stat as a part of the code stroke revealed a parafalcine subdural hematoma along with mild subarachnoid blood centered towards the right. CT angio head and CT venogram was ordered.   LKW: 8 PM on 02/09/2020 although she had a fall on Wednesday, 02/07/2020 and has a headache since then. tpa given?: no, subdural hematoma Premorbid modified Rankin scale (mRS):3  ROS: ROS was performed and is negative except as noted in the HPI.  Past Medical History:  Diagnosis Date  . Acid reflux disease   . Anemia    history of  . Arthritis    both hands  . Blood transfusion without reported diagnosis   . Cataract    bilateral cateracts removed    . Common migraine   . Diverticulosis    patient denies  . DJD (degenerative joint disease)   . Esophageal stricture   . Herniated disc   . Hiatal hernia 2009   EGD-Small   . History of colonic polyps   . Hyperlipidemia   . Lacunar infarction ([place])   . Macular degeneration   . Osteoporosis   . Sleep apnea    has c-pap but does not wear    Family History  Problem Relation Age of Onset  . Breast cancer Maternal Aunt   . Colon cancer Maternal Aunt   . Cancer Brother        kidney cancer  . Aneurysm Sister 52       died   . Breast cancer Paternal Aunt   . Esophageal cancer Neg Hx   . Rectal cancer Neg Hx   . Stomach cancer Neg Hx    Social History:   reports that she has quit smoking. Her smoking use included cigarettes. She quit after 10.00 years of use. She has never used smokeless tobacco. She reports that she does not drink alcohol or use drugs.  Medications  Current Facility-Administered Medications:  .  sodium chloride flush (NS) 0.9 % injection 3 mL, 3 mL, Intravenous, Once, Quintella Reichert, MD  Current Outpatient Medications:  .  Abaloparatide (TYMLOS [place]), Inject into the skin daily., Disp: , Rfl:  .  acetaminophen (TYLENOL) 500 MG tablet, Take 500  mg by mouth every 6 (six) hours as needed., Disp: , Rfl:  .  pantoprazole (PROTONIX) 40 MG tablet, Take 40 mg by mouth daily., Disp: , Rfl:  .  polyethylene glycol (MIRALAX / GLYCOLAX) packet, Take 17 g by mouth 2 (two) times daily as needed. (Patient not taking: Reported on 06/15/2019), Disp: 14 each, Rfl: 0   Exam: Current vital signs: BP (!) 156/92 (BP Location: Right Arm)   Pulse (!) 110   Temp 98.9 F (37.2 C) (Oral)   Resp (!) 33   Ht 5\' 5"  (1.651 m)   Wt 54.7 kg   SpO2 95%   BMI 20.07 kg/m  Vital signs in last 24 hours: Temp:  [98.9 F (37.2 C)] 98.9 F (37.2 C) (03/12 2135) Pulse Rate:  [110] 110 (03/12 2135) Resp:  [33] 33 (03/12 2135) BP: (156)/(92) 156/92 (03/12 2135) SpO2:  [95 %] 95 % (03/12  2135) Weight:  [54.7 kg] 54.7 kg (03/12 2153) General: Awake alert in no distress HEENT: Cephalic atraumatic Lungs: Clear to auscultation Cardiovascular: S1-S2, RRR Extremities warm well perfused Neurological exam Mental status: Awake alert oriented x3. Speech is mildly dysarthric No evidence of aphasia Cranial: Pupils equal round react light, extraocular movements appear intact, visual fields appear full to confrontation although she has extremely poor visual acuity bilaterally, face appears symmetric. Motor exam: There is left upper and lower extremity drift with 3-4/5 strength in the upper and lower extremity on the left.  Right side is full strength. Sensory exam: Diminished on the left. Coordination: Difficult to assess NIH stroke scale-3  Labs I have reviewed labs in epic and the results pertinent to this consultation are: CBC    Component Value Date/Time   WBC 7.6 09/26/2018 1104   RBC 5.03 09/26/2018 1104   HGB 14.2 09/26/2018 1104   HCT 42.0 09/26/2018 1104   PLT 262.0 09/26/2018 1104   MCV 83.4 09/26/2018 1104   MCH 27.4 07/24/2017 2115   MCHC 33.9 09/26/2018 1104   RDW 14.3 09/26/2018 1104   LYMPHSABS 1.5 09/26/2018 1104   MONOABS 0.9 09/26/2018 1104   EOSABS 0.2 09/26/2018 1104   BASOSABS 0.1 09/26/2018 1104    CMP     Component Value Date/Time   NA 137 09/26/2018 1104   K 4.5 09/26/2018 1104   CL 98 09/26/2018 1104   CO2 29 09/26/2018 1104   GLUCOSE 87 09/26/2018 1104   BUN 11 09/26/2018 1104   CREATININE 0.82 09/26/2018 1104   CALCIUM 9.7 09/26/2018 1104   PROT 7.9 02/05/2016 1105   ALBUMIN 4.5 02/05/2016 1105   AST 19 02/05/2016 1105   ALT 14 02/05/2016 1105   ALKPHOS 67 02/05/2016 1105   BILITOT 0.6 02/05/2016 1105   GFRNONAA >60 07/24/2017 2115   GFRAA >60 07/24/2017 2115    Lipid Panel     Component Value Date/Time   CHOL 156 10/09/2014 0318   TRIG 161 (H) 10/09/2014 0318   HDL 43 10/09/2014 0318   CHOLHDL 3.6 10/09/2014 0318   VLDL  32 10/09/2014 0318   LDLCALC 81 10/09/2014 0318     Imaging I have reviewed the images obtained:  CT-scan of the brain-parafalcine subdural central to the right with trace subarachnoid.    Assessment: 84 year old with above past medical history presenting for left-sided weakness evaluation.  This has been preceded by headache since this morning and a fall on Wednesday. Imaging reveals a parafalcine subdural hematoma likely traumatic. Not on antiplatelets or anticoagulation so no need for reversal.  Blood pressures in the [id number] range systolic.  Needs blood pressure control and neurosurgical consultation.  Impression: Traumatic subdural hematoma and traumatic subarachnoid hemorrhage ?shaking concerning for possible seizure  Recommendations: -No antiplatelets anticoagulants -Systolic blood pressure less than 140.  Ordered one dose of labetalol followed by Cleviprex drip if needed. -Neurosurgical consultation -Frequent neurochecks -Telemetry -Repeat CT head in 24h. -Load Keppra 1g IV now given h/o possible seizure and transient facial weakness (could have been post ictal Todds paralysis). Follow with Keppra 500 BID _Routine EEG in the AM -Spoke with the son, who is a physician, and updated him with the plan. -Discussed my plan with Dr. Ralene Bathe in the ER. -Stroke neurology will be available as needed.  Please call with questions. Follow up CTV  CRITICAL CARE ATTESTATION Performed by: Amie Portland, MD Total critical care time: 50 minutes Critical care time was exclusive of separately billable procedures and treating other patients and/or supervising APPs/Residents/Students Critical care was necessary to treat or prevent imminent or life-threatening deterioration due to traumatic SDH  This patient is critically ill and at significant risk for neurological worsening and/or death and care requires constant monitoring. Critical care was time spent personally by me on the following  activities: development of treatment plan with patient and/or surrogate as well as nursing, discussions with consultants, evaluation of patient's response to treatment, examination of patient, obtaining history from patient or surrogate, ordering and performing treatments and interventions, ordering and review of laboratory studies, ordering and review of radiographic studies, pulse oximetry, re-evaluation of patient's condition, participation in multidisciplinary rounds and medical decision making of high complexity in the care of this patient.   -- Amie Portland, MD [facility] Pager: [phone] If 7pm to 7am, please call on call as listed on AMION.   ADDENDUM CTA with no ELVO. CTV with patent sinuses except a small filling defect anterior sup sag sinus, non occlussive, could be small thrombus vs arachnoid granulations.  Given reliable trauma history, and CT appearance c/w  SDH being more consistent with trauma, and not with a venous infarct with HT, will not anticoagulate. -- Amie Portland, MD [facility] Pager: [phone] If 7pm to 7am, please call on call as listed on AMION.

## 2020-02-10 ENCOUNTER — Telehealth (HOSPITAL_COMMUNITY): Payer: Self-pay | Admitting: Emergency Medicine

## 2020-02-10 NOTE — TOC Initial Note (Addendum)
02/10/2020 1130 am  Transition of Care [facility]) - Initial/Assessment Note    Patient Details  Name: [name] [name] MRN: [id number] Date of Birth: Feb 09, 1936  Transition of Care [facility]) CM/SW Contact:    Erenest Rasher, RN Phone Number: [phone] 02/10/2020, 12:34 PM  Clinical Narrative:                  TOC CM received referral to arrange [facility]. Noted pt was dc on 02/10/2020 at 11:58 pm, no HH entered at time of dc. Contacted pt's husband, [name] [name]. States pt is having difficulty with walking and he feels she will do better with HH coming out today to assist them in the home. TOC CM explained HH and SNF rehab. Explained Medicare has 24-48 hour start of care time for [facility] agency. And that [facility] agency most likely would come out on Monday at the earliest. Husband states he prefers SNF rehab. Explained process for arrange SNF placement from home setting takes 3-5 business days. Educated husband that if he feels her condition has declined or worsened he should seek medical attention. Husband requested CM follow up with son, [name] [name]. Contacted son and discussed [place] vs SNF and return to ED. He will discuss with family and give CM call back.   12:07 pm Received call back and they decided on [facility]. Explained CM will work on getting [place] order. Contacted ED provider, [name] Tamera Punt for [facility] orders. States she will follow up with son, [name] [name] to discuss pt's condition prior to giving orders for [facility]. Waiting call back for ED provider for further instructions. Updated TOC Director, Nathaniel Man.    [place] contacted [facility] start of care is for 02/16/2020. Contacted [facility] rep, Tommi Rumps and they can arrange soc for 02/11/2020 or 02/12/2020. CM updated son with HH. Will provide contact information for [facility].    Expected Discharge Plan: [place] Barriers to Discharge: No Barriers Identified   Patient Goals and CMS Choice Patient states their goals for this hospitalization and ongoing recovery  are:: patient getting weaker CMS Medicare.gov Compare Post Acute Care list provided to:: Patient Represenative (must comment)([name] [name]-son) Choice offered to / list presented to : Adult Children  Expected Discharge Plan and Services Expected Discharge Plan: [place] In-house Referral: Clinical Social Work Discharge Planning Services: CM Consult Post Acute Care Choice: [place] arrangements for the past 2 months: [place]                               Date [place]: 02/10/20 Time [place]: 1221 Representative spoke with at [place]: Graciella Freer RN  Prior Living Arrangements/Services Living arrangements for the past 2 months: [place] with:: Spouse Patient language and need for interpreter reviewed:: Yes        Need for Family Participation in Patient Care: Yes (Comment) Care giver support system in place?: Yes (comment) Current home services: DME(rolling walker) Criminal Activity/Legal Involvement Pertinent to Current Situation/Hospitalization: No - Comment as needed  Activities of Daily Living      Permission Sought/Granted Permission sought to share information with : Case Manager, Family Supports, PCP, Customer service manager Permission granted to share information with : Yes, Verbal Permission Granted  Share Information with NAME: [name] [name], [name] Consuella Lose  Permission granted to share info w AGENCY: [place], SNF  Permission granted to share info w Relationship: husband  Permission granted to share info w Contact Information: [id number]  Emotional Assessment           Psych Involvement: No (comment)  Admission diagnosis:  CODE STROKE Patient Active Problem List   Diagnosis Date Noted  . Postoperative state 02/13/2016  . Chest pain with moderate risk of acute coronary syndrome 10/08/2014  . Sepsis syndrome 06/26/2013  . Acute pyelonephritis 06/26/2013  .  Migraine 03/28/2013  . Rectocele 02/13/2013  . Sleep apnea 02/16/2012  . Chronic daily headache 01/12/2012  . OSTEOPENIA 10/17/2008  . ESOPHAGEAL STRICTURE 05/02/2008  . DIVERTICULOSIS-COLON 05/02/2008  . DIVERTICULITIS-COLON 05/02/2008  . ABDOMINAL PAIN-EPIGASTRIC 05/02/2008  . GASTRITIS, ACUTE 03/27/2008  . LEG CRAMPS, NOCTURNAL 02/10/2008  . Headache(784.0) 02/10/2008  . OTHER DYSPHAGIA 02/10/2008  . HYPERLIPIDEMIA 02/06/2008  . LACUNAR INFARCTION 02/06/2008  . COLONIC POLYPS 10/13/2007  . Migraine without aura 09/27/2007  . Esophageal reflux 09/27/2007  . HERNIATED DISC 09/27/2007   PCP:  Marton Redwood, MD Pharmacy:   [facility] [place], [place] - [place] [address] AT [address] [place] [address] Phone: [phone] Fax: [phone]     Social Determinants of Health (SDOH) Interventions    Readmission Risk Interventions No flowsheet data found.

## 2020-02-10 NOTE — Telephone Encounter (Signed)
Patient was seen last night and diagnosed with subarachnoid hemorrhage/subdural hematoma.  She was deemed appropriate for discharge.  The flow manager contacted me about ordering home health services because the family said that the patient was very weak.  I contacted the patient's son, Dr. Lewanda [name], as well as the patient's daughter-in-law.  They state that she does not seem to be markedly different than yesterday although she is generally weak.  They are not sure how much aggressive intervention she would want even if her symptoms had progressed.  They recommended discuss it.  I did advise them that if the patient has any change in symptoms or worsening that she has to go back to the emergency department to have a repeat evaluation and probable CT scan.  I will go ahead and order home health services however.

## 2020-02-11 DIAGNOSIS — M19042 Primary osteoarthritis, left hand: Secondary | ICD-10-CM | POA: Diagnosis not present

## 2020-02-11 DIAGNOSIS — S065X0D Traumatic subdural hemorrhage without loss of consciousness, subsequent encounter: Secondary | ICD-10-CM | POA: Diagnosis not present

## 2020-02-11 DIAGNOSIS — M4313 Spondylolisthesis, cervicothoracic region: Secondary | ICD-10-CM | POA: Diagnosis not present

## 2020-02-11 DIAGNOSIS — I10 Essential (primary) hypertension: Secondary | ICD-10-CM | POA: Diagnosis not present

## 2020-02-11 DIAGNOSIS — G8194 Hemiplegia, unspecified affecting left nondominant side: Secondary | ICD-10-CM | POA: Diagnosis not present

## 2020-02-11 DIAGNOSIS — S066X0D Traumatic subarachnoid hemorrhage without loss of consciousness, subsequent encounter: Secondary | ICD-10-CM | POA: Diagnosis not present

## 2020-02-11 DIAGNOSIS — M19041 Primary osteoarthritis, right hand: Secondary | ICD-10-CM | POA: Diagnosis not present

## 2020-02-11 DIAGNOSIS — M8580 Other specified disorders of bone density and structure, unspecified site: Secondary | ICD-10-CM | POA: Diagnosis not present

## 2020-02-11 DIAGNOSIS — M810 Age-related osteoporosis without current pathological fracture: Secondary | ICD-10-CM | POA: Diagnosis not present

## 2020-02-14 DIAGNOSIS — I609 Nontraumatic subarachnoid hemorrhage, unspecified: Secondary | ICD-10-CM | POA: Diagnosis not present

## 2020-02-14 DIAGNOSIS — M7989 Other specified soft tissue disorders: Secondary | ICD-10-CM | POA: Diagnosis not present

## 2020-02-14 DIAGNOSIS — R569 Unspecified convulsions: Secondary | ICD-10-CM | POA: Diagnosis not present

## 2020-02-14 DIAGNOSIS — J189 Pneumonia, unspecified organism: Secondary | ICD-10-CM | POA: Diagnosis not present

## 2020-02-14 DIAGNOSIS — I6200 Nontraumatic subdural hemorrhage, unspecified: Secondary | ICD-10-CM | POA: Diagnosis not present

## 2020-02-14 DIAGNOSIS — G40909 Epilepsy, unspecified, not intractable, without status epilepticus: Secondary | ICD-10-CM | POA: Diagnosis not present

## 2020-02-15 DIAGNOSIS — M19042 Primary osteoarthritis, left hand: Secondary | ICD-10-CM | POA: Diagnosis not present

## 2020-02-15 DIAGNOSIS — S066X0D Traumatic subarachnoid hemorrhage without loss of consciousness, subsequent encounter: Secondary | ICD-10-CM | POA: Diagnosis not present

## 2020-02-15 DIAGNOSIS — M19041 Primary osteoarthritis, right hand: Secondary | ICD-10-CM | POA: Diagnosis not present

## 2020-02-15 DIAGNOSIS — G8194 Hemiplegia, unspecified affecting left nondominant side: Secondary | ICD-10-CM | POA: Diagnosis not present

## 2020-02-15 DIAGNOSIS — M810 Age-related osteoporosis without current pathological fracture: Secondary | ICD-10-CM | POA: Diagnosis not present

## 2020-02-15 DIAGNOSIS — M4313 Spondylolisthesis, cervicothoracic region: Secondary | ICD-10-CM | POA: Diagnosis not present

## 2020-02-15 DIAGNOSIS — M8580 Other specified disorders of bone density and structure, unspecified site: Secondary | ICD-10-CM | POA: Diagnosis not present

## 2020-02-15 DIAGNOSIS — S065X0D Traumatic subdural hemorrhage without loss of consciousness, subsequent encounter: Secondary | ICD-10-CM | POA: Diagnosis not present

## 2020-02-15 DIAGNOSIS — I10 Essential (primary) hypertension: Secondary | ICD-10-CM | POA: Diagnosis not present

## 2020-02-16 DIAGNOSIS — I10 Essential (primary) hypertension: Secondary | ICD-10-CM | POA: Diagnosis not present

## 2020-02-16 DIAGNOSIS — S066X0D Traumatic subarachnoid hemorrhage without loss of consciousness, subsequent encounter: Secondary | ICD-10-CM | POA: Diagnosis not present

## 2020-02-16 DIAGNOSIS — M4313 Spondylolisthesis, cervicothoracic region: Secondary | ICD-10-CM | POA: Diagnosis not present

## 2020-02-16 DIAGNOSIS — M810 Age-related osteoporosis without current pathological fracture: Secondary | ICD-10-CM | POA: Diagnosis not present

## 2020-02-16 DIAGNOSIS — M19042 Primary osteoarthritis, left hand: Secondary | ICD-10-CM | POA: Diagnosis not present

## 2020-02-16 DIAGNOSIS — M19041 Primary osteoarthritis, right hand: Secondary | ICD-10-CM | POA: Diagnosis not present

## 2020-02-16 DIAGNOSIS — S065X0D Traumatic subdural hemorrhage without loss of consciousness, subsequent encounter: Secondary | ICD-10-CM | POA: Diagnosis not present

## 2020-02-16 DIAGNOSIS — M8580 Other specified disorders of bone density and structure, unspecified site: Secondary | ICD-10-CM | POA: Diagnosis not present

## 2020-02-16 DIAGNOSIS — G8194 Hemiplegia, unspecified affecting left nondominant side: Secondary | ICD-10-CM | POA: Diagnosis not present

## 2020-02-17 DIAGNOSIS — M19042 Primary osteoarthritis, left hand: Secondary | ICD-10-CM | POA: Diagnosis not present

## 2020-02-17 DIAGNOSIS — M4313 Spondylolisthesis, cervicothoracic region: Secondary | ICD-10-CM | POA: Diagnosis not present

## 2020-02-17 DIAGNOSIS — M810 Age-related osteoporosis without current pathological fracture: Secondary | ICD-10-CM | POA: Diagnosis not present

## 2020-02-17 DIAGNOSIS — S066X0D Traumatic subarachnoid hemorrhage without loss of consciousness, subsequent encounter: Secondary | ICD-10-CM | POA: Diagnosis not present

## 2020-02-17 DIAGNOSIS — M8580 Other specified disorders of bone density and structure, unspecified site: Secondary | ICD-10-CM | POA: Diagnosis not present

## 2020-02-17 DIAGNOSIS — I10 Essential (primary) hypertension: Secondary | ICD-10-CM | POA: Diagnosis not present

## 2020-02-17 DIAGNOSIS — M19041 Primary osteoarthritis, right hand: Secondary | ICD-10-CM | POA: Diagnosis not present

## 2020-02-17 DIAGNOSIS — G8194 Hemiplegia, unspecified affecting left nondominant side: Secondary | ICD-10-CM | POA: Diagnosis not present

## 2020-02-17 DIAGNOSIS — S065X0D Traumatic subdural hemorrhage without loss of consciousness, subsequent encounter: Secondary | ICD-10-CM | POA: Diagnosis not present

## 2020-02-19 DIAGNOSIS — S066X0D Traumatic subarachnoid hemorrhage without loss of consciousness, subsequent encounter: Secondary | ICD-10-CM | POA: Diagnosis not present

## 2020-02-19 DIAGNOSIS — M8580 Other specified disorders of bone density and structure, unspecified site: Secondary | ICD-10-CM | POA: Diagnosis not present

## 2020-02-19 DIAGNOSIS — M19041 Primary osteoarthritis, right hand: Secondary | ICD-10-CM | POA: Diagnosis not present

## 2020-02-19 DIAGNOSIS — M810 Age-related osteoporosis without current pathological fracture: Secondary | ICD-10-CM | POA: Diagnosis not present

## 2020-02-19 DIAGNOSIS — G8194 Hemiplegia, unspecified affecting left nondominant side: Secondary | ICD-10-CM | POA: Diagnosis not present

## 2020-02-19 DIAGNOSIS — M19042 Primary osteoarthritis, left hand: Secondary | ICD-10-CM | POA: Diagnosis not present

## 2020-02-19 DIAGNOSIS — I10 Essential (primary) hypertension: Secondary | ICD-10-CM | POA: Diagnosis not present

## 2020-02-19 DIAGNOSIS — M4313 Spondylolisthesis, cervicothoracic region: Secondary | ICD-10-CM | POA: Diagnosis not present

## 2020-02-19 DIAGNOSIS — S065X0D Traumatic subdural hemorrhage without loss of consciousness, subsequent encounter: Secondary | ICD-10-CM | POA: Diagnosis not present

## 2020-02-21 DIAGNOSIS — S066X0D Traumatic subarachnoid hemorrhage without loss of consciousness, subsequent encounter: Secondary | ICD-10-CM | POA: Diagnosis not present

## 2020-02-21 DIAGNOSIS — I10 Essential (primary) hypertension: Secondary | ICD-10-CM | POA: Diagnosis not present

## 2020-02-21 DIAGNOSIS — G8194 Hemiplegia, unspecified affecting left nondominant side: Secondary | ICD-10-CM | POA: Diagnosis not present

## 2020-02-21 DIAGNOSIS — M19042 Primary osteoarthritis, left hand: Secondary | ICD-10-CM | POA: Diagnosis not present

## 2020-02-21 DIAGNOSIS — M4313 Spondylolisthesis, cervicothoracic region: Secondary | ICD-10-CM | POA: Diagnosis not present

## 2020-02-21 DIAGNOSIS — M810 Age-related osteoporosis without current pathological fracture: Secondary | ICD-10-CM | POA: Diagnosis not present

## 2020-02-21 DIAGNOSIS — M19041 Primary osteoarthritis, right hand: Secondary | ICD-10-CM | POA: Diagnosis not present

## 2020-02-21 DIAGNOSIS — M8580 Other specified disorders of bone density and structure, unspecified site: Secondary | ICD-10-CM | POA: Diagnosis not present

## 2020-02-21 DIAGNOSIS — S065X0D Traumatic subdural hemorrhage without loss of consciousness, subsequent encounter: Secondary | ICD-10-CM | POA: Diagnosis not present

## 2020-02-22 DIAGNOSIS — I10 Essential (primary) hypertension: Secondary | ICD-10-CM | POA: Diagnosis not present

## 2020-02-22 DIAGNOSIS — K219 Gastro-esophageal reflux disease without esophagitis: Secondary | ICD-10-CM | POA: Diagnosis not present

## 2020-02-22 DIAGNOSIS — E785 Hyperlipidemia, unspecified: Secondary | ICD-10-CM | POA: Diagnosis not present

## 2020-02-22 DIAGNOSIS — K5904 Chronic idiopathic constipation: Secondary | ICD-10-CM | POA: Diagnosis not present

## 2020-02-22 DIAGNOSIS — G40409 Other generalized epilepsy and epileptic syndromes, not intractable, without status epilepticus: Secondary | ICD-10-CM | POA: Diagnosis not present

## 2020-02-22 DIAGNOSIS — M19041 Primary osteoarthritis, right hand: Secondary | ICD-10-CM | POA: Diagnosis not present

## 2020-02-22 DIAGNOSIS — M4313 Spondylolisthesis, cervicothoracic region: Secondary | ICD-10-CM | POA: Diagnosis not present

## 2020-02-22 DIAGNOSIS — M8580 Other specified disorders of bone density and structure, unspecified site: Secondary | ICD-10-CM | POA: Diagnosis not present

## 2020-02-22 DIAGNOSIS — S066X0D Traumatic subarachnoid hemorrhage without loss of consciousness, subsequent encounter: Secondary | ICD-10-CM | POA: Diagnosis not present

## 2020-02-22 DIAGNOSIS — G8194 Hemiplegia, unspecified affecting left nondominant side: Secondary | ICD-10-CM | POA: Diagnosis not present

## 2020-02-22 DIAGNOSIS — M519 Unspecified thoracic, thoracolumbar and lumbosacral intervertebral disc disorder: Secondary | ICD-10-CM | POA: Diagnosis not present

## 2020-02-22 DIAGNOSIS — M19042 Primary osteoarthritis, left hand: Secondary | ICD-10-CM | POA: Diagnosis not present

## 2020-02-22 DIAGNOSIS — W19XXXD Unspecified fall, subsequent encounter: Secondary | ICD-10-CM | POA: Diagnosis not present

## 2020-02-22 DIAGNOSIS — G4733 Obstructive sleep apnea (adult) (pediatric): Secondary | ICD-10-CM | POA: Diagnosis not present

## 2020-02-22 DIAGNOSIS — S065X0D Traumatic subdural hemorrhage without loss of consciousness, subsequent encounter: Secondary | ICD-10-CM | POA: Diagnosis not present

## 2020-02-22 DIAGNOSIS — S065X9A Traumatic subdural hemorrhage with loss of consciousness of unspecified duration, initial encounter: Secondary | ICD-10-CM | POA: Diagnosis not present

## 2020-02-22 DIAGNOSIS — M810 Age-related osteoporosis without current pathological fracture: Secondary | ICD-10-CM | POA: Diagnosis not present

## 2020-02-22 DIAGNOSIS — H3530 Unspecified macular degeneration: Secondary | ICD-10-CM | POA: Diagnosis not present

## 2020-02-23 DIAGNOSIS — M810 Age-related osteoporosis without current pathological fracture: Secondary | ICD-10-CM | POA: Diagnosis not present

## 2020-02-23 DIAGNOSIS — S065X9A Traumatic subdural hemorrhage with loss of consciousness of unspecified duration, initial encounter: Secondary | ICD-10-CM | POA: Diagnosis not present

## 2020-02-23 DIAGNOSIS — W19XXXD Unspecified fall, subsequent encounter: Secondary | ICD-10-CM | POA: Diagnosis not present

## 2020-02-23 DIAGNOSIS — K5904 Chronic idiopathic constipation: Secondary | ICD-10-CM | POA: Diagnosis not present

## 2020-02-23 DIAGNOSIS — K219 Gastro-esophageal reflux disease without esophagitis: Secondary | ICD-10-CM | POA: Diagnosis not present

## 2020-03-12 DIAGNOSIS — M810 Age-related osteoporosis without current pathological fracture: Secondary | ICD-10-CM | POA: Diagnosis not present

## 2020-03-12 DIAGNOSIS — M8580 Other specified disorders of bone density and structure, unspecified site: Secondary | ICD-10-CM | POA: Diagnosis not present

## 2020-03-12 DIAGNOSIS — M19042 Primary osteoarthritis, left hand: Secondary | ICD-10-CM | POA: Diagnosis not present

## 2020-03-12 DIAGNOSIS — M4313 Spondylolisthesis, cervicothoracic region: Secondary | ICD-10-CM | POA: Diagnosis not present

## 2020-03-12 DIAGNOSIS — M19041 Primary osteoarthritis, right hand: Secondary | ICD-10-CM | POA: Diagnosis not present

## 2020-03-12 DIAGNOSIS — S065X0D Traumatic subdural hemorrhage without loss of consciousness, subsequent encounter: Secondary | ICD-10-CM | POA: Diagnosis not present

## 2020-03-12 DIAGNOSIS — I10 Essential (primary) hypertension: Secondary | ICD-10-CM | POA: Diagnosis not present

## 2020-03-12 DIAGNOSIS — S066X0D Traumatic subarachnoid hemorrhage without loss of consciousness, subsequent encounter: Secondary | ICD-10-CM | POA: Diagnosis not present

## 2020-03-12 DIAGNOSIS — G8194 Hemiplegia, unspecified affecting left nondominant side: Secondary | ICD-10-CM | POA: Diagnosis not present

## 2020-03-25 DIAGNOSIS — M810 Age-related osteoporosis without current pathological fracture: Secondary | ICD-10-CM | POA: Diagnosis not present

## 2020-03-25 DIAGNOSIS — M19041 Primary osteoarthritis, right hand: Secondary | ICD-10-CM | POA: Diagnosis not present

## 2020-03-25 DIAGNOSIS — S065X0D Traumatic subdural hemorrhage without loss of consciousness, subsequent encounter: Secondary | ICD-10-CM | POA: Diagnosis not present

## 2020-03-25 DIAGNOSIS — M8580 Other specified disorders of bone density and structure, unspecified site: Secondary | ICD-10-CM | POA: Diagnosis not present

## 2020-03-25 DIAGNOSIS — M4313 Spondylolisthesis, cervicothoracic region: Secondary | ICD-10-CM | POA: Diagnosis not present

## 2020-03-25 DIAGNOSIS — I10 Essential (primary) hypertension: Secondary | ICD-10-CM | POA: Diagnosis not present

## 2020-03-25 DIAGNOSIS — M19042 Primary osteoarthritis, left hand: Secondary | ICD-10-CM | POA: Diagnosis not present

## 2020-03-25 DIAGNOSIS — S066X0D Traumatic subarachnoid hemorrhage without loss of consciousness, subsequent encounter: Secondary | ICD-10-CM | POA: Diagnosis not present

## 2020-03-25 DIAGNOSIS — G8194 Hemiplegia, unspecified affecting left nondominant side: Secondary | ICD-10-CM | POA: Diagnosis not present

## 2020-03-26 ENCOUNTER — Other Ambulatory Visit: Payer: Self-pay | Admitting: *Deleted

## 2020-03-26 DIAGNOSIS — G8194 Hemiplegia, unspecified affecting left nondominant side: Secondary | ICD-10-CM | POA: Diagnosis not present

## 2020-03-26 DIAGNOSIS — S066X0D Traumatic subarachnoid hemorrhage without loss of consciousness, subsequent encounter: Secondary | ICD-10-CM | POA: Diagnosis not present

## 2020-03-26 DIAGNOSIS — M8580 Other specified disorders of bone density and structure, unspecified site: Secondary | ICD-10-CM | POA: Diagnosis not present

## 2020-03-26 DIAGNOSIS — S065X0D Traumatic subdural hemorrhage without loss of consciousness, subsequent encounter: Secondary | ICD-10-CM | POA: Diagnosis not present

## 2020-03-26 DIAGNOSIS — M19042 Primary osteoarthritis, left hand: Secondary | ICD-10-CM | POA: Diagnosis not present

## 2020-03-26 DIAGNOSIS — I10 Essential (primary) hypertension: Secondary | ICD-10-CM | POA: Diagnosis not present

## 2020-03-26 DIAGNOSIS — M4313 Spondylolisthesis, cervicothoracic region: Secondary | ICD-10-CM | POA: Diagnosis not present

## 2020-03-26 DIAGNOSIS — M19041 Primary osteoarthritis, right hand: Secondary | ICD-10-CM | POA: Diagnosis not present

## 2020-03-26 DIAGNOSIS — M810 Age-related osteoporosis without current pathological fracture: Secondary | ICD-10-CM | POA: Diagnosis not present

## 2020-03-26 NOTE — Patient Outreach (Signed)
[facility]) Care Management  03/26/2020  [name] [name] December 11, 1935 [id number]      Transition of care referral   Referral date : 03/26/20 Referral source: [employer] discharge notification report Date of Discharge: 03/23/20 Facility: [place]    Referral received. Transition of care calls being completed via EMMI-automated calls. RN CM will outreach patient for any red flags received.   Case Closure Reason: enrolled in another program.   Joylene Draft, RN, BSN  [place] Management Coordinator  [phone]- Mobile [phone]- [place]

## 2020-03-27 DIAGNOSIS — M810 Age-related osteoporosis without current pathological fracture: Secondary | ICD-10-CM | POA: Diagnosis not present

## 2020-03-27 DIAGNOSIS — I6200 Nontraumatic subdural hemorrhage, unspecified: Secondary | ICD-10-CM | POA: Diagnosis not present

## 2020-03-27 DIAGNOSIS — R2681 Unsteadiness on feet: Secondary | ICD-10-CM | POA: Diagnosis not present

## 2020-03-27 DIAGNOSIS — M6281 Muscle weakness (generalized): Secondary | ICD-10-CM | POA: Diagnosis not present

## 2020-03-27 DIAGNOSIS — R569 Unspecified convulsions: Secondary | ICD-10-CM | POA: Diagnosis not present

## 2020-03-28 DIAGNOSIS — M19042 Primary osteoarthritis, left hand: Secondary | ICD-10-CM | POA: Diagnosis not present

## 2020-03-28 DIAGNOSIS — M19041 Primary osteoarthritis, right hand: Secondary | ICD-10-CM | POA: Diagnosis not present

## 2020-03-28 DIAGNOSIS — M8580 Other specified disorders of bone density and structure, unspecified site: Secondary | ICD-10-CM | POA: Diagnosis not present

## 2020-03-28 DIAGNOSIS — M4313 Spondylolisthesis, cervicothoracic region: Secondary | ICD-10-CM | POA: Diagnosis not present

## 2020-03-28 DIAGNOSIS — S065X0D Traumatic subdural hemorrhage without loss of consciousness, subsequent encounter: Secondary | ICD-10-CM | POA: Diagnosis not present

## 2020-03-28 DIAGNOSIS — G8194 Hemiplegia, unspecified affecting left nondominant side: Secondary | ICD-10-CM | POA: Diagnosis not present

## 2020-03-28 DIAGNOSIS — M810 Age-related osteoporosis without current pathological fracture: Secondary | ICD-10-CM | POA: Diagnosis not present

## 2020-03-28 DIAGNOSIS — I10 Essential (primary) hypertension: Secondary | ICD-10-CM | POA: Diagnosis not present

## 2020-03-28 DIAGNOSIS — S066X0D Traumatic subarachnoid hemorrhage without loss of consciousness, subsequent encounter: Secondary | ICD-10-CM | POA: Diagnosis not present

## 2020-03-30 DIAGNOSIS — S065X0D Traumatic subdural hemorrhage without loss of consciousness, subsequent encounter: Secondary | ICD-10-CM | POA: Diagnosis not present

## 2020-03-30 DIAGNOSIS — M4313 Spondylolisthesis, cervicothoracic region: Secondary | ICD-10-CM | POA: Diagnosis not present

## 2020-03-30 DIAGNOSIS — M19042 Primary osteoarthritis, left hand: Secondary | ICD-10-CM | POA: Diagnosis not present

## 2020-03-30 DIAGNOSIS — M19041 Primary osteoarthritis, right hand: Secondary | ICD-10-CM | POA: Diagnosis not present

## 2020-03-30 DIAGNOSIS — G8194 Hemiplegia, unspecified affecting left nondominant side: Secondary | ICD-10-CM | POA: Diagnosis not present

## 2020-03-30 DIAGNOSIS — M810 Age-related osteoporosis without current pathological fracture: Secondary | ICD-10-CM | POA: Diagnosis not present

## 2020-03-30 DIAGNOSIS — M8580 Other specified disorders of bone density and structure, unspecified site: Secondary | ICD-10-CM | POA: Diagnosis not present

## 2020-03-30 DIAGNOSIS — I10 Essential (primary) hypertension: Secondary | ICD-10-CM | POA: Diagnosis not present

## 2020-03-30 DIAGNOSIS — S066X0D Traumatic subarachnoid hemorrhage without loss of consciousness, subsequent encounter: Secondary | ICD-10-CM | POA: Diagnosis not present

## 2020-04-01 DIAGNOSIS — M810 Age-related osteoporosis without current pathological fracture: Secondary | ICD-10-CM | POA: Diagnosis not present

## 2020-04-01 DIAGNOSIS — M4313 Spondylolisthesis, cervicothoracic region: Secondary | ICD-10-CM | POA: Diagnosis not present

## 2020-04-01 DIAGNOSIS — S065X0D Traumatic subdural hemorrhage without loss of consciousness, subsequent encounter: Secondary | ICD-10-CM | POA: Diagnosis not present

## 2020-04-01 DIAGNOSIS — M8580 Other specified disorders of bone density and structure, unspecified site: Secondary | ICD-10-CM | POA: Diagnosis not present

## 2020-04-01 DIAGNOSIS — S066X0D Traumatic subarachnoid hemorrhage without loss of consciousness, subsequent encounter: Secondary | ICD-10-CM | POA: Diagnosis not present

## 2020-04-01 DIAGNOSIS — G8194 Hemiplegia, unspecified affecting left nondominant side: Secondary | ICD-10-CM | POA: Diagnosis not present

## 2020-04-01 DIAGNOSIS — M19041 Primary osteoarthritis, right hand: Secondary | ICD-10-CM | POA: Diagnosis not present

## 2020-04-01 DIAGNOSIS — M19042 Primary osteoarthritis, left hand: Secondary | ICD-10-CM | POA: Diagnosis not present

## 2020-04-01 DIAGNOSIS — I10 Essential (primary) hypertension: Secondary | ICD-10-CM | POA: Diagnosis not present

## 2020-04-02 DIAGNOSIS — S066X0D Traumatic subarachnoid hemorrhage without loss of consciousness, subsequent encounter: Secondary | ICD-10-CM | POA: Diagnosis not present

## 2020-04-02 DIAGNOSIS — M19041 Primary osteoarthritis, right hand: Secondary | ICD-10-CM | POA: Diagnosis not present

## 2020-04-02 DIAGNOSIS — I10 Essential (primary) hypertension: Secondary | ICD-10-CM | POA: Diagnosis not present

## 2020-04-02 DIAGNOSIS — M19042 Primary osteoarthritis, left hand: Secondary | ICD-10-CM | POA: Diagnosis not present

## 2020-04-02 DIAGNOSIS — G8194 Hemiplegia, unspecified affecting left nondominant side: Secondary | ICD-10-CM | POA: Diagnosis not present

## 2020-04-02 DIAGNOSIS — S065X0D Traumatic subdural hemorrhage without loss of consciousness, subsequent encounter: Secondary | ICD-10-CM | POA: Diagnosis not present

## 2020-04-02 DIAGNOSIS — M8580 Other specified disorders of bone density and structure, unspecified site: Secondary | ICD-10-CM | POA: Diagnosis not present

## 2020-04-02 DIAGNOSIS — M810 Age-related osteoporosis without current pathological fracture: Secondary | ICD-10-CM | POA: Diagnosis not present

## 2020-04-02 DIAGNOSIS — M4313 Spondylolisthesis, cervicothoracic region: Secondary | ICD-10-CM | POA: Diagnosis not present

## 2020-04-03 DIAGNOSIS — G8194 Hemiplegia, unspecified affecting left nondominant side: Secondary | ICD-10-CM | POA: Diagnosis not present

## 2020-04-03 DIAGNOSIS — S065X0D Traumatic subdural hemorrhage without loss of consciousness, subsequent encounter: Secondary | ICD-10-CM | POA: Diagnosis not present

## 2020-04-03 DIAGNOSIS — M19041 Primary osteoarthritis, right hand: Secondary | ICD-10-CM | POA: Diagnosis not present

## 2020-04-03 DIAGNOSIS — M19042 Primary osteoarthritis, left hand: Secondary | ICD-10-CM | POA: Diagnosis not present

## 2020-04-03 DIAGNOSIS — M4313 Spondylolisthesis, cervicothoracic region: Secondary | ICD-10-CM | POA: Diagnosis not present

## 2020-04-03 DIAGNOSIS — M810 Age-related osteoporosis without current pathological fracture: Secondary | ICD-10-CM | POA: Diagnosis not present

## 2020-04-03 DIAGNOSIS — I10 Essential (primary) hypertension: Secondary | ICD-10-CM | POA: Diagnosis not present

## 2020-04-03 DIAGNOSIS — S066X0D Traumatic subarachnoid hemorrhage without loss of consciousness, subsequent encounter: Secondary | ICD-10-CM | POA: Diagnosis not present

## 2020-04-03 DIAGNOSIS — M8580 Other specified disorders of bone density and structure, unspecified site: Secondary | ICD-10-CM | POA: Diagnosis not present

## 2020-04-05 DIAGNOSIS — S065X0D Traumatic subdural hemorrhage without loss of consciousness, subsequent encounter: Secondary | ICD-10-CM | POA: Diagnosis not present

## 2020-04-05 DIAGNOSIS — I10 Essential (primary) hypertension: Secondary | ICD-10-CM | POA: Diagnosis not present

## 2020-04-05 DIAGNOSIS — M19041 Primary osteoarthritis, right hand: Secondary | ICD-10-CM | POA: Diagnosis not present

## 2020-04-05 DIAGNOSIS — G8194 Hemiplegia, unspecified affecting left nondominant side: Secondary | ICD-10-CM | POA: Diagnosis not present

## 2020-04-05 DIAGNOSIS — M4313 Spondylolisthesis, cervicothoracic region: Secondary | ICD-10-CM | POA: Diagnosis not present

## 2020-04-05 DIAGNOSIS — M8580 Other specified disorders of bone density and structure, unspecified site: Secondary | ICD-10-CM | POA: Diagnosis not present

## 2020-04-05 DIAGNOSIS — M19042 Primary osteoarthritis, left hand: Secondary | ICD-10-CM | POA: Diagnosis not present

## 2020-04-05 DIAGNOSIS — S066X0D Traumatic subarachnoid hemorrhage without loss of consciousness, subsequent encounter: Secondary | ICD-10-CM | POA: Diagnosis not present

## 2020-04-05 DIAGNOSIS — M810 Age-related osteoporosis without current pathological fracture: Secondary | ICD-10-CM | POA: Diagnosis not present

## 2020-04-08 DIAGNOSIS — M19041 Primary osteoarthritis, right hand: Secondary | ICD-10-CM | POA: Diagnosis not present

## 2020-04-08 DIAGNOSIS — M8580 Other specified disorders of bone density and structure, unspecified site: Secondary | ICD-10-CM | POA: Diagnosis not present

## 2020-04-08 DIAGNOSIS — S065X0D Traumatic subdural hemorrhage without loss of consciousness, subsequent encounter: Secondary | ICD-10-CM | POA: Diagnosis not present

## 2020-04-08 DIAGNOSIS — M19042 Primary osteoarthritis, left hand: Secondary | ICD-10-CM | POA: Diagnosis not present

## 2020-04-08 DIAGNOSIS — I10 Essential (primary) hypertension: Secondary | ICD-10-CM | POA: Diagnosis not present

## 2020-04-08 DIAGNOSIS — S066X0D Traumatic subarachnoid hemorrhage without loss of consciousness, subsequent encounter: Secondary | ICD-10-CM | POA: Diagnosis not present

## 2020-04-08 DIAGNOSIS — G8194 Hemiplegia, unspecified affecting left nondominant side: Secondary | ICD-10-CM | POA: Diagnosis not present

## 2020-04-08 DIAGNOSIS — M4313 Spondylolisthesis, cervicothoracic region: Secondary | ICD-10-CM | POA: Diagnosis not present

## 2020-04-08 DIAGNOSIS — M810 Age-related osteoporosis without current pathological fracture: Secondary | ICD-10-CM | POA: Diagnosis not present

## 2020-04-09 DIAGNOSIS — S065X0D Traumatic subdural hemorrhage without loss of consciousness, subsequent encounter: Secondary | ICD-10-CM | POA: Diagnosis not present

## 2020-04-09 DIAGNOSIS — G8194 Hemiplegia, unspecified affecting left nondominant side: Secondary | ICD-10-CM | POA: Diagnosis not present

## 2020-04-09 DIAGNOSIS — M19041 Primary osteoarthritis, right hand: Secondary | ICD-10-CM | POA: Diagnosis not present

## 2020-04-09 DIAGNOSIS — I10 Essential (primary) hypertension: Secondary | ICD-10-CM | POA: Diagnosis not present

## 2020-04-09 DIAGNOSIS — S066X0D Traumatic subarachnoid hemorrhage without loss of consciousness, subsequent encounter: Secondary | ICD-10-CM | POA: Diagnosis not present

## 2020-04-09 DIAGNOSIS — M4313 Spondylolisthesis, cervicothoracic region: Secondary | ICD-10-CM | POA: Diagnosis not present

## 2020-04-09 DIAGNOSIS — M19042 Primary osteoarthritis, left hand: Secondary | ICD-10-CM | POA: Diagnosis not present

## 2020-04-09 DIAGNOSIS — M8580 Other specified disorders of bone density and structure, unspecified site: Secondary | ICD-10-CM | POA: Diagnosis not present

## 2020-04-09 DIAGNOSIS — M810 Age-related osteoporosis without current pathological fracture: Secondary | ICD-10-CM | POA: Diagnosis not present

## 2020-04-10 DIAGNOSIS — M8580 Other specified disorders of bone density and structure, unspecified site: Secondary | ICD-10-CM | POA: Diagnosis not present

## 2020-04-10 DIAGNOSIS — I10 Essential (primary) hypertension: Secondary | ICD-10-CM | POA: Diagnosis not present

## 2020-04-10 DIAGNOSIS — M19041 Primary osteoarthritis, right hand: Secondary | ICD-10-CM | POA: Diagnosis not present

## 2020-04-10 DIAGNOSIS — M4313 Spondylolisthesis, cervicothoracic region: Secondary | ICD-10-CM | POA: Diagnosis not present

## 2020-04-10 DIAGNOSIS — M19042 Primary osteoarthritis, left hand: Secondary | ICD-10-CM | POA: Diagnosis not present

## 2020-04-10 DIAGNOSIS — S065X0D Traumatic subdural hemorrhage without loss of consciousness, subsequent encounter: Secondary | ICD-10-CM | POA: Diagnosis not present

## 2020-04-10 DIAGNOSIS — M810 Age-related osteoporosis without current pathological fracture: Secondary | ICD-10-CM | POA: Diagnosis not present

## 2020-04-10 DIAGNOSIS — G8194 Hemiplegia, unspecified affecting left nondominant side: Secondary | ICD-10-CM | POA: Diagnosis not present

## 2020-04-10 DIAGNOSIS — S066X0D Traumatic subarachnoid hemorrhage without loss of consciousness, subsequent encounter: Secondary | ICD-10-CM | POA: Diagnosis not present

## 2020-04-11 DIAGNOSIS — G8194 Hemiplegia, unspecified affecting left nondominant side: Secondary | ICD-10-CM | POA: Diagnosis not present

## 2020-04-11 DIAGNOSIS — F0390 Unspecified dementia without behavioral disturbance: Secondary | ICD-10-CM | POA: Diagnosis not present

## 2020-04-11 DIAGNOSIS — S066X0D Traumatic subarachnoid hemorrhage without loss of consciousness, subsequent encounter: Secondary | ICD-10-CM | POA: Diagnosis not present

## 2020-04-11 DIAGNOSIS — I10 Essential (primary) hypertension: Secondary | ICD-10-CM | POA: Diagnosis not present

## 2020-04-11 DIAGNOSIS — R29810 Facial weakness: Secondary | ICD-10-CM | POA: Diagnosis not present

## 2020-04-11 DIAGNOSIS — M19032 Primary osteoarthritis, left wrist: Secondary | ICD-10-CM | POA: Diagnosis not present

## 2020-04-11 DIAGNOSIS — M19031 Primary osteoarthritis, right wrist: Secondary | ICD-10-CM | POA: Diagnosis not present

## 2020-04-11 DIAGNOSIS — S065X0D Traumatic subdural hemorrhage without loss of consciousness, subsequent encounter: Secondary | ICD-10-CM | POA: Diagnosis not present

## 2020-04-11 DIAGNOSIS — K222 Esophageal obstruction: Secondary | ICD-10-CM | POA: Diagnosis not present

## 2020-04-12 DIAGNOSIS — I10 Essential (primary) hypertension: Secondary | ICD-10-CM | POA: Diagnosis not present

## 2020-04-12 DIAGNOSIS — S065X0D Traumatic subdural hemorrhage without loss of consciousness, subsequent encounter: Secondary | ICD-10-CM | POA: Diagnosis not present

## 2020-04-12 DIAGNOSIS — M19031 Primary osteoarthritis, right wrist: Secondary | ICD-10-CM | POA: Diagnosis not present

## 2020-04-12 DIAGNOSIS — F0390 Unspecified dementia without behavioral disturbance: Secondary | ICD-10-CM | POA: Diagnosis not present

## 2020-04-12 DIAGNOSIS — R29810 Facial weakness: Secondary | ICD-10-CM | POA: Diagnosis not present

## 2020-04-12 DIAGNOSIS — M19032 Primary osteoarthritis, left wrist: Secondary | ICD-10-CM | POA: Diagnosis not present

## 2020-04-12 DIAGNOSIS — G8194 Hemiplegia, unspecified affecting left nondominant side: Secondary | ICD-10-CM | POA: Diagnosis not present

## 2020-04-12 DIAGNOSIS — S066X0D Traumatic subarachnoid hemorrhage without loss of consciousness, subsequent encounter: Secondary | ICD-10-CM | POA: Diagnosis not present

## 2020-04-12 DIAGNOSIS — K222 Esophageal obstruction: Secondary | ICD-10-CM | POA: Diagnosis not present

## 2020-04-15 DIAGNOSIS — I10 Essential (primary) hypertension: Secondary | ICD-10-CM | POA: Diagnosis not present

## 2020-04-15 DIAGNOSIS — K222 Esophageal obstruction: Secondary | ICD-10-CM | POA: Diagnosis not present

## 2020-04-15 DIAGNOSIS — M19031 Primary osteoarthritis, right wrist: Secondary | ICD-10-CM | POA: Diagnosis not present

## 2020-04-15 DIAGNOSIS — F0390 Unspecified dementia without behavioral disturbance: Secondary | ICD-10-CM | POA: Diagnosis not present

## 2020-04-15 DIAGNOSIS — S065X0D Traumatic subdural hemorrhage without loss of consciousness, subsequent encounter: Secondary | ICD-10-CM | POA: Diagnosis not present

## 2020-04-15 DIAGNOSIS — M19032 Primary osteoarthritis, left wrist: Secondary | ICD-10-CM | POA: Diagnosis not present

## 2020-04-15 DIAGNOSIS — G8194 Hemiplegia, unspecified affecting left nondominant side: Secondary | ICD-10-CM | POA: Diagnosis not present

## 2020-04-15 DIAGNOSIS — R29810 Facial weakness: Secondary | ICD-10-CM | POA: Diagnosis not present

## 2020-04-15 DIAGNOSIS — S066X0D Traumatic subarachnoid hemorrhage without loss of consciousness, subsequent encounter: Secondary | ICD-10-CM | POA: Diagnosis not present

## 2020-04-16 DIAGNOSIS — S065X0D Traumatic subdural hemorrhage without loss of consciousness, subsequent encounter: Secondary | ICD-10-CM | POA: Diagnosis not present

## 2020-04-16 DIAGNOSIS — G8194 Hemiplegia, unspecified affecting left nondominant side: Secondary | ICD-10-CM | POA: Diagnosis not present

## 2020-04-16 DIAGNOSIS — I10 Essential (primary) hypertension: Secondary | ICD-10-CM | POA: Diagnosis not present

## 2020-04-16 DIAGNOSIS — R29810 Facial weakness: Secondary | ICD-10-CM | POA: Diagnosis not present

## 2020-04-16 DIAGNOSIS — F0390 Unspecified dementia without behavioral disturbance: Secondary | ICD-10-CM | POA: Diagnosis not present

## 2020-04-16 DIAGNOSIS — S066X0D Traumatic subarachnoid hemorrhage without loss of consciousness, subsequent encounter: Secondary | ICD-10-CM | POA: Diagnosis not present

## 2020-04-16 DIAGNOSIS — K222 Esophageal obstruction: Secondary | ICD-10-CM | POA: Diagnosis not present

## 2020-04-16 DIAGNOSIS — M19032 Primary osteoarthritis, left wrist: Secondary | ICD-10-CM | POA: Diagnosis not present

## 2020-04-16 DIAGNOSIS — M19031 Primary osteoarthritis, right wrist: Secondary | ICD-10-CM | POA: Diagnosis not present

## 2020-04-17 ENCOUNTER — Telehealth: Payer: Self-pay

## 2020-04-17 NOTE — Telephone Encounter (Signed)
Volunteer support call for palliative care, message left 

## 2020-04-18 DIAGNOSIS — S066X0D Traumatic subarachnoid hemorrhage without loss of consciousness, subsequent encounter: Secondary | ICD-10-CM | POA: Diagnosis not present

## 2020-04-18 DIAGNOSIS — M19031 Primary osteoarthritis, right wrist: Secondary | ICD-10-CM | POA: Diagnosis not present

## 2020-04-18 DIAGNOSIS — K222 Esophageal obstruction: Secondary | ICD-10-CM | POA: Diagnosis not present

## 2020-04-18 DIAGNOSIS — S065X0D Traumatic subdural hemorrhage without loss of consciousness, subsequent encounter: Secondary | ICD-10-CM | POA: Diagnosis not present

## 2020-04-18 DIAGNOSIS — G8194 Hemiplegia, unspecified affecting left nondominant side: Secondary | ICD-10-CM | POA: Diagnosis not present

## 2020-04-18 DIAGNOSIS — I10 Essential (primary) hypertension: Secondary | ICD-10-CM | POA: Diagnosis not present

## 2020-04-18 DIAGNOSIS — R29810 Facial weakness: Secondary | ICD-10-CM | POA: Diagnosis not present

## 2020-04-18 DIAGNOSIS — M19032 Primary osteoarthritis, left wrist: Secondary | ICD-10-CM | POA: Diagnosis not present

## 2020-04-18 DIAGNOSIS — F0390 Unspecified dementia without behavioral disturbance: Secondary | ICD-10-CM | POA: Diagnosis not present

## 2020-04-19 DIAGNOSIS — K222 Esophageal obstruction: Secondary | ICD-10-CM | POA: Diagnosis not present

## 2020-04-19 DIAGNOSIS — R29810 Facial weakness: Secondary | ICD-10-CM | POA: Diagnosis not present

## 2020-04-19 DIAGNOSIS — F0390 Unspecified dementia without behavioral disturbance: Secondary | ICD-10-CM | POA: Diagnosis not present

## 2020-04-19 DIAGNOSIS — M19032 Primary osteoarthritis, left wrist: Secondary | ICD-10-CM | POA: Diagnosis not present

## 2020-04-19 DIAGNOSIS — G8194 Hemiplegia, unspecified affecting left nondominant side: Secondary | ICD-10-CM | POA: Diagnosis not present

## 2020-04-19 DIAGNOSIS — I10 Essential (primary) hypertension: Secondary | ICD-10-CM | POA: Diagnosis not present

## 2020-04-19 DIAGNOSIS — M19031 Primary osteoarthritis, right wrist: Secondary | ICD-10-CM | POA: Diagnosis not present

## 2020-04-19 DIAGNOSIS — S065X0D Traumatic subdural hemorrhage without loss of consciousness, subsequent encounter: Secondary | ICD-10-CM | POA: Diagnosis not present

## 2020-04-19 DIAGNOSIS — S066X0D Traumatic subarachnoid hemorrhage without loss of consciousness, subsequent encounter: Secondary | ICD-10-CM | POA: Diagnosis not present

## 2020-04-22 DIAGNOSIS — G8194 Hemiplegia, unspecified affecting left nondominant side: Secondary | ICD-10-CM | POA: Diagnosis not present

## 2020-04-22 DIAGNOSIS — S065X0D Traumatic subdural hemorrhage without loss of consciousness, subsequent encounter: Secondary | ICD-10-CM | POA: Diagnosis not present

## 2020-04-22 DIAGNOSIS — M19031 Primary osteoarthritis, right wrist: Secondary | ICD-10-CM | POA: Diagnosis not present

## 2020-04-22 DIAGNOSIS — I10 Essential (primary) hypertension: Secondary | ICD-10-CM | POA: Diagnosis not present

## 2020-04-22 DIAGNOSIS — M19032 Primary osteoarthritis, left wrist: Secondary | ICD-10-CM | POA: Diagnosis not present

## 2020-04-22 DIAGNOSIS — S066X0D Traumatic subarachnoid hemorrhage without loss of consciousness, subsequent encounter: Secondary | ICD-10-CM | POA: Diagnosis not present

## 2020-04-22 DIAGNOSIS — R29810 Facial weakness: Secondary | ICD-10-CM | POA: Diagnosis not present

## 2020-04-22 DIAGNOSIS — F0390 Unspecified dementia without behavioral disturbance: Secondary | ICD-10-CM | POA: Diagnosis not present

## 2020-04-22 DIAGNOSIS — K222 Esophageal obstruction: Secondary | ICD-10-CM | POA: Diagnosis not present

## 2020-04-23 DIAGNOSIS — S065X0D Traumatic subdural hemorrhage without loss of consciousness, subsequent encounter: Secondary | ICD-10-CM | POA: Diagnosis not present

## 2020-04-23 DIAGNOSIS — S066X0D Traumatic subarachnoid hemorrhage without loss of consciousness, subsequent encounter: Secondary | ICD-10-CM | POA: Diagnosis not present

## 2020-04-23 DIAGNOSIS — M19032 Primary osteoarthritis, left wrist: Secondary | ICD-10-CM | POA: Diagnosis not present

## 2020-04-23 DIAGNOSIS — G8194 Hemiplegia, unspecified affecting left nondominant side: Secondary | ICD-10-CM | POA: Diagnosis not present

## 2020-04-23 DIAGNOSIS — K222 Esophageal obstruction: Secondary | ICD-10-CM | POA: Diagnosis not present

## 2020-04-23 DIAGNOSIS — R29810 Facial weakness: Secondary | ICD-10-CM | POA: Diagnosis not present

## 2020-04-23 DIAGNOSIS — I10 Essential (primary) hypertension: Secondary | ICD-10-CM | POA: Diagnosis not present

## 2020-04-23 DIAGNOSIS — F0390 Unspecified dementia without behavioral disturbance: Secondary | ICD-10-CM | POA: Diagnosis not present

## 2020-04-23 DIAGNOSIS — M19031 Primary osteoarthritis, right wrist: Secondary | ICD-10-CM | POA: Diagnosis not present

## 2020-04-24 DIAGNOSIS — H6123 Impacted cerumen, bilateral: Secondary | ICD-10-CM | POA: Diagnosis not present

## 2020-04-25 DIAGNOSIS — M19032 Primary osteoarthritis, left wrist: Secondary | ICD-10-CM | POA: Diagnosis not present

## 2020-04-25 DIAGNOSIS — G8194 Hemiplegia, unspecified affecting left nondominant side: Secondary | ICD-10-CM | POA: Diagnosis not present

## 2020-04-25 DIAGNOSIS — S065X0D Traumatic subdural hemorrhage without loss of consciousness, subsequent encounter: Secondary | ICD-10-CM | POA: Diagnosis not present

## 2020-04-25 DIAGNOSIS — I10 Essential (primary) hypertension: Secondary | ICD-10-CM | POA: Diagnosis not present

## 2020-04-25 DIAGNOSIS — R29810 Facial weakness: Secondary | ICD-10-CM | POA: Diagnosis not present

## 2020-04-25 DIAGNOSIS — K222 Esophageal obstruction: Secondary | ICD-10-CM | POA: Diagnosis not present

## 2020-04-25 DIAGNOSIS — S066X0D Traumatic subarachnoid hemorrhage without loss of consciousness, subsequent encounter: Secondary | ICD-10-CM | POA: Diagnosis not present

## 2020-04-25 DIAGNOSIS — M19031 Primary osteoarthritis, right wrist: Secondary | ICD-10-CM | POA: Diagnosis not present

## 2020-04-25 DIAGNOSIS — F0390 Unspecified dementia without behavioral disturbance: Secondary | ICD-10-CM | POA: Diagnosis not present

## 2020-04-26 DIAGNOSIS — K222 Esophageal obstruction: Secondary | ICD-10-CM | POA: Diagnosis not present

## 2020-04-26 DIAGNOSIS — R29810 Facial weakness: Secondary | ICD-10-CM | POA: Diagnosis not present

## 2020-04-26 DIAGNOSIS — S066X0D Traumatic subarachnoid hemorrhage without loss of consciousness, subsequent encounter: Secondary | ICD-10-CM | POA: Diagnosis not present

## 2020-04-26 DIAGNOSIS — G8194 Hemiplegia, unspecified affecting left nondominant side: Secondary | ICD-10-CM | POA: Diagnosis not present

## 2020-04-26 DIAGNOSIS — M19031 Primary osteoarthritis, right wrist: Secondary | ICD-10-CM | POA: Diagnosis not present

## 2020-04-26 DIAGNOSIS — S065X0D Traumatic subdural hemorrhage without loss of consciousness, subsequent encounter: Secondary | ICD-10-CM | POA: Diagnosis not present

## 2020-04-26 DIAGNOSIS — I10 Essential (primary) hypertension: Secondary | ICD-10-CM | POA: Diagnosis not present

## 2020-04-26 DIAGNOSIS — M19032 Primary osteoarthritis, left wrist: Secondary | ICD-10-CM | POA: Diagnosis not present

## 2020-04-26 DIAGNOSIS — F0390 Unspecified dementia without behavioral disturbance: Secondary | ICD-10-CM | POA: Diagnosis not present

## 2020-05-01 DIAGNOSIS — M19032 Primary osteoarthritis, left wrist: Secondary | ICD-10-CM | POA: Diagnosis not present

## 2020-05-01 DIAGNOSIS — F0390 Unspecified dementia without behavioral disturbance: Secondary | ICD-10-CM | POA: Diagnosis not present

## 2020-05-01 DIAGNOSIS — G8194 Hemiplegia, unspecified affecting left nondominant side: Secondary | ICD-10-CM | POA: Diagnosis not present

## 2020-05-01 DIAGNOSIS — S065X0D Traumatic subdural hemorrhage without loss of consciousness, subsequent encounter: Secondary | ICD-10-CM | POA: Diagnosis not present

## 2020-05-01 DIAGNOSIS — K222 Esophageal obstruction: Secondary | ICD-10-CM | POA: Diagnosis not present

## 2020-05-01 DIAGNOSIS — I10 Essential (primary) hypertension: Secondary | ICD-10-CM | POA: Diagnosis not present

## 2020-05-01 DIAGNOSIS — S066X0D Traumatic subarachnoid hemorrhage without loss of consciousness, subsequent encounter: Secondary | ICD-10-CM | POA: Diagnosis not present

## 2020-05-01 DIAGNOSIS — R29810 Facial weakness: Secondary | ICD-10-CM | POA: Diagnosis not present

## 2020-05-01 DIAGNOSIS — M19031 Primary osteoarthritis, right wrist: Secondary | ICD-10-CM | POA: Diagnosis not present

## 2020-05-03 DIAGNOSIS — I10 Essential (primary) hypertension: Secondary | ICD-10-CM | POA: Diagnosis not present

## 2020-05-03 DIAGNOSIS — G8194 Hemiplegia, unspecified affecting left nondominant side: Secondary | ICD-10-CM | POA: Diagnosis not present

## 2020-05-03 DIAGNOSIS — K222 Esophageal obstruction: Secondary | ICD-10-CM | POA: Diagnosis not present

## 2020-05-03 DIAGNOSIS — F0390 Unspecified dementia without behavioral disturbance: Secondary | ICD-10-CM | POA: Diagnosis not present

## 2020-05-03 DIAGNOSIS — S066X0D Traumatic subarachnoid hemorrhage without loss of consciousness, subsequent encounter: Secondary | ICD-10-CM | POA: Diagnosis not present

## 2020-05-03 DIAGNOSIS — M19032 Primary osteoarthritis, left wrist: Secondary | ICD-10-CM | POA: Diagnosis not present

## 2020-05-03 DIAGNOSIS — M19031 Primary osteoarthritis, right wrist: Secondary | ICD-10-CM | POA: Diagnosis not present

## 2020-05-03 DIAGNOSIS — R29810 Facial weakness: Secondary | ICD-10-CM | POA: Diagnosis not present

## 2020-05-03 DIAGNOSIS — S065X0D Traumatic subdural hemorrhage without loss of consciousness, subsequent encounter: Secondary | ICD-10-CM | POA: Diagnosis not present

## 2020-05-07 DIAGNOSIS — M19031 Primary osteoarthritis, right wrist: Secondary | ICD-10-CM | POA: Diagnosis not present

## 2020-05-07 DIAGNOSIS — K222 Esophageal obstruction: Secondary | ICD-10-CM | POA: Diagnosis not present

## 2020-05-07 DIAGNOSIS — I10 Essential (primary) hypertension: Secondary | ICD-10-CM | POA: Diagnosis not present

## 2020-05-07 DIAGNOSIS — S066X0D Traumatic subarachnoid hemorrhage without loss of consciousness, subsequent encounter: Secondary | ICD-10-CM | POA: Diagnosis not present

## 2020-05-07 DIAGNOSIS — S065X0D Traumatic subdural hemorrhage without loss of consciousness, subsequent encounter: Secondary | ICD-10-CM | POA: Diagnosis not present

## 2020-05-07 DIAGNOSIS — G8194 Hemiplegia, unspecified affecting left nondominant side: Secondary | ICD-10-CM | POA: Diagnosis not present

## 2020-05-07 DIAGNOSIS — F0390 Unspecified dementia without behavioral disturbance: Secondary | ICD-10-CM | POA: Diagnosis not present

## 2020-05-07 DIAGNOSIS — R29810 Facial weakness: Secondary | ICD-10-CM | POA: Diagnosis not present

## 2020-05-07 DIAGNOSIS — M19032 Primary osteoarthritis, left wrist: Secondary | ICD-10-CM | POA: Diagnosis not present

## 2020-05-11 DIAGNOSIS — S066X0D Traumatic subarachnoid hemorrhage without loss of consciousness, subsequent encounter: Secondary | ICD-10-CM | POA: Diagnosis not present

## 2020-05-11 DIAGNOSIS — G8194 Hemiplegia, unspecified affecting left nondominant side: Secondary | ICD-10-CM | POA: Diagnosis not present

## 2020-05-11 DIAGNOSIS — M19032 Primary osteoarthritis, left wrist: Secondary | ICD-10-CM | POA: Diagnosis not present

## 2020-05-11 DIAGNOSIS — R29810 Facial weakness: Secondary | ICD-10-CM | POA: Diagnosis not present

## 2020-05-11 DIAGNOSIS — M19031 Primary osteoarthritis, right wrist: Secondary | ICD-10-CM | POA: Diagnosis not present

## 2020-05-11 DIAGNOSIS — S065X0D Traumatic subdural hemorrhage without loss of consciousness, subsequent encounter: Secondary | ICD-10-CM | POA: Diagnosis not present

## 2020-05-11 DIAGNOSIS — I10 Essential (primary) hypertension: Secondary | ICD-10-CM | POA: Diagnosis not present

## 2020-05-11 DIAGNOSIS — K222 Esophageal obstruction: Secondary | ICD-10-CM | POA: Diagnosis not present

## 2020-05-11 DIAGNOSIS — F0390 Unspecified dementia without behavioral disturbance: Secondary | ICD-10-CM | POA: Diagnosis not present

## 2020-05-13 DIAGNOSIS — F0390 Unspecified dementia without behavioral disturbance: Secondary | ICD-10-CM | POA: Diagnosis not present

## 2020-05-13 DIAGNOSIS — I10 Essential (primary) hypertension: Secondary | ICD-10-CM | POA: Diagnosis not present

## 2020-05-13 DIAGNOSIS — G8194 Hemiplegia, unspecified affecting left nondominant side: Secondary | ICD-10-CM | POA: Diagnosis not present

## 2020-05-13 DIAGNOSIS — M19031 Primary osteoarthritis, right wrist: Secondary | ICD-10-CM | POA: Diagnosis not present

## 2020-05-13 DIAGNOSIS — R29810 Facial weakness: Secondary | ICD-10-CM | POA: Diagnosis not present

## 2020-05-13 DIAGNOSIS — K222 Esophageal obstruction: Secondary | ICD-10-CM | POA: Diagnosis not present

## 2020-05-13 DIAGNOSIS — S065X0D Traumatic subdural hemorrhage without loss of consciousness, subsequent encounter: Secondary | ICD-10-CM | POA: Diagnosis not present

## 2020-05-13 DIAGNOSIS — S066X0D Traumatic subarachnoid hemorrhage without loss of consciousness, subsequent encounter: Secondary | ICD-10-CM | POA: Diagnosis not present

## 2020-05-13 DIAGNOSIS — M19032 Primary osteoarthritis, left wrist: Secondary | ICD-10-CM | POA: Diagnosis not present

## 2020-05-14 DIAGNOSIS — R69 Illness, unspecified: Secondary | ICD-10-CM | POA: Diagnosis not present

## 2020-05-15 DIAGNOSIS — M19031 Primary osteoarthritis, right wrist: Secondary | ICD-10-CM | POA: Diagnosis not present

## 2020-05-15 DIAGNOSIS — S066X0D Traumatic subarachnoid hemorrhage without loss of consciousness, subsequent encounter: Secondary | ICD-10-CM | POA: Diagnosis not present

## 2020-05-15 DIAGNOSIS — G8194 Hemiplegia, unspecified affecting left nondominant side: Secondary | ICD-10-CM | POA: Diagnosis not present

## 2020-05-15 DIAGNOSIS — K222 Esophageal obstruction: Secondary | ICD-10-CM | POA: Diagnosis not present

## 2020-05-15 DIAGNOSIS — F0390 Unspecified dementia without behavioral disturbance: Secondary | ICD-10-CM | POA: Diagnosis not present

## 2020-05-15 DIAGNOSIS — R29810 Facial weakness: Secondary | ICD-10-CM | POA: Diagnosis not present

## 2020-05-15 DIAGNOSIS — M19032 Primary osteoarthritis, left wrist: Secondary | ICD-10-CM | POA: Diagnosis not present

## 2020-05-15 DIAGNOSIS — I10 Essential (primary) hypertension: Secondary | ICD-10-CM | POA: Diagnosis not present

## 2020-05-15 DIAGNOSIS — S065X0D Traumatic subdural hemorrhage without loss of consciousness, subsequent encounter: Secondary | ICD-10-CM | POA: Diagnosis not present

## 2020-05-17 DIAGNOSIS — S066X0D Traumatic subarachnoid hemorrhage without loss of consciousness, subsequent encounter: Secondary | ICD-10-CM | POA: Diagnosis not present

## 2020-05-17 DIAGNOSIS — M19032 Primary osteoarthritis, left wrist: Secondary | ICD-10-CM | POA: Diagnosis not present

## 2020-05-17 DIAGNOSIS — R29810 Facial weakness: Secondary | ICD-10-CM | POA: Diagnosis not present

## 2020-05-17 DIAGNOSIS — I10 Essential (primary) hypertension: Secondary | ICD-10-CM | POA: Diagnosis not present

## 2020-05-17 DIAGNOSIS — K222 Esophageal obstruction: Secondary | ICD-10-CM | POA: Diagnosis not present

## 2020-05-17 DIAGNOSIS — F0390 Unspecified dementia without behavioral disturbance: Secondary | ICD-10-CM | POA: Diagnosis not present

## 2020-05-17 DIAGNOSIS — M19031 Primary osteoarthritis, right wrist: Secondary | ICD-10-CM | POA: Diagnosis not present

## 2020-05-17 DIAGNOSIS — S065X0D Traumatic subdural hemorrhage without loss of consciousness, subsequent encounter: Secondary | ICD-10-CM | POA: Diagnosis not present

## 2020-05-17 DIAGNOSIS — G8194 Hemiplegia, unspecified affecting left nondominant side: Secondary | ICD-10-CM | POA: Diagnosis not present

## 2020-05-20 DIAGNOSIS — F0390 Unspecified dementia without behavioral disturbance: Secondary | ICD-10-CM | POA: Diagnosis not present

## 2020-05-20 DIAGNOSIS — M19032 Primary osteoarthritis, left wrist: Secondary | ICD-10-CM | POA: Diagnosis not present

## 2020-05-20 DIAGNOSIS — R29810 Facial weakness: Secondary | ICD-10-CM | POA: Diagnosis not present

## 2020-05-20 DIAGNOSIS — G8194 Hemiplegia, unspecified affecting left nondominant side: Secondary | ICD-10-CM | POA: Diagnosis not present

## 2020-05-20 DIAGNOSIS — S065X0D Traumatic subdural hemorrhage without loss of consciousness, subsequent encounter: Secondary | ICD-10-CM | POA: Diagnosis not present

## 2020-05-20 DIAGNOSIS — S066X0D Traumatic subarachnoid hemorrhage without loss of consciousness, subsequent encounter: Secondary | ICD-10-CM | POA: Diagnosis not present

## 2020-05-20 DIAGNOSIS — I10 Essential (primary) hypertension: Secondary | ICD-10-CM | POA: Diagnosis not present

## 2020-05-20 DIAGNOSIS — M19031 Primary osteoarthritis, right wrist: Secondary | ICD-10-CM | POA: Diagnosis not present

## 2020-05-20 DIAGNOSIS — K222 Esophageal obstruction: Secondary | ICD-10-CM | POA: Diagnosis not present

## 2020-05-21 DIAGNOSIS — F0390 Unspecified dementia without behavioral disturbance: Secondary | ICD-10-CM | POA: Diagnosis not present

## 2020-05-21 DIAGNOSIS — M19032 Primary osteoarthritis, left wrist: Secondary | ICD-10-CM | POA: Diagnosis not present

## 2020-05-21 DIAGNOSIS — M19031 Primary osteoarthritis, right wrist: Secondary | ICD-10-CM | POA: Diagnosis not present

## 2020-05-21 DIAGNOSIS — G8194 Hemiplegia, unspecified affecting left nondominant side: Secondary | ICD-10-CM | POA: Diagnosis not present

## 2020-05-21 DIAGNOSIS — R29810 Facial weakness: Secondary | ICD-10-CM | POA: Diagnosis not present

## 2020-05-21 DIAGNOSIS — S065X0D Traumatic subdural hemorrhage without loss of consciousness, subsequent encounter: Secondary | ICD-10-CM | POA: Diagnosis not present

## 2020-05-21 DIAGNOSIS — I10 Essential (primary) hypertension: Secondary | ICD-10-CM | POA: Diagnosis not present

## 2020-05-21 DIAGNOSIS — K222 Esophageal obstruction: Secondary | ICD-10-CM | POA: Diagnosis not present

## 2020-05-21 DIAGNOSIS — S066X0D Traumatic subarachnoid hemorrhage without loss of consciousness, subsequent encounter: Secondary | ICD-10-CM | POA: Diagnosis not present

## 2020-05-27 DIAGNOSIS — M19032 Primary osteoarthritis, left wrist: Secondary | ICD-10-CM | POA: Diagnosis not present

## 2020-05-27 DIAGNOSIS — S066X0D Traumatic subarachnoid hemorrhage without loss of consciousness, subsequent encounter: Secondary | ICD-10-CM | POA: Diagnosis not present

## 2020-05-27 DIAGNOSIS — G8194 Hemiplegia, unspecified affecting left nondominant side: Secondary | ICD-10-CM | POA: Diagnosis not present

## 2020-05-27 DIAGNOSIS — M19031 Primary osteoarthritis, right wrist: Secondary | ICD-10-CM | POA: Diagnosis not present

## 2020-05-27 DIAGNOSIS — K222 Esophageal obstruction: Secondary | ICD-10-CM | POA: Diagnosis not present

## 2020-05-27 DIAGNOSIS — I10 Essential (primary) hypertension: Secondary | ICD-10-CM | POA: Diagnosis not present

## 2020-05-27 DIAGNOSIS — S065X0D Traumatic subdural hemorrhage without loss of consciousness, subsequent encounter: Secondary | ICD-10-CM | POA: Diagnosis not present

## 2020-05-27 DIAGNOSIS — R29810 Facial weakness: Secondary | ICD-10-CM | POA: Diagnosis not present

## 2020-05-27 DIAGNOSIS — F0390 Unspecified dementia without behavioral disturbance: Secondary | ICD-10-CM | POA: Diagnosis not present

## 2020-05-28 DIAGNOSIS — R29810 Facial weakness: Secondary | ICD-10-CM | POA: Diagnosis not present

## 2020-05-28 DIAGNOSIS — I10 Essential (primary) hypertension: Secondary | ICD-10-CM | POA: Diagnosis not present

## 2020-05-28 DIAGNOSIS — M19031 Primary osteoarthritis, right wrist: Secondary | ICD-10-CM | POA: Diagnosis not present

## 2020-05-28 DIAGNOSIS — M19032 Primary osteoarthritis, left wrist: Secondary | ICD-10-CM | POA: Diagnosis not present

## 2020-05-28 DIAGNOSIS — F0390 Unspecified dementia without behavioral disturbance: Secondary | ICD-10-CM | POA: Diagnosis not present

## 2020-05-28 DIAGNOSIS — G8194 Hemiplegia, unspecified affecting left nondominant side: Secondary | ICD-10-CM | POA: Diagnosis not present

## 2020-05-28 DIAGNOSIS — K222 Esophageal obstruction: Secondary | ICD-10-CM | POA: Diagnosis not present

## 2020-05-28 DIAGNOSIS — S065X0D Traumatic subdural hemorrhage without loss of consciousness, subsequent encounter: Secondary | ICD-10-CM | POA: Diagnosis not present

## 2020-05-28 DIAGNOSIS — S066X0D Traumatic subarachnoid hemorrhage without loss of consciousness, subsequent encounter: Secondary | ICD-10-CM | POA: Diagnosis not present

## 2020-06-24 ENCOUNTER — Other Ambulatory Visit: Payer: Self-pay | Admitting: Internal Medicine

## 2020-06-24 DIAGNOSIS — Z1231 Encounter for screening mammogram for malignant neoplasm of breast: Secondary | ICD-10-CM

## 2020-07-01 ENCOUNTER — Other Ambulatory Visit: Payer: Self-pay

## 2020-07-01 ENCOUNTER — Ambulatory Visit (INDEPENDENT_AMBULATORY_CARE_PROVIDER_SITE_OTHER): Payer: Medicare HMO | Admitting: Ophthalmology

## 2020-07-01 ENCOUNTER — Encounter (INDEPENDENT_AMBULATORY_CARE_PROVIDER_SITE_OTHER): Payer: Self-pay | Admitting: Ophthalmology

## 2020-07-01 DIAGNOSIS — Z961 Presence of intraocular lens: Secondary | ICD-10-CM | POA: Insufficient documentation

## 2020-07-01 DIAGNOSIS — H35363 Drusen (degenerative) of macula, bilateral: Secondary | ICD-10-CM

## 2020-07-01 DIAGNOSIS — H353134 Nonexudative age-related macular degeneration, bilateral, advanced atrophic with subfoveal involvement: Secondary | ICD-10-CM | POA: Insufficient documentation

## 2020-07-01 NOTE — Assessment & Plan Note (Signed)

## 2020-07-01 NOTE — Progress Notes (Signed)
07/01/2020     CHIEF COMPLAINT Patient presents for Retina Follow Up   HISTORY OF PRESENT ILLNESS: [name] [name] is a 84 y.o. female who presents to the clinic today for:   HPI    Retina Follow Up    Patient presents with  Dry AMD.  In both eyes.  Duration of 1 year.  Since onset it is stable.          Comments    1 year fu - OCT OU Patient denies change in vision and overall has no complaints.        Last edited by Gerda Diss on 07/01/2020 10:28 AM. (History)      Referring physician: Marton Redwood, MD [place],  [place] [address]  HISTORICAL INFORMATION:   Selected notes from the MEDICAL RECORD NUMBER    Lab Results  Component Value Date   HGBA1C 6.0 02/10/2008     CURRENT MEDICATIONS: No current outpatient medications on file. (Ophthalmic Drugs)   No current facility-administered medications for this visit. (Ophthalmic Drugs)   Current Outpatient Medications (Other)  Medication Sig  . Abaloparatide (TYMLOS [place]) Inject into the skin daily.  Marland Kitchen acetaminophen (TYLENOL) 500 MG tablet Take 500 mg by mouth every 6 (six) hours as needed.  . gabapentin (NEURONTIN) 100 MG capsule Take 100 mg by mouth 3 (three) times daily.  Marland Kitchen levETIRAcetam (KEPPRA) 500 MG tablet Take 1 tablet (500 mg total) by mouth 2 (two) times daily.  . pantoprazole (PROTONIX) 40 MG tablet Take 40 mg by mouth daily.  . polyethylene glycol (MIRALAX / GLYCOLAX) packet Take 17 g by mouth 2 (two) times daily as needed.   No current facility-administered medications for this visit. (Other)      REVIEW OF SYSTEMS:    ALLERGIES Allergies  Allergen Reactions  . Isometheptene-Dichloral-Apap Nausea Only    Midrin  . Oxycodone Hcl Nausea And Vomiting and Other (See Comments)    Pt reports tolerating Vicodin    PAST MEDICAL HISTORY Past Medical History:  Diagnosis Date  . Acid reflux disease   . Anemia    history of  . Arthritis    both hands  . Blood transfusion without  reported diagnosis   . Cataract    bilateral cateracts removed  . Common migraine   . Diverticulosis    patient denies  . DJD (degenerative joint disease)   . Esophageal stricture   . Herniated disc   . Hiatal hernia 2009   EGD-Small   . History of colonic polyps   . Hyperlipidemia   . Lacunar infarction ([place])   . Macular degeneration   . Osteoporosis   . Sleep apnea    has c-pap but does not wear   Past Surgical History:  Procedure Laterality Date  . CHOLECYSTECTOMY     4-5 YRS AGO.  Marland Kitchen COLONOSCOPY    . ESOPHAGEAL DILATION    . EYE SURGERY     BILATERAL CATARAT  . INGUINAL HERNIA REPAIR     rt.  Marland Kitchen RECTOCELE REPAIR N/A 02/13/2013   Procedure: POSTERIOR REPAIR (RECTOCELE);  Surgeon: Emily Filbert, MD;  Location: [facility];  Service: Gynecology;  Laterality: N/A;  . ROBOTIC ASSISTED LAPAROSCOPIC LYSIS OF ADHESION  02/13/2016   Procedure: ROBOTIC ASSISTED LAPAROSCOPIC LYSIS OF ADHESION;  Surgeon: Princess Bruins, MD;  Location: [facility];  Service: Gynecology;;  . ROBOTIC ASSISTED LAPAROSCOPIC SACROCOLPOPEXY N/A 02/13/2016   Procedure: ROBOTIC ASSISTED LAPAROSCOPIC SACROCOLPOPEXY With Theola Sequin;  Surgeon: Truddie Hidden  Dellis Filbert, MD;  Location: [facility];  Service: Gynecology;  Laterality: N/A;  . TOTAL ABDOMINAL HYSTERECTOMY    . UPPER GASTROINTESTINAL ENDOSCOPY      FAMILY HISTORY Family History  Problem Relation Age of Onset  . Breast cancer Maternal Aunt   . Colon cancer Maternal Aunt   . Cancer Brother        kidney cancer  . Aneurysm Sister 53       died   . Breast cancer Paternal Aunt   . Esophageal cancer Neg Hx   . Rectal cancer Neg Hx   . Stomach cancer Neg Hx     SOCIAL HISTORY Social History   Tobacco Use  . Smoking status: Former Smoker    Years: 10.00    Types: Cigarettes  . Smokeless tobacco: Never Used  Vaping Use  . Vaping Use: Never used  Substance Use Topics  . Alcohol use: No  . Drug use: No         OPHTHALMIC EXAM:  Base Eye Exam    Visual  Acuity (Snellen - Linear)      Right Left   Dist [place] CF @ 1' CF @ 1'   Dist ph [place] NI NI       Tonometry (Tonopen, 10:33 AM)      Right Left   Pressure 12 13       Pupils      Pupils Dark Light Shape React APD   Right PERRL 4 3 Round Minimal None   Left PERRL 4 3 Round Minimal None       Visual Fields (Counting fingers)      Left Right    Full Full       Extraocular Movement      Right Left    Full Full       Neuro/Psych    Oriented x3: Yes   Mood/Affect: Normal       Dilation    Both eyes: 1.0% Mydriacyl, 2.5% Phenylephrine @ 10:33 AM        Slit Lamp and Fundus Exam    External Exam      Right Left   External Normal Normal       Slit Lamp Exam      Right Left   Lids/Lashes Normal Normal   Conjunctiva/Sclera White and quiet White and quiet   Cornea Clear Clear   Anterior Chamber Deep and quiet Deep and quiet   Iris Round and reactive Round and reactive   Lens Posterior chamber intraocular lens Posterior chamber intraocular lens   Anterior Vitreous Normal Normal       Fundus Exam      Right Left   Posterior Vitreous Posterior vitreous detachment Posterior vitreous detachment   Disc Normal Normal   C/D Ratio 0.45 0.45   Macula Advanced age related macular degeneration, Geographic atrophy Advanced age related macular degeneration, Geographic atrophy   Vessels Normal Normal   Periphery Normal Normal          IMAGING AND PROCEDURES  Imaging and Procedures for 07/01/20  OCT, Retina - OU - Both Eyes       Right Eye Quality was good. Scan locations included subfoveal. Central Foveal Thickness: 215. Progression has been stable. Findings include no SRF, no IRF, outer retinal atrophy, subretinal scarring, intraretinal hyper-reflective material.   Left Eye Quality was good. Scan locations included subfoveal. Central Foveal Thickness: 232. Progression has been stable. Findings include no SRF, no IRF, subretinal scarring, central  retinal atrophy, outer  retinal atrophy, intraretinal hyper-reflective material.                 ASSESSMENT/PLAN:  Advanced nonexudative age-related macular degeneration of both eyes with subfoveal involvement The nature of age--related macular degeneration was discussed with the patient as well as the distinction between dry and wet types. Checking an Amsler Grid daily with advice to return immediately should a distortion develop, was given to the patient. The patient 's smoking status now and in the past was determined and advice based on the AREDS study was provided regarding the consumption of antioxidant supplements. AREDS 2 vitamin formulation was recommended. Consumption of dark leafy vegetables and fresh fruits of various colors was recommended. Treatment modalities for wet macular degeneration particularly the use of intravitreal injections of anti-blood vessel growth factors was discussed with the patient. Avastin, Lucentis, and Eylea are the available options. On occasion, therapy includes the use of photodynamic therapy and thermal laser. Stressed to the patient do not rub eyes.  Patient was advised to check Amsler Grid daily and return immediately if changes are noted. Instructions on using the grid were given to the patient. All patient questions were answered.      ICD-10-CM   1. Advanced nonexudative age-related macular degeneration of both eyes with subfoveal involvement  H35.3134 OCT, Retina - OU - Both Eyes  2. Degenerative retinal drusen of both eyes  H35.363   3. Pseudophakia  Z96.1     1.  Notify us if new visual symptoms develop  2.  3.  Ophthalmic Meds Ordered this visit:  No orders of the defined types were placed in this encounter.      Return in about 1 year (around 07/01/2021) for DILATE OU, OCT.  There are no Patient Instructions on file for this visit.   Explained the diagnoses, plan, and follow up with the patient and they expressed understanding.  Patient expressed  understanding of the importance of proper follow up care.   Clent Demark Alaynah Schutter M.D. Diseases & Surgery of the Retina and Vitreous [facility] [place] 07/01/20     Abbreviations: M myopia (nearsighted); A astigmatism; H hyperopia (farsighted); P presbyopia; Mrx spectacle prescription;  CTL contact lenses; OD right eye; OS left eye; OU both eyes  XT exotropia; ET esotropia; PEK punctate epithelial keratitis; PEE punctate epithelial erosions; DES dry eye syndrome; MGD meibomian gland dysfunction; ATs artificial tears; PFAT's preservative free artificial tears; [place] nuclear sclerotic cataract; PSC posterior subcapsular cataract; ERM epi-retinal membrane; PVD posterior vitreous detachment; RD retinal detachment; DM diabetes mellitus; DR diabetic retinopathy; NPDR non-proliferative diabetic retinopathy; PDR proliferative diabetic retinopathy; CSME clinically significant macular edema; DME diabetic macular edema; dbh dot blot hemorrhages; CWS cotton wool spot; POAG primary open angle glaucoma; C/D cup-to-disc ratio; HVF humphrey visual field; GVF goldmann visual field; OCT optical coherence tomography; IOP intraocular pressure; BRVO Branch retinal vein occlusion; CRVO central retinal vein occlusion; CRAO central retinal artery occlusion; BRAO branch retinal artery occlusion; RT retinal tear; SB scleral buckle; PPV pars plana vitrectomy; VH Vitreous hemorrhage; PRP panretinal laser photocoagulation; IVK intravitreal kenalog; VMT vitreomacular traction; MH Macular hole;  NVD neovascularization of the disc; NVE neovascularization elsewhere; AREDS age related eye disease study; ARMD age related macular degeneration; POAG primary open angle glaucoma; EBMD epithelial/anterior basement membrane dystrophy; ACIOL anterior chamber intraocular lens; IOL intraocular lens; PCIOL posterior chamber intraocular lens; Phaco/IOL phacoemulsification with intraocular lens placement; PRK photorefractive keratectomy; LASIK laser  assisted in situ keratomileusis; HTN hypertension;  DM diabetes mellitus; COPD chronic obstructive pulmonary disease

## 2020-07-10 DIAGNOSIS — G3184 Mild cognitive impairment, so stated: Secondary | ICD-10-CM | POA: Diagnosis not present

## 2020-07-10 DIAGNOSIS — R296 Repeated falls: Secondary | ICD-10-CM | POA: Diagnosis not present

## 2020-07-10 DIAGNOSIS — Z8679 Personal history of other diseases of the circulatory system: Secondary | ICD-10-CM | POA: Diagnosis not present

## 2020-07-10 DIAGNOSIS — K219 Gastro-esophageal reflux disease without esophagitis: Secondary | ICD-10-CM | POA: Diagnosis not present

## 2020-07-10 DIAGNOSIS — M810 Age-related osteoporosis without current pathological fracture: Secondary | ICD-10-CM | POA: Diagnosis not present

## 2020-07-10 DIAGNOSIS — G40909 Epilepsy, unspecified, not intractable, without status epilepticus: Secondary | ICD-10-CM | POA: Diagnosis not present

## 2020-07-30 ENCOUNTER — Ambulatory Visit: Payer: Medicare HMO | Attending: Internal Medicine | Admitting: Physical Therapy

## 2020-07-30 ENCOUNTER — Encounter: Payer: Self-pay | Admitting: Physical Therapy

## 2020-07-30 ENCOUNTER — Other Ambulatory Visit: Payer: Self-pay

## 2020-07-30 DIAGNOSIS — M6281 Muscle weakness (generalized): Secondary | ICD-10-CM | POA: Diagnosis not present

## 2020-07-30 DIAGNOSIS — R296 Repeated falls: Secondary | ICD-10-CM | POA: Diagnosis not present

## 2020-07-30 DIAGNOSIS — R2681 Unsteadiness on feet: Secondary | ICD-10-CM | POA: Diagnosis not present

## 2020-07-30 NOTE — Therapy (Signed)
[place], [place], [address] Phone: [phone]   Fax:  [phone]  Physical Therapy Evaluation  Patient Details  Name: [name] [name] MRN: [id number] Date of Birth: 1935-12-26 Referring Provider (PT): Marton Redwood, MD    Encounter Date: 07/30/2020   PT End of Session - 07/30/20 1220    Visit Number 1    Number of Visits 17    Date for PT Re-Evaluation 09/24/20    Authorization Type [employer]    Progress Note Due on Visit 10    PT Start Time 1102    PT Stop Time 1158    PT Time Calculation (min) 56 min    Activity Tolerance Patient tolerated treatment well    Behavior During Therapy [facility] for tasks assessed/performed           Past Medical History:  Diagnosis Date  . Acid reflux disease   . Anemia    history of  . Arthritis    both hands  . Blood transfusion without reported diagnosis   . Cataract    bilateral cateracts removed  . Common migraine   . Diverticulosis    patient denies  . DJD (degenerative joint disease)   . Esophageal stricture   . Herniated disc   . Hiatal hernia 2009   EGD-Small   . History of colonic polyps   . Hyperlipidemia   . Lacunar infarction ([place])   . Macular degeneration   . Macular degeneration 2016   per husband  . Osteoporosis   . Sleep apnea    has c-pap but does not wear  . Spinal stress fracture 2019   per husband 09/29/2018    Past Surgical History:  Procedure Laterality Date  . CHOLECYSTECTOMY     4-5 YRS AGO.  Marland Kitchen COLONOSCOPY    . ESOPHAGEAL DILATION    . EYE SURGERY     BILATERAL CATARAT  . INGUINAL HERNIA REPAIR     rt.  Marland Kitchen RECTOCELE REPAIR N/A 02/13/2013   Procedure: POSTERIOR REPAIR (RECTOCELE);  Surgeon: Emily Filbert, MD;  Location: [facility];  Service: Gynecology;  Laterality: N/A;  . ROBOTIC ASSISTED LAPAROSCOPIC LYSIS OF ADHESION  02/13/2016   Procedure: ROBOTIC ASSISTED LAPAROSCOPIC LYSIS OF ADHESION;  Surgeon: Princess Bruins, MD;  Location: [facility];  Service: Gynecology;;  . ROBOTIC ASSISTED LAPAROSCOPIC SACROCOLPOPEXY N/A 02/13/2016   Procedure: ROBOTIC ASSISTED LAPAROSCOPIC SACROCOLPOPEXY With Theola Sequin;  Surgeon: Princess Bruins, MD;  Location: [facility];  Service: Gynecology;  Laterality: N/A;  . TOTAL ABDOMINAL HYSTERECTOMY    . UPPER GASTROINTESTINAL ENDOSCOPY      There were no vitals filed for this visit.    Subjective Assessment - 07/30/20 1116    Subjective pt is a 84 y.o F with CC of feeling wobbly that occurs when she stands, which ahs been present since March 2021 she had a subdural hematoma and had a seizure and did some transitional PT. Per pt's husband this has been on going since then but reports no hx of balance/ instability before.    Pertinent History Macular degeneration, hx of cataracts (B extracted), Chronic daily HA, OSA on CPAP (but pt reports she does not use CPAP because it falls off and she doesn't like it), dysphagia, DJD, spondylisthesis of L3 on L4 and L4 on L5, HLD    How long can you sit comfortably? unlimited    How long can you stand comfortably? 10 - 20 min  How long can you walk comfortably? 10 - 20 min    Patient Stated Goals to  improve get the rid of the Rollering walk,    Currently in Pain? Yes    Pain Score 0-No pain   at worst 8-9/10   Pain Location Hip    Pain Orientation Left    Pain Descriptors / Indicators Aching    Pain Type Chronic pain    Pain Onset More than a month ago    Pain Frequency Intermittent    Aggravating Factors  standing/ walking for a while    Pain Relieving Factors laying down/ or sit              [facility] PT Assessment - 07/30/20 0001      Assessment   Medical Diagnosis Repeated falls R29.6    Referring Provider (PT) Marton Redwood, MD     Onset Date/Surgical Date --   since march of 2021   Hand Dominance Right    Next MD Visit unsure    Prior Therapy yes   HHPT and Neuro PT     Precautions   Precautions Fall;Back       Balance Screen   Has the patient fallen in the past 6 months Yes    How many times? 12   6-8 in the last 10 days   Has the patient had a decrease in activity level because of a fear of falling?  Yes    Is the patient reluctant to leave their home because of a fear of falling?  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      Cognition   Overall Cognitive Status Within Functional Limits for tasks assessed      Observation/Other Assessments   Focus on Therapeutic Outcomes (FOTO)  60% limited   predicted 53% limited     Posture/Postural Control   Posture/Postural Control Postural limitations    Postural Limitations Rounded Shoulders;Forward head      ROM / Strength   AROM / PROM / Strength Strength;AROM      Strength   Strength Assessment Site Hip;Knee    Right/Left Hip Right;Left    Right Hip Flexion 3+/5    Right Hip Extension 3/5    Right Hip ABduction 3+/5    Right Hip ADduction 4/5    Left Hip Flexion 3+/5    Left Hip Extension 3/5    Left Hip ABduction 3+/5    Left Hip ADduction 4/5    Right/Left Knee Right;Left    Right Knee Flexion 4-/5    Right Knee Extension 4-/5    Left Knee Flexion 4-/5    Left Knee Extension 4-/5      Palpation   Palpation comment TTP over the L glute med/ max and  L lumbar paraspinals  Transfers   Transfers Sit to Stand;Stand to  [employer] Transfers    Sit to Stand 4: Min assist    Sit to Stand Details Tactile cues for initiation;Tactile cues for weight shifting;Verbal cues for technique;Verbal cues for sequencing    Stand to Sit 4: Min assist    Stand to Sit Details (indicate cue type and reason) Tactile cues for initiation;Tactile cues for weight shifting;Verbal cues for technique;Verbal cues for sequencing      Ambulation/Gait   Ambulation/Gait Yes    Ambulation/Gait Assistance 4: Min guard    Assistive device Rolling walker    Gait Pattern Step-through pattern;Decreased stride length;Trunk flexed    Gait Comments pt required frequent cues on proper RW use and required CGA for safety.       Balance   Balance Assessed Yes      Standardized Balance Assessment   Standardized Balance Assessment Berg Balance Test;Five Times Sit to Stand;Timed Up and Go Test    Five times sit to stand comments  attempted but was only able to stand 2 times using hands at 19 seconds      Berg Balance Test   Sit to Stand Able to stand using hands after several tries    Standing Unsupported Able to stand 2 minutes with supervision    Sitting with Back Unsupported but Feet Supported on Floor or Stool Able to sit safely and securely 2 minutes    Stand to Sit Uses backs of legs against chair to control descent    Transfers Able to transfer with verbal cueing and /or supervision    Standing Unsupported with Eyes Closed Able to stand 10 seconds with supervision    Standing Unsupported with Feet Together Able to place feet together independently and stand for 1 minute with supervision    From Standing, Reach Forward with Outstretched Arm Reaches forward but needs supervision    From Standing Position, Pick up Object from Floor Unable to pick up and needs supervision    From Standing Position, Turn to Look Behind Over each Shoulder Needs supervision when turning    Turn 360 Degrees Able to turn 360 degrees safely but slowly     Standing Unsupported, Alternately Place Feet on Step/Stool Able to complete 4 steps without aid or supervision    Standing Unsupported, One Foot in [employer] balance while stepping or standing    Standing on One Leg Tries to lift leg/unable to hold 3 seconds but remains standing independently    Total Score 27                      Objective measurements completed on examination: See above findings.               PT Education - 07/30/20 1217    Education Details Evaluation findings, POC, goals, HEP with proper form/ rationale. Berg assessment and benefits ofusing RW or potentially Wheelchair due to limited compliance with RW and increased falling hx in the last 2 weeks.    Person(s) Educated Patient;Spouse    Methods Explanation;Verbal cues;Handout    Comprehension Verbalized understanding;Verbal cues required            PT Short Term Goals - 07/30/20 1235      PT SHORT TERM GOAL #1   Title pt to be IND with initial HEp    Time 4    Period Weeks    Status New    Target Date 08/27/20  PT SHORT TERM GOAL #2   Title per pt and husband to reduce falling to </=4 in 1 week time frame for therapuetic progression    Time 4    Period Weeks    Status New    Target Date 08/27/20      PT SHORT TERM GOAL #3   Title improve Berg balance to >/ 32/56 to promote safety/ stability    Time 4    Period Weeks    Status New    Target Date 08/27/20             PT Long Term Goals - 07/30/20 1237      PT LONG TERM GOAL #1   Title improve gross LE strength to >/=4+/5 to maximize hip/ knee stability with standing/ walking    Time 8    Period Weeks    Status New    Target Date 09/24/20      PT LONG TERM GOAL #2   Title improve berg balance to >/= 45/56 to demo improvement in balance/ stabililty    Time 8    Period Weeks    Status New    Target Date 09/24/20      PT LONG TERM GOAL #3   Title pt to be able to stand with LRAD for >/=30 min for in hom and  community amb    Time 8    Period Weeks    Status New    Target Date 09/24/20      PT LONG TERM GOAL #4   Title increase FOTO score to </=53% limited to demo improvement in function    Time 8    Status New    Target Date 09/24/20      PT LONG TERM GOAL #5   Title pt will be IND in all HEP to maintain and progress current LOF IND    Time 8    Status New    Target Date 09/24/20                  Plan - 07/30/20 1224    Clinical Impression Statement pt presents to OPPT with CC feeling wobbly and hx of falling that has increased since her subdural hematoma in March 2021. pt reports she has over 12 falls in the last 6 months with around 6 in the last 10 days. MMT revealed weakness in bil LE. She requires CGA to [facility] for safety with sit to stand transfers. She scored 27/56 on Berg balance placing her in a significant fall risk category and with combination with increased falling may suggest benefit for W/C in combination with RW for safety. She would benefit from physical therapy to decrease falling frequency, increase bil LE strength, promote balacne stability and maximize safety and overall function by addressing the deficits listed.    Personal Factors and Comorbidities Comorbidity 3+;Age;Past/Current Experience    Comorbidities hx of subdural hematoma, osteopenia/ porosis,anemia, hx of falls    Examination-Activity Limitations Lift;Locomotion Level;Reach Overhead;Stand;Squat;Stairs    Stability/Clinical Decision Making Unstable/Unpredictable    Clinical Presentation due to: Macular degeneration, lacunar infarct per chart, osteopenia, hx of cataracts (B extracted), Chronic daily HA, OSA on CPAP (but pt reports she does not use CPAP because it falls off and she doesn't like it), dysphagia, DJD, spondylisthesis of L3 on L4 and L4 on L5, HLD    Rehab Potential Good    PT Frequency 2x / week    PT Duration 8 weeks  PT Treatment/Interventions ADLs/Self Care Home Management;Functional  mobility training;Gait training;Stair training;Patient/family education;Orthotic Fit/Training;DME Instruction;Neuromuscular re-education;Balance training;Therapeutic exercise;Therapeutic activities;Manual techniques;Vestibular;Passive range of motion;Taping;Cryotherapy;Moist Heat    PT Next Visit Plan review/ update HEP PRN, reivew FOTO and provide handout, assess TUG and adjust LTG PRN,  gross hip strengthening, seated balance / standing balance, transfers safely. gait training with RW with proper form.    PT [place] set, seated clamshell, LAQ, seated marching, bridge, seated heel raise    Consulted and Agree with Plan of Care Patient;Family member/caregiver    Family Member Consulted Jim: husband           Patient will benefit from skilled therapeutic intervention in order to improve the following deficits and impairments:  Abnormal gait, Decreased balance, Decreased mobility, Decreased endurance, Decreased knowledge of use of DME, Decreased range of motion, Decreased strength, Increased muscle spasms, Improper body mechanics, Postural dysfunction, Pain  Visit Diagnosis: Unsteadiness on feet  Muscle weakness (generalized)  Repeated falls     Problem List Patient Active Problem List   Diagnosis Date Noted  . Advanced nonexudative age-related macular degeneration of both eyes with subfoveal involvement 07/01/2020  . Degenerative retinal drusen of both eyes 07/01/2020  . Pseudophakia 07/01/2020  . Postoperative state 02/13/2016  . Chest pain with moderate risk of acute coronary syndrome 10/08/2014  . Sepsis syndrome 06/26/2013  . Acute pyelonephritis 06/26/2013  . Migraine 03/28/2013  . Rectocele 02/13/2013  . Sleep apnea 02/16/2012  . Chronic daily headache 01/12/2012  . OSTEOPENIA 10/17/2008  . ESOPHAGEAL STRICTURE 05/02/2008  . DIVERTICULOSIS-COLON 05/02/2008  . DIVERTICULITIS-COLON 05/02/2008  . ABDOMINAL PAIN-EPIGASTRIC 05/02/2008  . GASTRITIS,  ACUTE 03/27/2008  . LEG CRAMPS, NOCTURNAL 02/10/2008  . Headache(784.0) 02/10/2008  . OTHER DYSPHAGIA 02/10/2008  . HYPERLIPIDEMIA 02/06/2008  . LACUNAR INFARCTION 02/06/2008  . COLONIC POLYPS 10/13/2007  . Migraine without aura 09/27/2007  . Esophageal reflux 09/27/2007  . HERNIATED DISC 09/27/2007    Starr Lake PT, DPT, LAT, ATC  07/30/20  12:44 PM      [facility] [address] [place], [place], [address] Phone: [phone]   Fax:  [phone]  Name: [name] [name] MRN: [id number] Date of Birth: 01-12-1936

## 2020-08-06 ENCOUNTER — Ambulatory Visit
Admission: RE | Admit: 2020-08-06 | Discharge: 2020-08-06 | Disposition: A | Discharge status: Home / Self Care | Payer: Medicare HMO | Source: Ambulatory Visit | Attending: Internal Medicine | Admitting: Internal Medicine

## 2020-08-06 ENCOUNTER — Other Ambulatory Visit: Payer: Self-pay

## 2020-08-06 DIAGNOSIS — Z1231 Encounter for screening mammogram for malignant neoplasm of breast: Secondary | ICD-10-CM

## 2020-08-07 ENCOUNTER — Ambulatory Visit: Payer: Medicare HMO | Attending: Internal Medicine | Admitting: Physical Therapy

## 2020-08-07 ENCOUNTER — Encounter: Payer: Self-pay | Admitting: Physical Therapy

## 2020-08-07 ENCOUNTER — Other Ambulatory Visit: Payer: Self-pay

## 2020-08-07 DIAGNOSIS — R296 Repeated falls: Secondary | ICD-10-CM | POA: Insufficient documentation

## 2020-08-07 DIAGNOSIS — M6281 Muscle weakness (generalized): Secondary | ICD-10-CM | POA: Diagnosis not present

## 2020-08-07 DIAGNOSIS — R2681 Unsteadiness on feet: Secondary | ICD-10-CM | POA: Diagnosis not present

## 2020-08-07 NOTE — Therapy (Signed)
[place], [place], [address] Phone: [phone]   Fax:  [phone]  Physical Therapy Treatment  Patient Details  Name: [name] [name] MRN: [id number] Date of Birth: Nov 25, 1936 Referring Provider (PT): Marton Redwood, MD    Encounter Date: 08/07/2020   PT End of Session - 08/07/20 1330    Visit Number 2    Number of Visits 17    Date for PT Re-Evaluation 09/24/20    Authorization Type [employer] Medicare: kx mod at 15th visit    Authorization Time Period 07/30/2020 - 09/24/2020    Authorization - Visit Number 1    Authorization - Number of Visits 16    Progress Note Due on Visit 10    PT Start Time 1331    PT Stop Time 1414    PT Time Calculation (min) 43 min    Activity Tolerance Patient tolerated treatment well    Behavior During Therapy [facility] for tasks assessed/performed           Past Medical History:  Diagnosis Date  . Acid reflux disease   . Anemia    history of  . Arthritis    both hands  . Blood transfusion without reported diagnosis   . Cataract    bilateral cateracts removed  . Common migraine   . Diverticulosis    patient denies  . DJD (degenerative joint disease)   . Esophageal stricture   . Herniated disc   . Hiatal hernia 2009   EGD-Small   . History of colonic polyps   . Hyperlipidemia   . Lacunar infarction ([place])   . Macular degeneration   . Macular degeneration 2016   per husband  . Osteoporosis   . Sleep apnea    has c-pap but does not wear  . Spinal stress fracture 2019   per husband 09/29/2018    Past Surgical History:  Procedure Laterality Date  . CHOLECYSTECTOMY     4-5 YRS AGO.  Marland Kitchen COLONOSCOPY    . ESOPHAGEAL DILATION    . EYE SURGERY     BILATERAL CATARAT  . INGUINAL HERNIA REPAIR     rt.  Marland Kitchen RECTOCELE REPAIR N/A 02/13/2013   Procedure: POSTERIOR REPAIR (RECTOCELE);  Surgeon: Emily Filbert, MD;  Location: [facility];  Service: Gynecology;  Laterality: N/A;  .  ROBOTIC ASSISTED LAPAROSCOPIC LYSIS OF ADHESION  02/13/2016   Procedure: ROBOTIC ASSISTED LAPAROSCOPIC LYSIS OF ADHESION;  Surgeon: Princess Bruins, MD;  Location: [facility];  Service: Gynecology;;  . ROBOTIC ASSISTED LAPAROSCOPIC SACROCOLPOPEXY N/A 02/13/2016   Procedure: ROBOTIC ASSISTED LAPAROSCOPIC SACROCOLPOPEXY With Theola Sequin;  Surgeon: Princess Bruins, MD;  Location: [facility];  Service: Gynecology;  Laterality: N/A;  . TOTAL ABDOMINAL HYSTERECTOMY    . UPPER GASTROINTESTINAL ENDOSCOPY      There were no vitals filed for this visit.   Subjective Assessment - 08/07/20 1331    Subjective "I've been very faithful doing my exercises"    Currently in Pain? No/denies    Pain Score 0-No pain              [facility] PT Assessment - 08/07/20 0001      Assessment   Medical Diagnosis Repeated falls R29.6    Referring Provider (PT) Marton Redwood, MD       Timed Up and Go Test   Normal TUG (seconds) 28    TUG Comments required CGA for safety during standing with use of RW  [facility] Adult PT Treatment/Exercise - 08/07/20 0001      Exercises   Exercises Knee/Hip      Knee/Hip Exercises: Seated   Long Arc Quad Strengthening;15 reps;Both;3 sets   focus on eccentric control   Long Arc Quad Weight 2 lbs.    Marching Both;2 sets;10 reps   2#   Sit to [employer] 2 sets;10 reps   with2", CGA and verbal cues on proper form / rocking forward     Knee/Hip Exercises: Supine   Bridges Strengthening;2 sets;10 reps    Straight Leg Raises 1 set;Both;Strengthening;10 reps    Other Supine Knee/Hip Exercises clamshell 2 x 15 with green theraband                  PT Education - 08/07/20 1408    Education Details Reviewed HEP and FOTO assessment    Person(s) Educated Patient    Methods Explanation;Verbal cues    Comprehension Verbalized understanding;Verbal cues required            PT Short Term Goals - 07/30/20 1235      PT SHORT TERM GOAL #1   Title  pt to be IND with initial HEp    Time 4    Period Weeks    Status New    Target Date 08/27/20      PT SHORT TERM GOAL #2   Title per pt and husband to reduce falling to </=4 in 1 week time frame for therapuetic progression    Time 4    Period Weeks    Status New    Target Date 08/27/20      PT SHORT TERM GOAL #3   Title improve Berg balance to >/ 32/56 to promote safety/ stability    Time 4    Period Weeks    Status New    Target Date 08/27/20             PT Long Term Goals - 08/07/20 1415      PT LONG TERM GOAL #6   Title improve Tug time to </= 18 seconds with RW to maximize function and safety    Baseline -    Status New                 Plan - 08/07/20 1405    Clinical Impression Statement pt reports consistency with her HEP. she scored 28 seconds on her TUG and requring CGA for safety. practiced sit to stand transitions which she required frequence verbal and tactile cues for rocking and avoiding extending to quickly which casues her to lose her balance and require min A for balance. She did very well with exercsie today.    PT Treatment/Interventions ADLs/Self Care Home Management;Functional mobility training;Gait training;Stair training;Patient/family education;Orthotic Fit/Training;DME Instruction;Neuromuscular re-education;Balance training;Therapeutic exercise;Therapeutic activities;Manual techniques;Vestibular;Passive range of motion;Taping;Cryotherapy;Moist Heat    PT Next Visit Plan review/ update HEP PRN,gross hip strengthening, seated balance / standing balance, transfers safely. gait training with RW with proper form.           Patient will benefit from skilled therapeutic intervention in order to improve the following deficits and impairments:  Abnormal gait, Decreased balance, Decreased mobility, Decreased endurance, Decreased knowledge of use of DME, Decreased range of motion, Decreased strength, Increased muscle spasms, Improper body mechanics,  Postural dysfunction, Pain  Visit Diagnosis: Unsteadiness on feet  Muscle weakness (generalized)  Repeated falls     Problem List Patient Active Problem List   Diagnosis Date Noted  .  Advanced nonexudative age-related macular degeneration of both eyes with subfoveal involvement 07/01/2020  . Degenerative retinal drusen of both eyes 07/01/2020  . Pseudophakia 07/01/2020  . Postoperative state 02/13/2016  . Chest pain with moderate risk of acute coronary syndrome 10/08/2014  . Sepsis syndrome 06/26/2013  . Acute pyelonephritis 06/26/2013  . Migraine 03/28/2013  . Rectocele 02/13/2013  . Sleep apnea 02/16/2012  . Chronic daily headache 01/12/2012  . OSTEOPENIA 10/17/2008  . ESOPHAGEAL STRICTURE 05/02/2008  . DIVERTICULOSIS-COLON 05/02/2008  . DIVERTICULITIS-COLON 05/02/2008  . ABDOMINAL PAIN-EPIGASTRIC 05/02/2008  . GASTRITIS, ACUTE 03/27/2008  . LEG CRAMPS, NOCTURNAL 02/10/2008  . Headache(784.0) 02/10/2008  . OTHER DYSPHAGIA 02/10/2008  . HYPERLIPIDEMIA 02/06/2008  . LACUNAR INFARCTION 02/06/2008  . COLONIC POLYPS 10/13/2007  . Migraine without aura 09/27/2007  . Esophageal reflux 09/27/2007  . HERNIATED DISC 09/27/2007   Starr Lake PT, DPT, LAT, ATC  08/07/20  2:17 PM      [facility] [address] [place], [place], [address] Phone: [phone]   Fax:  [phone]  Name: [name] [name] MRN: [id number] Date of Birth: September 29, 1936

## 2020-08-08 DIAGNOSIS — S22009S Unspecified fracture of unspecified thoracic vertebra, sequela: Secondary | ICD-10-CM | POA: Diagnosis not present

## 2020-08-08 DIAGNOSIS — K219 Gastro-esophageal reflux disease without esophagitis: Secondary | ICD-10-CM | POA: Diagnosis not present

## 2020-08-08 DIAGNOSIS — M810 Age-related osteoporosis without current pathological fracture: Secondary | ICD-10-CM | POA: Diagnosis not present

## 2020-08-13 ENCOUNTER — Other Ambulatory Visit: Payer: Self-pay

## 2020-08-13 ENCOUNTER — Ambulatory Visit: Payer: Medicare HMO | Admitting: Physical Therapy

## 2020-08-13 ENCOUNTER — Encounter: Payer: Self-pay | Admitting: Physical Therapy

## 2020-08-13 DIAGNOSIS — R296 Repeated falls: Secondary | ICD-10-CM

## 2020-08-13 DIAGNOSIS — R2681 Unsteadiness on feet: Secondary | ICD-10-CM | POA: Diagnosis not present

## 2020-08-13 DIAGNOSIS — M6281 Muscle weakness (generalized): Secondary | ICD-10-CM | POA: Diagnosis not present

## 2020-08-13 NOTE — Therapy (Signed)
[place], [place], [address] Phone: [phone]   Fax:  [phone]  Physical Therapy Treatment  Patient Details  Name: [name] [name] MRN: [id number] Date of Birth: 1936/06/13 Referring Provider (PT): Marton Redwood, MD    Encounter Date: 08/13/2020   PT End of Session - 08/13/20 1151    Visit Number 3    Number of Visits 17    Date for PT Re-Evaluation 09/24/20    Authorization Type [employer] Medicare: kx mod at 15th visit    Authorization Time Period 07/30/2020 - 09/24/2020    Authorization - Visit Number 2    Authorization - Number of Visits 16    Progress Note Due on Visit 10    PT Start Time 1146    PT Stop Time 1230    PT Time Calculation (min) 44 min    Equipment Utilized During Treatment Gait belt    Activity Tolerance Patient tolerated treatment well    Behavior During Therapy WFL for tasks assessed/performed           Past Medical History:  Diagnosis Date  . Acid reflux disease   . Anemia    history of  . Arthritis    both hands  . Blood transfusion without reported diagnosis   . Cataract    bilateral cateracts removed  . Common migraine   . Diverticulosis    patient denies  . DJD (degenerative joint disease)   . Esophageal stricture   . Herniated disc   . Hiatal hernia 2009   EGD-Small   . History of colonic polyps   . Hyperlipidemia   . Lacunar infarction ([place])   . Macular degeneration   . Macular degeneration 2016   per husband  . Osteoporosis   . Sleep apnea    has c-pap but does not wear  . Spinal stress fracture 2019   per husband 09/29/2018    Past Surgical History:  Procedure Laterality Date  . CHOLECYSTECTOMY     4-5 YRS AGO.  Marland Kitchen COLONOSCOPY    . ESOPHAGEAL DILATION    . EYE SURGERY     BILATERAL CATARAT  . INGUINAL HERNIA REPAIR     rt.  Marland Kitchen RECTOCELE REPAIR N/A 02/13/2013   Procedure: POSTERIOR REPAIR (RECTOCELE);  Surgeon: Emily Filbert, MD;  Location: [facility];  Service: Gynecology;  Laterality: N/A;  . ROBOTIC ASSISTED LAPAROSCOPIC LYSIS OF ADHESION  02/13/2016   Procedure: ROBOTIC ASSISTED LAPAROSCOPIC LYSIS OF ADHESION;  Surgeon: Princess Bruins, MD;  Location: [facility];  Service: Gynecology;;  . ROBOTIC ASSISTED LAPAROSCOPIC SACROCOLPOPEXY N/A 02/13/2016   Procedure: ROBOTIC ASSISTED LAPAROSCOPIC SACROCOLPOPEXY With Theola Sequin;  Surgeon: Princess Bruins, MD;  Location: [facility];  Service: Gynecology;  Laterality: N/A;  . TOTAL ABDOMINAL HYSTERECTOMY    . UPPER GASTROINTESTINAL ENDOSCOPY      There were no vitals filed for this visit.   Subjective Assessment - 08/13/20 1151    Subjective "I tried doing all the exercises we did last session but I couldn't remember them all"    Pertinent History Macular degeneration, hx of cataracts (B extracted), Chronic daily HA, OSA on CPAP (but pt reports she does not use CPAP because it falls off and she doesn't like it), dysphagia, DJD, spondylisthesis of L3 on L4 and L4 on L5, HLD    Patient Stated Goals to  improve get the rid of the Rollering walk,    Currently in Pain? No/denies  Pain Score 0-No pain    Pain Onset More than a month ago    Aggravating Factors  standing/ walking              [facility] PT Assessment - 08/13/20 0001      Assessment   Medical Diagnosis Repeated falls R29.6    Referring Provider (PT) Marton Redwood, MD                          [facility] Adult PT Treatment/Exercise - 08/13/20 0001      Lumbar Exercises: Seated   Other Seated Lumbar Exercises seated rocking forward 2 x 10    to promote biomechanical efficency with standing     Knee/Hip Exercises: Aerobic   Nustep L5 x 5 min LE only      Knee/Hip Exercises: Seated   Long Arc Quad Strengthening;2 sets;15 reps;Both    Long Arc Quad Weight 4 lbs.    Marching 2 sets;15 reps;Both   seated marching on airex pad   Sit to Sand 2 sets;10 reps   from elevated table sitting on airex pad, cues for rocking        Knee/Hip Exercises: Supine   Bridges Strengthening;Both;1 set;10 reps    Straight Leg Raises 2 sets   12 reps     Knee/Hip Exercises: Sidelying   Clams 2 x 12 bil with red theraband                  PT Education - 08/13/20 1238    Education Details Reviewed and updated HEP today. reviewed with pt and spouse proper use of RW keeping hands by her side, and when using the gait belt its okay to be ready to grab the gait belt but is better for balance not to hold onto it.    Person(s) Educated Patient;Spouse    Methods Explanation;Verbal cues;Handout    Comprehension Verbalized understanding;Verbal cues required            PT Short Term Goals - 07/30/20 1235      PT SHORT TERM GOAL #1   Title pt to be IND with initial HEp    Time 4    Period Weeks    Status New    Target Date 08/27/20      PT SHORT TERM GOAL #2   Title per pt and husband to reduce falling to </=4 in 1 week time frame for therapuetic progression    Time 4    Period Weeks    Status New    Target Date 08/27/20      PT SHORT TERM GOAL #3   Title improve Berg balance to >/ 32/56 to promote safety/ stability    Time 4    Period Weeks    Status New    Target Date 08/27/20             PT Long Term Goals - 08/07/20 1415      PT LONG TERM GOAL #6   Title improve Tug time to </= 18 seconds with RW to maximize function and safety    Baseline -    Status New                 Plan - 08/13/20 1241    Clinical Impression Statement pt reports no falls since starting PT and has been consistent with her HEP. She continues to utilize the back of her knee pressed against the chair with  standing and when scooting forward will extend the knees and trunk too quickly and lose balance backward. time spent to practice rocking forward and standing which frequent cues were needed, but was able to stand multiple times with no posterior weight shift/ loss of balance. continued working on hp/ knee strengthening,  which she did well.    PT Treatment/Interventions ADLs/Self Care Home Management;Functional mobility training;Gait training;Stair training;Patient/family education;Orthotic Fit/Training;DME Instruction;Neuromuscular re-education;Balance training;Therapeutic exercise;Therapeutic activities;Manual techniques;Vestibular;Passive range of motion;Taping;Cryotherapy;Moist Heat    PT Next Visit Plan review/ update HEP PRN,gross hip strengthening, seated balance / standing balance, continue practicing sit to stand biomechanics,  gait training with RW with proper form.    PT [place] set, seated clamshell, LAQ, seated marching, bridge, seated heel raise, sidelying clamshell, SLR and seated trunk rocking    Consulted and Agree with Plan of Care Patient           Patient will benefit from skilled therapeutic intervention in order to improve the following deficits and impairments:  Abnormal gait, Decreased balance, Decreased mobility, Decreased endurance, Decreased knowledge of use of DME, Decreased range of motion, Decreased strength, Increased muscle spasms, Improper body mechanics, Postural dysfunction, Pain  Visit Diagnosis: Unsteadiness on feet  Muscle weakness (generalized)  Repeated falls     Problem List Patient Active Problem List   Diagnosis Date Noted  . Advanced nonexudative age-related macular degeneration of both eyes with subfoveal involvement 07/01/2020  . Degenerative retinal drusen of both eyes 07/01/2020  . Pseudophakia 07/01/2020  . Postoperative state 02/13/2016  . Chest pain with moderate risk of acute coronary syndrome 10/08/2014  . Sepsis syndrome 06/26/2013  . Acute pyelonephritis 06/26/2013  . Migraine 03/28/2013  . Rectocele 02/13/2013  . Sleep apnea 02/16/2012  . Chronic daily headache 01/12/2012  . OSTEOPENIA 10/17/2008  . ESOPHAGEAL STRICTURE 05/02/2008  . DIVERTICULOSIS-COLON 05/02/2008  . DIVERTICULITIS-COLON 05/02/2008  .  ABDOMINAL PAIN-EPIGASTRIC 05/02/2008  . GASTRITIS, ACUTE 03/27/2008  . LEG CRAMPS, NOCTURNAL 02/10/2008  . Headache(784.0) 02/10/2008  . OTHER DYSPHAGIA 02/10/2008  . HYPERLIPIDEMIA 02/06/2008  . LACUNAR INFARCTION 02/06/2008  . COLONIC POLYPS 10/13/2007  . Migraine without aura 09/27/2007  . Esophageal reflux 09/27/2007  . HERNIATED DISC 09/27/2007   [name] [name] PT, DPT, LAT, ATC  08/13/20  12:45 PM      [facility] [address] [place], [place], [address] Phone: [phone]   Fax:  [phone]  Name: [name] [name] MRN: [id number] Date of Birth: 12-Aug-1936

## 2020-08-20 ENCOUNTER — Other Ambulatory Visit: Payer: Self-pay

## 2020-08-20 ENCOUNTER — Ambulatory Visit: Payer: Medicare HMO | Admitting: Physical Therapy

## 2020-08-20 ENCOUNTER — Encounter: Payer: Self-pay | Admitting: Physical Therapy

## 2020-08-20 DIAGNOSIS — R2681 Unsteadiness on feet: Secondary | ICD-10-CM | POA: Diagnosis not present

## 2020-08-20 DIAGNOSIS — M6281 Muscle weakness (generalized): Secondary | ICD-10-CM | POA: Diagnosis not present

## 2020-08-20 DIAGNOSIS — R296 Repeated falls: Secondary | ICD-10-CM

## 2020-08-20 NOTE — Therapy (Signed)
[place], [place], [address] Phone: [phone]   Fax:  [phone]  Physical Therapy Treatment  Patient Details  Name: [name] [name] MRN: [id number] Date of Birth: [birth year] Referring Provider (PT): Marton Redwood, MD    Encounter Date: 08/20/2020   PT End of Session - 08/20/20 1243    Visit Number 4    Number of Visits 17    Date for PT Re-Evaluation 09/24/20    Authorization Type [employer] Medicare: kx mod at 15th visit    Authorization Time Period 07/30/2020 - 09/24/2020    PT Start Time 1100    PT Stop Time 1144    PT Time Calculation (min) 44 min    Activity Tolerance Patient tolerated treatment well    Behavior During Therapy [facility] for tasks assessed/performed           Past Medical History:  Diagnosis Date  . Acid reflux disease   . Anemia    history of  . Arthritis    both hands  . Blood transfusion without reported diagnosis   . Cataract    bilateral cateracts removed  . Common migraine   . Diverticulosis    patient denies  . DJD (degenerative joint disease)   . Esophageal stricture   . Herniated disc   . Hiatal hernia 2009   EGD-Small   . History of colonic polyps   . Hyperlipidemia   . Lacunar infarction ([place])   . Macular degeneration   . Macular degeneration 2016   per husband  . Osteoporosis   . Sleep apnea    has c-pap but does not wear  . Spinal stress fracture 2019   per husband 09/29/2018    Past Surgical History:  Procedure Laterality Date  . CHOLECYSTECTOMY     4-5 YRS AGO.  Marland Kitchen COLONOSCOPY    . ESOPHAGEAL DILATION    . EYE SURGERY     BILATERAL CATARAT  . INGUINAL HERNIA REPAIR     rt.  Marland Kitchen RECTOCELE REPAIR N/A 02/13/2013   Procedure: POSTERIOR REPAIR (RECTOCELE);  Surgeon: Emily Filbert, MD;  Location: [facility];  Service: Gynecology;  Laterality: N/A;  . ROBOTIC ASSISTED LAPAROSCOPIC LYSIS OF ADHESION  02/13/2016   Procedure: ROBOTIC ASSISTED LAPAROSCOPIC LYSIS  OF ADHESION;  Surgeon: Princess Bruins, MD;  Location: [facility];  Service: Gynecology;;  . ROBOTIC ASSISTED LAPAROSCOPIC SACROCOLPOPEXY N/A 02/13/2016   Procedure: ROBOTIC ASSISTED LAPAROSCOPIC SACROCOLPOPEXY With Theola Sequin;  Surgeon: Princess Bruins, MD;  Location: [facility];  Service: Gynecology;  Laterality: N/A;  . TOTAL ABDOMINAL HYSTERECTOMY    . UPPER GASTROINTESTINAL ENDOSCOPY      There were no vitals filed for this visit.   Subjective Assessment - 08/20/20 1107    Subjective Patient has no complaints today. She reports no falls over the weekend.    Pertinent History Macular degeneration, hx of cataracts (B extracted), Chronic daily HA, OSA on CPAP (but pt reports she does not use CPAP because it falls off and she doesn't like it), dysphagia, DJD, spondylisthesis of L3 on L4 and L4 on L5, HLD    How long can you sit comfortably? unlimited    How long can you stand comfortably? 10 - 20 min    How long can you walk comfortably? 10 - 20 min    Patient Stated Goals to  improve get the rid of the Rollering walk,    Currently in Pain? No/denies   general arthritis pain with  the rain                            [facility] Adult PT Treatment/Exercise - 08/20/20 0001      High Level Balance   High Level Balance Comments narrow base of support eo and ex on and off a balance mat 2x30 sec each; tandem stance on ground 2x30 sec each leg       Knee/Hip Exercises: Aerobic   Nustep L5 x 5 min LE only      Knee/Hip Exercises: Seated   Long Arc Quad Strengthening;2 sets;15 reps;Both    Long Arc Quad Weight 4 lbs.    Marching 2 sets;15 reps;Both   seated marching on airex pad   Sit to Sand --   sit to stand 2x5 with mod cuing and min a without hands      Knee/Hip Exercises: Supine   Bridges Strengthening;Both;1 set;10 reps    Straight Leg Raises 2 sets   12 reps     Knee/Hip Exercises: Sidelying   Clams 2 x 12 bil with red theraband                  PT Education  - 08/20/20 1239    Education Details benefits of balance training    Person(s) Educated Patient    Methods Explanation;Demonstration;Tactile cues;Verbal cues    Comprehension Verbal cues required;Returned demonstration;Tactile cues required;Verbalized understanding            PT Short Term Goals - 07/30/20 1235      PT SHORT TERM GOAL #1   Title pt to be IND with initial HEp    Time 4    Period Weeks    Status New    Target Date 08/27/20      PT SHORT TERM GOAL #2   Title per pt and husband to reduce falling to </=4 in 1 week time frame for therapuetic progression    Time 4    Period Weeks    Status New    Target Date 08/27/20      PT SHORT TERM GOAL #3   Title improve Berg balance to >/ 32/56 to promote safety/ stability    Time 4    Period Weeks    Status New    Target Date 08/27/20             PT Long Term Goals - 08/07/20 1415      PT LONG TERM GOAL #6   Title improve Tug time to </= 18 seconds with RW to maximize function and safety    Baseline -    Status New                 Plan - 08/20/20 1244    Clinical Impression Statement Patient continues to have loose balance backwards standing without hands. With cuing she was able to keep her balance better. Patient perfromed balance activity. She did well with balance on and off the balance pad. she required min gaurd with balance pad and supervision without regardless of eyes opened or closed. She tolerated ther-ex well. Therapy wqill review blance gaurding with the patients husband next visit.    Personal Factors and Comorbidities Comorbidity 3+;Age;Past/Current Experience    Comorbidities hx of subdural hematoma, osteopenia/ porosis,anemia, hx of falls    Examination-Activity Limitations Lift;Locomotion Level;Reach Overhead;Stand;Squat;Stairs    Stability/Clinical Decision Making Unstable/Unpredictable    Clinical Decision Making Moderate  Rehab Potential Good    PT Frequency 2x / week    PT  Duration 8 weeks    PT Treatment/Interventions ADLs/Self Care Home Management;Functional mobility training;Gait training;Stair training;Patient/family education;Orthotic Fit/Training;DME Instruction;Neuromuscular re-education;Balance training;Therapeutic exercise;Therapeutic activities;Manual techniques;Vestibular;Passive range of motion;Taping;Cryotherapy;Moist Heat    PT Next Visit Plan patient husband will come back and learn how to gaurd the patient    PT [place] set, seated clamshell, LAQ, seated marching, bridge, seated heel raise, sidelying clamshell, SLR and seated trunk rocking    Consulted and Agree with Plan of Care Patient    Family Member Consulted Jim: husband           Patient will benefit from skilled therapeutic intervention in order to improve the following deficits and impairments:  Abnormal gait, Decreased balance, Decreased mobility, Decreased endurance, Decreased knowledge of use of DME, Decreased range of motion, Decreased strength, Increased muscle spasms, Improper body mechanics, Postural dysfunction, Pain  Visit Diagnosis: Unsteadiness on feet  Muscle weakness (generalized)  Repeated falls     Problem List Patient Active Problem List   Diagnosis Date Noted  . Advanced nonexudative age-related macular degeneration of both eyes with subfoveal involvement 07/01/2020  . Degenerative retinal drusen of both eyes 07/01/2020  . Pseudophakia 07/01/2020  . Postoperative state 02/13/2016  . Chest pain with moderate risk of acute coronary syndrome 10/08/2014  . Sepsis syndrome 06/26/2013  . Acute pyelonephritis 06/26/2013  . Migraine 03/28/2013  . Rectocele 02/13/2013  . Sleep apnea 02/16/2012  . Chronic daily headache 01/12/2012  . OSTEOPENIA 10/17/2008  . ESOPHAGEAL STRICTURE 05/02/2008  . DIVERTICULOSIS-COLON 05/02/2008  . DIVERTICULITIS-COLON 05/02/2008  . ABDOMINAL PAIN-EPIGASTRIC 05/02/2008  . GASTRITIS, ACUTE 03/27/2008  .  LEG CRAMPS, NOCTURNAL 02/10/2008  . Headache(784.0) 02/10/2008  . OTHER DYSPHAGIA 02/10/2008  . HYPERLIPIDEMIA 02/06/2008  . LACUNAR INFARCTION 02/06/2008  . COLONIC POLYPS 10/13/2007  . Migraine without aura 09/27/2007  . Esophageal reflux 09/27/2007  . HERNIATED DISC 09/27/2007    Carney Living PT DPT  08/20/2020, 12:49 PM  [facility] [address] [place], [place], [address] Phone: [phone]   Fax:  [phone]  Name: [name] [name] MRN: [id number] Date of Birth: 1936/10/14

## 2020-08-22 DIAGNOSIS — R296 Repeated falls: Secondary | ICD-10-CM | POA: Diagnosis not present

## 2020-08-22 DIAGNOSIS — R079 Chest pain, unspecified: Secondary | ICD-10-CM | POA: Diagnosis not present

## 2020-08-23 ENCOUNTER — Ambulatory Visit: Payer: Medicare HMO | Admitting: Physical Therapy

## 2020-08-23 ENCOUNTER — Other Ambulatory Visit: Payer: Self-pay

## 2020-08-23 ENCOUNTER — Encounter: Payer: Self-pay | Admitting: Physical Therapy

## 2020-08-23 DIAGNOSIS — M6281 Muscle weakness (generalized): Secondary | ICD-10-CM | POA: Diagnosis not present

## 2020-08-23 DIAGNOSIS — R2681 Unsteadiness on feet: Secondary | ICD-10-CM

## 2020-08-23 DIAGNOSIS — R296 Repeated falls: Secondary | ICD-10-CM | POA: Diagnosis not present

## 2020-08-23 NOTE — Therapy (Signed)
[name] [name], [place], [address] Phone: [phone]   Fax:  [phone]  Physical Therapy Treatment  Patient Details  Name: [name] [name] MRN: [id number] Date of Birth: 10/07/1936 Referring Provider (PT): Marton Redwood, MD    Encounter Date: 08/23/2020   PT End of Session - 08/23/20 1321    Visit Number 5    Number of Visits 17    Date for PT Re-Evaluation 09/24/20    Authorization Type [employer] Medicare: kx mod at 15th visit    Authorization Time Period 07/30/2020 - 09/24/2020    Authorization - Visit Number 2    Authorization - Number of Visits 16    Progress Note Due on Visit 10    PT Start Time 1100    PT Stop Time 1142    PT Time Calculation (min) 42 min    Activity Tolerance Patient tolerated treatment well    Behavior During Therapy [facility] for tasks assessed/performed           Past Medical History:  Diagnosis Date  . Acid reflux disease   . Anemia    history of  . Arthritis    both hands  . Blood transfusion without reported diagnosis   . Cataract    bilateral cateracts removed  . Common migraine   . Diverticulosis    patient denies  . DJD (degenerative joint disease)   . Esophageal stricture   . Herniated disc   . Hiatal hernia 2009   EGD-Small   . History of colonic polyps   . Hyperlipidemia   . Lacunar infarction ([place])   . Macular degeneration   . Macular degeneration 2016   per husband  . Osteoporosis   . Sleep apnea    has c-pap but does not wear  . Spinal stress fracture 2019   per husband 09/29/2018    Past Surgical History:  Procedure Laterality Date  . CHOLECYSTECTOMY     4-5 YRS AGO.  Marland Kitchen COLONOSCOPY    . ESOPHAGEAL DILATION    . EYE SURGERY     BILATERAL CATARAT  . INGUINAL HERNIA REPAIR     rt.  Marland Kitchen RECTOCELE REPAIR N/A 02/13/2013   Procedure: POSTERIOR REPAIR (RECTOCELE);  Surgeon: Emily Filbert, MD;  Location: [facility];  Service: Gynecology;  Laterality: N/A;  .  ROBOTIC ASSISTED LAPAROSCOPIC LYSIS OF ADHESION  02/13/2016   Procedure: ROBOTIC ASSISTED LAPAROSCOPIC LYSIS OF ADHESION;  Surgeon: Princess Bruins, MD;  Location: [facility];  Service: Gynecology;;  . ROBOTIC ASSISTED LAPAROSCOPIC SACROCOLPOPEXY N/A 02/13/2016   Procedure: ROBOTIC ASSISTED LAPAROSCOPIC SACROCOLPOPEXY With Theola Sequin;  Surgeon: Princess Bruins, MD;  Location: [facility];  Service: Gynecology;  Laterality: N/A;  . TOTAL ABDOMINAL HYSTERECTOMY    . UPPER GASTROINTESTINAL ENDOSCOPY      There were no vitals filed for this visit.   Subjective Assessment - 08/23/20 1319    Subjective Patient reports she sat dwon hard on the toilet yesterday. She has been working on her exercises    Pertinent History Macular degeneration, hx of cataracts (B extracted), Chronic daily HA, OSA on CPAP (but pt reports she does not use CPAP because it falls off and she doesn't like it), dysphagia, DJD, spondylisthesis of L3 on L4 and L4 on L5, HLD    How long can you sit comfortably? unlimited    How long can you stand comfortably? 10 - 20 min    How long can you walk comfortably? 10 -  20 min    Patient Stated Goals to  improve get the rid of the Rollering walk,    Currently in Pain? No/denies                             [facility] Adult PT Treatment/Exercise - 08/23/20 0001      High Level Balance   High Level Balance Comments narrow base of support eo and ex on and off a balance mat 2x30 sec each; tandem stance on ground 2x30 sec each leg ; backwards roicking with balance catch at sink x20; marching without holding on all with husband gaurding      Self-Care   Self-Care Other Self-Care Comments    Other Self-Care Comments  reviewed gauring and bike seat placement with patient and her husband      Knee/Hip Exercises: Seated   Long Arc Quad Strengthening;2 sets;15 reps;Both    Long Arc Quad Weight 4 lbs.    Marching 2 sets;15 reps;Both   seated marching on airex pad   Sit to Sand 2  sets;10 reps      Knee/Hip Exercises: Supine   Bridges Strengthening;Both;1 set;10 reps    Straight Leg Raises 2 sets   12 reps     Knee/Hip Exercises: Sidelying   Clams 2 x 12 bil with red theraband                  PT Education - 08/23/20 1321    Education Details reviewed gaurding with the patients husband    Person(s) Educated Patient    Methods Explanation;Demonstration;Tactile cues;Verbal cues    Comprehension Verbalized understanding;Returned demonstration;Verbal cues required;Tactile cues required            PT Short Term Goals - 07/30/20 1235      PT SHORT TERM GOAL #1   Title pt to be IND with initial HEp    Time 4    Period Weeks    Status New    Target Date 08/27/20      PT SHORT TERM GOAL #2   Title per pt and husband to reduce falling to </=4 in 1 week time frame for therapuetic progression    Time 4    Period Weeks    Status New    Target Date 08/27/20      PT SHORT TERM GOAL #3   Title improve Berg balance to >/ 32/56 to promote safety/ stability    Time 4    Period Weeks    Status New    Target Date 08/27/20             PT Long Term Goals - 08/07/20 1415      PT LONG TERM GOAL #6   Title improve Tug time to </= 18 seconds with RW to maximize function and safety    Baseline -    Status New                 Plan - 08/23/20 1322    Clinical Impression Statement Therapy reviewed balance and gaurding with the patient and the patients husband. She continues to loose her balance posteriorly. She was given balance exercises to work on this. her husband was advised to gaurd the patient closely.    Personal Factors and Comorbidities Comorbidity 3+;Age;Past/Current Experience    Comorbidities hx of subdural hematoma, osteopenia/ porosis,anemia, hx of falls    Examination-Activity Limitations Lift;Locomotion Level;Reach Overhead;Stand;Squat;Stairs  Stability/Clinical Decision Making Unstable/Unpredictable    Clinical Decision Making  Moderate    Rehab Potential Good    PT Frequency 2x / week    PT Duration 8 weeks    PT Treatment/Interventions ADLs/Self Care Home Management;Functional mobility training;Gait training;Stair training;Patient/family education;Orthotic Fit/Training;DME Instruction;Neuromuscular re-education;Balance training;Therapeutic exercise;Therapeutic activities;Manual techniques;Vestibular;Passive range of motion;Taping;Cryotherapy;Moist Heat    PT Next Visit Plan patient husband will come back and learn how to gaurd the patient    PT [place] set, seated clamshell, LAQ, seated marching, bridge, seated heel raise, sidelying clamshell, SLR and seated trunk rocking    Consulted and Agree with Plan of Care Patient    Family Member Consulted Jim: husband           Patient will benefit from skilled therapeutic intervention in order to improve the following deficits and impairments:  Abnormal gait, Decreased balance, Decreased mobility, Decreased endurance, Decreased knowledge of use of DME, Decreased range of motion, Decreased strength, Increased muscle spasms, Improper body mechanics, Postural dysfunction, Pain  Visit Diagnosis: Unsteadiness on feet  Muscle weakness (generalized)  Repeated falls     Problem List Patient Active Problem List   Diagnosis Date Noted  . Advanced nonexudative age-related macular degeneration of both eyes with subfoveal involvement 07/01/2020  . Degenerative retinal drusen of both eyes 07/01/2020  . Pseudophakia 07/01/2020  . Postoperative state 02/13/2016  . Chest pain with moderate risk of acute coronary syndrome 10/08/2014  . Sepsis syndrome 06/26/2013  . Acute pyelonephritis 06/26/2013  . Migraine 03/28/2013  . Rectocele 02/13/2013  . Sleep apnea 02/16/2012  . Chronic daily headache 01/12/2012  . OSTEOPENIA 10/17/2008  . ESOPHAGEAL STRICTURE 05/02/2008  . DIVERTICULOSIS-COLON 05/02/2008  . DIVERTICULITIS-COLON 05/02/2008  .  ABDOMINAL PAIN-EPIGASTRIC 05/02/2008  . GASTRITIS, ACUTE 03/27/2008  . LEG CRAMPS, NOCTURNAL 02/10/2008  . Headache(784.0) 02/10/2008  . OTHER DYSPHAGIA 02/10/2008  . HYPERLIPIDEMIA 02/06/2008  . LACUNAR INFARCTION 02/06/2008  . COLONIC POLYPS 10/13/2007  . Migraine without aura 09/27/2007  . Esophageal reflux 09/27/2007  . HERNIATED DISC 09/27/2007    Carney Living PT DPT  08/23/2020, 1:28 PM  [facility] [address] [place], [place], [address] Phone: [phone]   Fax:  [phone]  Name: TAMBERLYN MIDGLEY MRN: [id number] Date of Birth: Feb 06, 1936

## 2020-08-26 ENCOUNTER — Ambulatory Visit: Payer: Medicare HMO | Admitting: Physical Therapy

## 2020-08-26 ENCOUNTER — Other Ambulatory Visit: Payer: Self-pay

## 2020-08-26 DIAGNOSIS — R2681 Unsteadiness on feet: Secondary | ICD-10-CM | POA: Diagnosis not present

## 2020-08-26 DIAGNOSIS — R296 Repeated falls: Secondary | ICD-10-CM | POA: Diagnosis not present

## 2020-08-26 DIAGNOSIS — M6281 Muscle weakness (generalized): Secondary | ICD-10-CM

## 2020-08-26 NOTE — Therapy (Signed)
[place], [place], [address] Phone: [phone]   Fax:  [phone]  Physical Therapy Treatment  Patient Details  Name: [name] [name] MRN: [id number] Date of Birth: Feb 28, 1936 Referring Provider (PT): Marton Redwood, MD    Encounter Date: 08/26/2020   PT End of Session - 08/27/20 0816    Visit Number 6    Number of Visits 17    Date for PT Re-Evaluation 09/24/20    Authorization Type [employer] Medicare: kx mod at 15th visit    Authorization Time Period 07/30/2020 - 09/24/2020    Authorization - Visit Number 6    Authorization - Number of Visits 16    Progress Note Due on Visit 10    PT Start Time 1100    PT Stop Time 1143    PT Time Calculation (min) 43 min    Activity Tolerance Patient tolerated treatment well    Behavior During Therapy [facility] for tasks assessed/performed           Past Medical History:  Diagnosis Date  . Acid reflux disease   . Anemia    history of  . Arthritis    both hands  . Blood transfusion without reported diagnosis   . Cataract    bilateral cateracts removed  . Common migraine   . Diverticulosis    patient denies  . DJD (degenerative joint disease)   . Esophageal stricture   . Herniated disc   . Hiatal hernia 2009   EGD-Small   . History of colonic polyps   . Hyperlipidemia   . Lacunar infarction ([place])   . Macular degeneration   . Macular degeneration 2016   per husband  . Osteoporosis   . Sleep apnea    has c-pap but does not wear  . Spinal stress fracture 2019   per husband 09/29/2018    Past Surgical History:  Procedure Laterality Date  . CHOLECYSTECTOMY     4-5 YRS AGO.  Marland Kitchen COLONOSCOPY    . ESOPHAGEAL DILATION    . EYE SURGERY     BILATERAL CATARAT  . INGUINAL HERNIA REPAIR     rt.  Marland Kitchen RECTOCELE REPAIR N/A 02/13/2013   Procedure: POSTERIOR REPAIR (RECTOCELE);  Surgeon: Emily Filbert, MD;  Location: [facility];  Service: Gynecology;  Laterality: N/A;  .  ROBOTIC ASSISTED LAPAROSCOPIC LYSIS OF ADHESION  02/13/2016   Procedure: ROBOTIC ASSISTED LAPAROSCOPIC LYSIS OF ADHESION;  Surgeon: Princess Bruins, MD;  Location: [facility];  Service: Gynecology;;  . ROBOTIC ASSISTED LAPAROSCOPIC SACROCOLPOPEXY N/A 02/13/2016   Procedure: ROBOTIC ASSISTED LAPAROSCOPIC SACROCOLPOPEXY With Theola Sequin;  Surgeon: Princess Bruins, MD;  Location: [facility];  Service: Gynecology;  Laterality: N/A;  . TOTAL ABDOMINAL HYSTERECTOMY    . UPPER GASTROINTESTINAL ENDOSCOPY      There were no vitals filed for this visit.   Subjective Assessment - 08/27/20 0812    Subjective Patient had no falls ovcer the weekend. She has been workin on her exercises. She was able to work on the exercise bike at her gym,    Pertinent History Macular degeneration, hx of cataracts (B extracted), Chronic daily HA, OSA on CPAP (but pt reports she does not use CPAP because it falls off and she doesn't like it), dysphagia, DJD, spondylisthesis of L3 on L4 and L4 on L5, HLD    How long can you sit comfortably? unlimited    How long can you stand comfortably? 10 - 20 min  How long can you walk comfortably? 10 - 20 min    Patient Stated Goals to  improve get the rid of the Rollering walk,    Currently in Pain? No/denies                             [facility] Adult PT Treatment/Exercise - 08/27/20 0821      High Level Balance   High Level Balance Comments narrow base of support eo and ex on and off a balance mat 2x30 sec each; tandem stance on ground 2x30 sec each leg ; backwards roicking with balance catch at sink x20; marching on air-ex       Knee/Hip Exercises: Aerobic   Nustep L5 x 5 min LE only      Knee/Hip Exercises: Seated   Long Arc Quad Strengthening;2 sets;15 reps;Both    Long [employer] Limitations reviewed how to use her weights    Marching 2 sets;15 reps;Both   seated marching on airex pad   Marching Limitations 4lb     Sit to Sand 2 sets;10 reps   significant itme  spent working on technique      Knee/Hip Exercises: Supine   Straight Leg Raises --   12 reps                 PT Education - 08/27/20 0815    Education Details reviewed balance exercises and use of universal cuff weight    Person(s) Educated Patient    Methods Explanation;Demonstration;Tactile cues;Verbal cues    Comprehension Verbalized understanding;Verbal cues required;Tactile cues required            PT Short Term Goals - 07/30/20 1235      PT SHORT TERM GOAL #1   Title pt to be IND with initial HEp    Time 4    Period Weeks    Status New    Target Date 08/27/20      PT SHORT TERM GOAL #2   Title per pt and husband to reduce falling to </=4 in 1 week time frame for therapuetic progression    Time 4    Period Weeks    Status New    Target Date 08/27/20      PT SHORT TERM GOAL #3   Title improve Berg balance to >/ 32/56 to promote safety/ stability    Time 4    Period Weeks    Status New    Target Date 08/27/20             PT Long Term Goals - 08/07/20 1415      PT LONG TERM GOAL #6   Title improve Tug time to </= 18 seconds with RW to maximize function and safety    Baseline -    Status New                 Plan - 08/27/20 0816    Clinical Impression Statement Patient brough her universal cuff weight. Therapy showed her how to use it. She used 3lbs this visit but has used 4 in the past. Her left leg continues to be the most limited. Thepay continues to work on standing without pushing off with her posterior legs. Therpay used assist and tactile cues to shift weight forward,.We also continue to wrok on posteriotr balance. Therapy will continue to work on standing bsalance and technique with the patient.    Personal Factors and Comorbidities Comorbidity  3+;Age;Past/Current Experience    Comorbidities hx of subdural hematoma, osteopenia/ porosis,anemia, hx of falls    Examination-Activity Limitations Lift;Locomotion Level;Reach  Overhead;Stand;Squat;Stairs    Stability/Clinical Decision Making Unstable/Unpredictable    Clinical Decision Making Moderate    Rehab Potential Good    PT Frequency 2x / week    PT Duration 8 weeks    PT Treatment/Interventions ADLs/Self Care Home Management;Functional mobility training;Gait training;Stair training;Patient/family education;Orthotic Fit/Training;DME Instruction;Neuromuscular re-education;Balance training;Therapeutic exercise;Therapeutic activities;Manual techniques;Vestibular;Passive range of motion;Taping;Cryotherapy;Moist Heat    PT Next Visit Plan patient husband will come back and learn how to gaurd the patient    PT [place] set, seated clamshell, LAQ, seated marching, bridge, seated heel raise, sidelying clamshell, SLR and seated trunk rocking           Patient will benefit from skilled therapeutic intervention in order to improve the following deficits and impairments:  Abnormal gait, Decreased balance, Decreased mobility, Decreased endurance, Decreased knowledge of use of DME, Decreased range of motion, Decreased strength, Increased muscle spasms, Improper body mechanics, Postural dysfunction, Pain  Visit Diagnosis: Unsteadiness on feet  Muscle weakness (generalized)  Repeated falls     Problem List Patient Active Problem List   Diagnosis Date Noted  . Advanced nonexudative age-related macular degeneration of both eyes with subfoveal involvement 07/01/2020  . Degenerative retinal drusen of both eyes 07/01/2020  . Pseudophakia 07/01/2020  . Postoperative state 02/13/2016  . Chest pain with moderate risk of acute coronary syndrome 10/08/2014  . Sepsis syndrome 06/26/2013  . Acute pyelonephritis 06/26/2013  . Migraine 03/28/2013  . Rectocele 02/13/2013  . Sleep apnea 02/16/2012  . Chronic daily headache 01/12/2012  . OSTEOPENIA 10/17/2008  . ESOPHAGEAL STRICTURE 05/02/2008  . DIVERTICULOSIS-COLON 05/02/2008  .  DIVERTICULITIS-COLON 05/02/2008  . ABDOMINAL PAIN-EPIGASTRIC 05/02/2008  . GASTRITIS, ACUTE 03/27/2008  . LEG CRAMPS, NOCTURNAL 02/10/2008  . Headache(784.0) 02/10/2008  . OTHER DYSPHAGIA 02/10/2008  . HYPERLIPIDEMIA 02/06/2008  . LACUNAR INFARCTION 02/06/2008  . COLONIC POLYPS 10/13/2007  . Migraine without aura 09/27/2007  . Esophageal reflux 09/27/2007  . HERNIATED DISC 09/27/2007    Carney Living PT DPT  08/27/2020, 8:24 AM  [facility] [address] [place], [place], [address] Phone: [phone]   Fax:  [phone]  Name: [name] [name] MRN: [id number] Date of Birth: 1936/01/18

## 2020-08-27 ENCOUNTER — Encounter: Payer: Self-pay | Admitting: Physical Therapy

## 2020-08-30 ENCOUNTER — Encounter: Payer: Medicare HMO | Admitting: Physical Therapy

## 2020-08-30 DIAGNOSIS — L814 Other melanin hyperpigmentation: Secondary | ICD-10-CM | POA: Diagnosis not present

## 2020-08-30 DIAGNOSIS — L821 Other seborrheic keratosis: Secondary | ICD-10-CM | POA: Diagnosis not present

## 2020-08-30 DIAGNOSIS — L281 Prurigo nodularis: Secondary | ICD-10-CM | POA: Diagnosis not present

## 2020-08-30 DIAGNOSIS — B070 Plantar wart: Secondary | ICD-10-CM | POA: Diagnosis not present

## 2020-08-30 DIAGNOSIS — L298 Other pruritus: Secondary | ICD-10-CM | POA: Diagnosis not present

## 2020-09-04 DIAGNOSIS — Z23 Encounter for immunization: Secondary | ICD-10-CM | POA: Diagnosis not present

## 2020-09-06 ENCOUNTER — Ambulatory Visit: Payer: Medicare HMO | Attending: Internal Medicine | Admitting: Physical Therapy

## 2020-09-06 ENCOUNTER — Encounter: Payer: Self-pay | Admitting: Physical Therapy

## 2020-09-06 ENCOUNTER — Other Ambulatory Visit: Payer: Self-pay

## 2020-09-06 DIAGNOSIS — R2681 Unsteadiness on feet: Secondary | ICD-10-CM | POA: Insufficient documentation

## 2020-09-06 DIAGNOSIS — R296 Repeated falls: Secondary | ICD-10-CM | POA: Insufficient documentation

## 2020-09-06 DIAGNOSIS — R2689 Other abnormalities of gait and mobility: Secondary | ICD-10-CM | POA: Insufficient documentation

## 2020-09-06 DIAGNOSIS — M6281 Muscle weakness (generalized): Secondary | ICD-10-CM | POA: Diagnosis not present

## 2020-09-06 NOTE — Therapy (Signed)
[place], [place], [address] Phone: [phone]   Fax:  [phone]  Physical Therapy Treatment  Patient Details  Name: [name] [name] MRN: [id number] Date of Birth: Mar 30, 1936 Referring Provider (PT): Marton Redwood, MD    Encounter Date: 09/06/2020   PT End of Session - 09/06/20 1105    Visit Number 7    Number of Visits 17    Date for PT Re-Evaluation 09/24/20    Authorization Type [employer] Medicare: kx mod at 15th visit    Authorization Time Period 07/30/2020 - 09/24/2020    Authorization - Visit Number 7    Authorization - Number of Visits 16    Progress Note Due on Visit 10    PT Start Time 1100    PT Stop Time 1142    PT Time Calculation (min) 42 min    Equipment Utilized During Treatment Gait belt    Activity Tolerance Patient tolerated treatment well    Behavior During Therapy WFL for tasks assessed/performed           Past Medical History:  Diagnosis Date  . Acid reflux disease   . Anemia    history of  . Arthritis    both hands  . Blood transfusion without reported diagnosis   . Cataract    bilateral cateracts removed  . Common migraine   . Diverticulosis    patient denies  . DJD (degenerative joint disease)   . Esophageal stricture   . Herniated disc   . Hiatal hernia 2009   EGD-Small   . History of colonic polyps   . Hyperlipidemia   . Lacunar infarction ([place])   . Macular degeneration   . Macular degeneration 2016   per husband  . Osteoporosis   . Sleep apnea    has c-pap but does not wear  . Spinal stress fracture 2019   per husband 09/29/2018    Past Surgical History:  Procedure Laterality Date  . CHOLECYSTECTOMY     4-5 YRS AGO.  Marland Kitchen COLONOSCOPY    . ESOPHAGEAL DILATION    . EYE SURGERY     BILATERAL CATARAT  . INGUINAL HERNIA REPAIR     rt.  Marland Kitchen RECTOCELE REPAIR N/A 02/13/2013   Procedure: POSTERIOR REPAIR (RECTOCELE);  Surgeon: Emily Filbert, MD;  Location: [facility];  Service: Gynecology;  Laterality: N/A;  . ROBOTIC ASSISTED LAPAROSCOPIC LYSIS OF ADHESION  02/13/2016   Procedure: ROBOTIC ASSISTED LAPAROSCOPIC LYSIS OF ADHESION;  Surgeon: Princess Bruins, MD;  Location: [facility];  Service: Gynecology;;  . ROBOTIC ASSISTED LAPAROSCOPIC SACROCOLPOPEXY N/A 02/13/2016   Procedure: ROBOTIC ASSISTED LAPAROSCOPIC SACROCOLPOPEXY With Theola Sequin;  Surgeon: Princess Bruins, MD;  Location: [facility];  Service: Gynecology;  Laterality: N/A;  . TOTAL ABDOMINAL HYSTERECTOMY    . UPPER GASTROINTESTINAL ENDOSCOPY      There were no vitals filed for this visit.   Subjective Assessment - 09/06/20 1106    Subjective Pt reports having no falls since her last treatment session. Pt states she has been doing home exercises and does not have any pain upon arrival today.    Pertinent History Macular degeneration, hx of cataracts (B extracted), Chronic daily HA, OSA on CPAP (but pt reports she does not use CPAP because it falls off and she doesn't like it), dysphagia, DJD, spondylisthesis of L3 on L4 and L4 on L5, HLD    How long can you sit comfortably? unlimited    How long  can you stand comfortably? 10 - 20 min    How long can you walk comfortably? 10 - 20 min    Patient Stated Goals to  improve get the rid of the Rollering walk,    Currently in Pain? No/denies    Pain Score 0-No pain    Pain Location Hip    Pain Orientation Left    Pain Descriptors / Indicators Aching    Pain Type Chronic pain              [facility] PT Assessment - 09/06/20 0001      Assessment   Medical Diagnosis Repeated falls R29.6    Referring Provider (PT) Marton Redwood, MD                          [facility] Adult PT Treatment/Exercise - 09/06/20 0001      High Level Balance   High Level Balance Comments narrow base of support eo and ex on and off a balance mat 2x30 sec each; tandem stance on ground 4x10 sec each leg; marching on air-ex       Lumbar Exercises: Seated   Sit to  Stand Limitations 2x10 sit to stand to practice technique and avoid leaning backwards upon standing; pt education on leaning forward, optimal hand placement      Knee/Hip Exercises: Aerobic   Nustep L5 x 5 min LE only      Knee/Hip Exercises: Seated   Marching 2 sets;15 reps;Both   standing marches on airex pad   Sit to [employer] 2 sets;10 reps   verbal and tactile cues for technique to avoid leaning back     Knee/Hip Exercises: Supine   Straight Leg Raises Limitations supine straight leg raises 15x BLE      Knee/Hip Exercises: Sidelying   Hip ABduction Limitations sidelying clamshells 10x each BLE                    PT Short Term Goals - 07/30/20 1235      PT SHORT TERM GOAL #1   Title pt to be IND with initial HEp    Time 4    Period Weeks    Status New    Target Date 08/27/20      PT SHORT TERM GOAL #2   Title per pt and husband to reduce falling to </=4 in 1 week time frame for therapuetic progression    Time 4    Period Weeks    Status New    Target Date 08/27/20      PT SHORT TERM GOAL #3   Title improve Berg balance to >/ 32/56 to promote safety/ stability    Time 4    Period Weeks    Status New    Target Date 08/27/20             PT Long Term Goals - 08/07/20 1415      PT LONG TERM GOAL #6   Title improve Tug time to </= 18 seconds with RW to maximize function and safety    Baseline -    Status New                 Plan - 09/06/20 1153    Clinical Impression Statement Pt tolerated treatment session well with no complaints of pain with exception of some soreness that has been present along sacrum after falling back onto the toilet too hard last week when  using the restroom. Verbal and tactile cues provided to shift weight forward during sit to stands. Pt will continue to benefit from skilled PT intervention to improve balance and functional strength for increased safety at home.    Personal Factors and Comorbidities Comorbidity  3+;Age;Past/Current Experience    Comorbidities hx of subdural hematoma, osteopenia/ porosis,anemia, hx of falls    Examination-Activity Limitations Lift;Locomotion Level;Reach Overhead;Stand;Squat;Stairs    PT Treatment/Interventions ADLs/Self Care Home Management;Functional mobility training;Gait training;Stair training;Patient/family education;Orthotic Fit/Training;DME Instruction;Neuromuscular re-education;Balance training;Therapeutic exercise;Therapeutic activities;Manual techniques;Vestibular;Passive range of motion;Taping;Cryotherapy;Moist Heat    PT Next Visit Plan patient husband will come back and learn how to guard the patient; dynamic sitting balance, BLE coordination activities    PT [place] set, seated clamshell, LAQ, seated marching, bridge, seated heel raise, sidelying clamshell, SLR and seated trunk rocking    Consulted and Agree with Plan of Care Patient;Family member/caregiver    Family Member Consulted Jim: husband           Patient will benefit from skilled therapeutic intervention in order to improve the following deficits and impairments:  Abnormal gait, Decreased balance, Decreased mobility, Decreased endurance, Decreased knowledge of use of DME, Decreased range of motion, Decreased strength, Increased muscle spasms, Improper body mechanics, Postural dysfunction, Pain  Visit Diagnosis: Unsteadiness on feet  Muscle weakness (generalized)  Repeated falls     Problem List Patient Active Problem List   Diagnosis Date Noted  . Advanced nonexudative age-related macular degeneration of both eyes with subfoveal involvement 07/01/2020  . Degenerative retinal drusen of both eyes 07/01/2020  . Pseudophakia 07/01/2020  . Postoperative state 02/13/2016  . Chest pain with moderate risk of acute coronary syndrome 10/08/2014  . Sepsis syndrome 06/26/2013  . Acute pyelonephritis 06/26/2013  . Migraine 03/28/2013  . Rectocele 02/13/2013  . Sleep  apnea 02/16/2012  . Chronic daily headache 01/12/2012  . OSTEOPENIA 10/17/2008  . ESOPHAGEAL STRICTURE 05/02/2008  . DIVERTICULOSIS-COLON 05/02/2008  . DIVERTICULITIS-COLON 05/02/2008  . ABDOMINAL PAIN-EPIGASTRIC 05/02/2008  . GASTRITIS, ACUTE 03/27/2008  . LEG CRAMPS, NOCTURNAL 02/10/2008  . Headache(784.0) 02/10/2008  . OTHER DYSPHAGIA 02/10/2008  . HYPERLIPIDEMIA 02/06/2008  . LACUNAR INFARCTION 02/06/2008  . COLONIC POLYPS 10/13/2007  . Migraine without aura 09/27/2007  . Esophageal reflux 09/27/2007  . HERNIATED DISC 09/27/2007   Haydee Monica, PT, DPT 09/06/20 12:01 PM  [facility] [address] [place], [place], [address] Phone: [phone]   Fax:  [phone]  Name: [name] [name] MRN: [id number] Date of Birth: [birth year]

## 2020-09-11 ENCOUNTER — Other Ambulatory Visit: Payer: Self-pay

## 2020-09-11 ENCOUNTER — Ambulatory Visit: Payer: Medicare HMO | Admitting: Physical Therapy

## 2020-09-11 DIAGNOSIS — R2681 Unsteadiness on feet: Secondary | ICD-10-CM | POA: Diagnosis not present

## 2020-09-11 DIAGNOSIS — M6281 Muscle weakness (generalized): Secondary | ICD-10-CM

## 2020-09-11 DIAGNOSIS — R296 Repeated falls: Secondary | ICD-10-CM | POA: Diagnosis not present

## 2020-09-11 DIAGNOSIS — R2689 Other abnormalities of gait and mobility: Secondary | ICD-10-CM | POA: Diagnosis not present

## 2020-09-11 NOTE — Therapy (Signed)
[place], [place], [address] Phone: [phone]   Fax:  [phone]  Physical Therapy Treatment  Patient Details  Name: [name] [name] MRN: [id number] Date of Birth: Jan 05, 1936 Referring Provider (PT): Marton Redwood, MD    Encounter Date: 09/11/2020   PT End of Session - 09/11/20 1108    Visit Number 8    Number of Visits 17    Date for PT Re-Evaluation 09/24/20    Authorization Type [employer] Medicare: kx mod at 15th visit    Authorization Time Period 07/30/2020 - 09/24/2020    Authorization - Visit Number 8    Authorization - Number of Visits 16    Progress Note Due on Visit 10    PT Start Time 1100    PT Stop Time 1145    PT Time Calculation (min) 45 min    Equipment Utilized During Treatment Gait belt    Activity Tolerance Patient tolerated treatment well    Behavior During Therapy WFL for tasks assessed/performed           Past Medical History:  Diagnosis Date  . Acid reflux disease   . Anemia    history of  . Arthritis    both hands  . Blood transfusion without reported diagnosis   . Cataract    bilateral cateracts removed  . Common migraine   . Diverticulosis    patient denies  . DJD (degenerative joint disease)   . Esophageal stricture   . Herniated disc   . Hiatal hernia 2009   EGD-Small   . History of colonic polyps   . Hyperlipidemia   . Lacunar infarction ([place])   . Macular degeneration   . Macular degeneration 2016   per husband  . Osteoporosis   . Sleep apnea    has c-pap but does not wear  . Spinal stress fracture 2019   per husband 09/29/2018    Past Surgical History:  Procedure Laterality Date  . CHOLECYSTECTOMY     4-5 YRS AGO.  Marland Kitchen COLONOSCOPY    . ESOPHAGEAL DILATION    . EYE SURGERY     BILATERAL CATARAT  . INGUINAL HERNIA REPAIR     rt.  Marland Kitchen RECTOCELE REPAIR N/A 02/13/2013   Procedure: POSTERIOR REPAIR (RECTOCELE);  Surgeon: Emily Filbert, MD;  Location:  [facility];  Service: Gynecology;  Laterality: N/A;  . ROBOTIC ASSISTED LAPAROSCOPIC LYSIS OF ADHESION  02/13/2016   Procedure: ROBOTIC ASSISTED LAPAROSCOPIC LYSIS OF ADHESION;  Surgeon: Princess Bruins, MD;  Location: [facility];  Service: Gynecology;;  . ROBOTIC ASSISTED LAPAROSCOPIC SACROCOLPOPEXY N/A 02/13/2016   Procedure: ROBOTIC ASSISTED LAPAROSCOPIC SACROCOLPOPEXY With Theola Sequin;  Surgeon: Princess Bruins, MD;  Location: [facility];  Service: Gynecology;  Laterality: N/A;  . TOTAL ABDOMINAL HYSTERECTOMY    . UPPER GASTROINTESTINAL ENDOSCOPY      There were no vitals filed for this visit.   Subjective Assessment - 09/11/20 1109    Subjective " I am doing pretty good, no falls. I do get some stiffness in the front of my hip but only when I am laying but i does gradually go away."    Patient Stated Goals to  improve get the rid of the Rollering walk,    Currently in Pain? No/denies              [facility] PT Assessment - 09/11/20 0001      Assessment   Medical Diagnosis Repeated falls R29.6  Referring Provider (PT) Marton Redwood, MD                          [facility] Adult PT Treatment/Exercise - 09/11/20 0001      Knee/Hip Exercises: Aerobic   Nustep L5 x 6 min LE only    gradually increase time for endurance     Knee/Hip Exercises: Standing   Hip Abduction Stengthening;Both;2 sets;10 reps;Knee straight   with 2.5#    Hip Extension --    Forward Step Up 2 sets;10 reps;Step Height: 4";Both    Functional Squat 1 set;10 reps   with bil HHA from //    Other Standing Knee Exercises marching 2 x 10 in //    with 2.5#   Other Standing Knee Exercises alternating toe taps on 4 in step  2 x 10 with 1 HHA for support PRN   in //     Knee/Hip Exercises: Seated   Long Arc Quad Strengthening;Both;2 sets;20 reps;Weights    Long Arc Quad Weight 3 lbs.    Sit to Sand 2 sets;10 reps               Balance Exercises - 09/11/20 0001      Balance Exercises: Standing    Standing Eyes Closed Narrow base of support (BOS);2 reps;30 secs;Solid surface    Partial Tandem Stance Eyes open;Eyes closed;4 reps;30 secs   alternating lead leg starting with EO x 10 sec, EC x 30              PT Short Term Goals - 07/30/20 1235      PT SHORT TERM GOAL #1   Title pt to be IND with initial HEp    Time 4    Period Weeks    Status New    Target Date 08/27/20      PT SHORT TERM GOAL #2   Title per pt and husband to reduce falling to </=4 in 1 week time frame for therapuetic progression    Time 4    Period Weeks    Status New    Target Date 08/27/20      PT SHORT TERM GOAL #3   Title improve Berg balance to >/ 32/56 to promote safety/ stability    Time 4    Period Weeks    Status New    Target Date 08/27/20             PT Long Term Goals - 08/07/20 1415      PT LONG TERM GOAL #6   Title improve Tug time to </= 18 seconds with RW to maximize function and safety    Baseline -    Status New                 Plan - 09/11/20 1120    Clinical Impression Statement pt reports no falls since the last session and continues to be consistency with her HEP. continued working on hip / knee strengthening progressing to CKC exercises using the // for support if needed, she did well with exercises but does demonstrate fatigue especially with standing marching, she did well with balance training in // demonstrate mild postural sway with modified tandem positioning.    PT Treatment/Interventions ADLs/Self Care Home Management;Functional mobility training;Gait training;Stair training;Patient/family education;Orthotic Fit/Training;DME Instruction;Neuromuscular re-education;Balance training;Therapeutic exercise;Therapeutic activities;Manual techniques;Vestibular;Passive range of motion;Taping;Cryotherapy;Moist Heat    PT Next Visit Plan patient husband will come back and learn how to  guard the patient; dynamic sitting/ standing balance balance, BLE coordination  activities, progress step up height.    PT [place] set, seated clamshell, LAQ, seated marching, bridge, seated heel raise, sidelying clamshell, SLR and seated trunk rocking    Consulted and Agree with Plan of Care Patient           Patient will benefit from skilled therapeutic intervention in order to improve the following deficits and impairments:  Abnormal gait, Decreased balance, Decreased mobility, Decreased endurance, Decreased knowledge of use of DME, Decreased range of motion, Decreased strength, Increased muscle spasms, Improper body mechanics, Postural dysfunction, Pain  Visit Diagnosis: Unsteadiness on feet  Muscle weakness (generalized)  Repeated falls     Problem List Patient Active Problem List   Diagnosis Date Noted  . Advanced nonexudative age-related macular degeneration of both eyes with subfoveal involvement 07/01/2020  . Degenerative retinal drusen of both eyes 07/01/2020  . Pseudophakia 07/01/2020  . Postoperative state 02/13/2016  . Chest pain with moderate risk of acute coronary syndrome 10/08/2014  . Sepsis syndrome 06/26/2013  . Acute pyelonephritis 06/26/2013  . Migraine 03/28/2013  . Rectocele 02/13/2013  . Sleep apnea 02/16/2012  . Chronic daily headache 01/12/2012  . OSTEOPENIA 10/17/2008  . ESOPHAGEAL STRICTURE 05/02/2008  . DIVERTICULOSIS-COLON 05/02/2008  . DIVERTICULITIS-COLON 05/02/2008  . ABDOMINAL PAIN-EPIGASTRIC 05/02/2008  . GASTRITIS, ACUTE 03/27/2008  . LEG CRAMPS, NOCTURNAL 02/10/2008  . Headache(784.0) 02/10/2008  . OTHER DYSPHAGIA 02/10/2008  . HYPERLIPIDEMIA 02/06/2008  . LACUNAR INFARCTION 02/06/2008  . COLONIC POLYPS 10/13/2007  . Migraine without aura 09/27/2007  . Esophageal reflux 09/27/2007  . HERNIATED DISC 09/27/2007   Starr Lake PT, DPT, LAT, ATC  09/11/20  12:41 PM      [facility] [address] [place], [place], [address]  Phone: [phone]   Fax:  [phone]  Name: [name] [name] MRN: [id number] Date of Birth: 1936-04-26

## 2020-09-17 ENCOUNTER — Telehealth: Payer: Self-pay | Admitting: Physical Therapy

## 2020-09-17 NOTE — Telephone Encounter (Signed)
Returned pt's call regarding a fall she has on Sunday. She reports still having 6/10 pain in the hip with laying that doesn't get worse with standing. I did recommend benefit of getting imaging to rule out any potential problems. They reported their son is a Tax adviser and didn't feel like that necessary, and that they plan to come to her next visit tomorrow.   Santo Zahradnik PT, DPT, LAT, ATC  09/17/20  11:44 AM

## 2020-09-18 ENCOUNTER — Other Ambulatory Visit: Payer: Self-pay

## 2020-09-18 ENCOUNTER — Encounter: Payer: Self-pay | Admitting: Physical Therapy

## 2020-09-18 ENCOUNTER — Ambulatory Visit: Payer: Medicare HMO | Admitting: Physical Therapy

## 2020-09-18 DIAGNOSIS — M6281 Muscle weakness (generalized): Secondary | ICD-10-CM | POA: Diagnosis not present

## 2020-09-18 DIAGNOSIS — R2689 Other abnormalities of gait and mobility: Secondary | ICD-10-CM | POA: Diagnosis not present

## 2020-09-18 DIAGNOSIS — R296 Repeated falls: Secondary | ICD-10-CM | POA: Diagnosis not present

## 2020-09-18 DIAGNOSIS — R2681 Unsteadiness on feet: Secondary | ICD-10-CM | POA: Diagnosis not present

## 2020-09-18 NOTE — Therapy (Signed)
[place], [place], [address] Phone: [phone]   Fax:  [phone]  Physical Therapy Treatment  Patient Details  Name: [name] [name] MRN: [id number] Date of Birth: [birth year] Referring Provider (PT): Marton Redwood, MD    Encounter Date: 09/18/2020   PT End of Session - 09/18/20 1103    Visit Number 9    Number of Visits 17    Date for PT Re-Evaluation 09/24/20    Authorization Type [employer]: kx mod at 15th visit    Authorization Time Period 07/30/2020 - 09/24/2020    Authorization - Visit Number 9    Authorization - Number of Visits 16    Progress Note Due on Visit 10    PT Start Time 1100    PT Stop Time 1145    PT Time Calculation (min) 45 min    Activity Tolerance Patient tolerated treatment well    Behavior During Therapy [facility] for tasks assessed/performed           Past Medical History:  Diagnosis Date  . Acid reflux disease   . Anemia    history of  . Arthritis    both hands  . Blood transfusion without reported diagnosis   . Cataract    bilateral cateracts removed  . Common migraine   . Diverticulosis    patient denies  . DJD (degenerative joint disease)   . Esophageal stricture   . Herniated disc   . Hiatal hernia 2009   EGD-Small   . History of colonic polyps   . Hyperlipidemia   . Lacunar infarction ([place])   . Macular degeneration   . Macular degeneration 2016   per husband  . Osteoporosis   . Sleep apnea    has c-pap but does not wear  . Spinal stress fracture 2019   per husband 09/29/2018    Past Surgical History:  Procedure Laterality Date  . CHOLECYSTECTOMY     4-5 YRS AGO.  Marland Kitchen COLONOSCOPY    . ESOPHAGEAL DILATION    . EYE SURGERY     BILATERAL CATARAT  . INGUINAL HERNIA REPAIR     rt.  Marland Kitchen RECTOCELE REPAIR N/A 02/13/2013   Procedure: POSTERIOR REPAIR (RECTOCELE);  Surgeon: Emily Filbert, MD;  Location: [facility];  Service: Gynecology;  Laterality: N/A;  .  ROBOTIC ASSISTED LAPAROSCOPIC LYSIS OF ADHESION  02/13/2016   Procedure: ROBOTIC ASSISTED LAPAROSCOPIC LYSIS OF ADHESION;  Surgeon: Princess Bruins, MD;  Location: [facility];  Service: Gynecology;;  . ROBOTIC ASSISTED LAPAROSCOPIC SACROCOLPOPEXY N/A 02/13/2016   Procedure: ROBOTIC ASSISTED LAPAROSCOPIC SACROCOLPOPEXY With Theola Sequin;  Surgeon: Princess Bruins, MD;  Location: [facility];  Service: Gynecology;  Laterality: N/A;  . TOTAL ABDOMINAL HYSTERECTOMY    . UPPER GASTROINTESTINAL ENDOSCOPY      There were no vitals filed for this visit.   Subjective Assessment - 09/18/20 1108    Subjective " I had a fall on Sunday in the Bathroom  and landed on the R hip and my R head. My son is a MD and stated I didn't need imaging, he glued the cut on my head. I am doing better today."    Pain Score 4     Pain Location Hip    Pain Orientation Right              [facility] PT Assessment - 09/18/20 0001      Assessment   Medical Diagnosis Repeated falls R29.6  Referring Provider (PT) Marton Redwood, MD                          [facility] Adult PT Treatment/Exercise - 09/18/20 0001      Knee/Hip Exercises: Aerobic   Nustep L5 x 7 min LE only     to promote endurance     Knee/Hip Exercises: Standing   Heel Raises 2 sets;Both;15 reps    Hip Abduction Stengthening;Both;2 sets;15 reps;Knee straight   in //   Hip Extension Stengthening;Both;2 sets;15 reps;Knee straight   in //   Forward Step Up Step Height: 6";2 sets;10 reps;Both    Functional Squat 2 sets;10 reps   mini squat in //   Other Standing Knee Exercises marching 2 x 10  alternating in // with bil HHA, 2 x 10 with LUE HHA only    Other Standing Knee Exercises alternating foot taps on 6 inch step in // 2 x 12               Balance Exercises - 09/18/20 0001      Balance Exercises: Standing   Other Standing Exercises ankle strategy utiziong ifttier rocker board in // 2 x 10 tapping front/ back of board with bil HHA, 1 x 10  with LUE HHA/ RUE. 1 x 10 without UE assist with CGA               PT Short Term Goals - 09/18/20 1240      PT SHORT TERM GOAL #1   Title pt to be IND with initial HEp    Period Weeks    Status Achieved      PT SHORT TERM GOAL #2   Title per pt and husband to reduce falling to </=4 in 1 week time frame for therapuetic progression    Period Weeks    Status Achieved      PT SHORT TERM GOAL #3   Title improve Berg balance to >/ 32/56 to promote safety/ stability    Period Weeks    Status Unable to assess             PT Long Term Goals - 08/07/20 1415      PT LONG TERM GOAL #6   Title improve Tug time to </= 18 seconds with RW to maximize function and safety    Baseline -    Status New                 Plan - 09/18/20 1235    Clinical Impression Statement pt arrives to PT reporting a fall that occured on sunday while she was in the bathroom reporting she hit her head causing a laceration and R posterior hip pain which her son (who they stated is a MD) glued the forhead laceration and deemed it not necessary to get imaging for the R hip. continued working  in [facility] in the // for strengthening which she did well with monitoring for R hip pain throughout session. she demonstrated difficulty with balance with ankle strategy focus using rocker board. no report of incrased pain or aggrivation end of session.    PT Treatment/Interventions ADLs/Self Care Home Management;Functional mobility training;Gait training;Stair training;Patient/family education;Orthotic Fit/Training;DME Instruction;Neuromuscular re-education;Balance training;Therapeutic exercise;Therapeutic activities;Manual techniques;Vestibular;Passive range of motion;Taping;Cryotherapy;Moist Heat    PT Next Visit Plan patient husband will come back and learn how to guard the patient; dynamic sitting/ standing balance balance, BLE coordination activities, progress step up height.  Consulted and Agree with Plan of Care  Patient           Patient will benefit from skilled therapeutic intervention in order to improve the following deficits and impairments:  Abnormal gait, Decreased balance, Decreased mobility, Decreased endurance, Decreased knowledge of use of DME, Decreased range of motion, Decreased strength, Increased muscle spasms, Improper body mechanics, Postural dysfunction, Pain  Visit Diagnosis: Unsteadiness on feet  Muscle weakness (generalized)  Repeated falls     Problem List Patient Active Problem List   Diagnosis Date Noted  . Advanced nonexudative age-related macular degeneration of both eyes with subfoveal involvement 07/01/2020  . Degenerative retinal drusen of both eyes 07/01/2020  . Pseudophakia 07/01/2020  . Postoperative state 02/13/2016  . Chest pain with moderate risk of acute coronary syndrome 10/08/2014  . Sepsis syndrome 06/26/2013  . Acute pyelonephritis 06/26/2013  . Migraine 03/28/2013  . Rectocele 02/13/2013  . Sleep apnea 02/16/2012  . Chronic daily headache 01/12/2012  . OSTEOPENIA 10/17/2008  . ESOPHAGEAL STRICTURE 05/02/2008  . DIVERTICULOSIS-COLON 05/02/2008  . DIVERTICULITIS-COLON 05/02/2008  . ABDOMINAL PAIN-EPIGASTRIC 05/02/2008  . GASTRITIS, ACUTE 03/27/2008  . LEG CRAMPS, NOCTURNAL 02/10/2008  . Headache(784.0) 02/10/2008  . OTHER DYSPHAGIA 02/10/2008  . HYPERLIPIDEMIA 02/06/2008  . LACUNAR INFARCTION 02/06/2008  . COLONIC POLYPS 10/13/2007  . Migraine without aura 09/27/2007  . Esophageal reflux 09/27/2007  . HERNIATED DISC 09/27/2007   Starr Lake PT, DPT, LAT, ATC  09/18/20  12:41 PM      [facility] [address] [place], [place], [address] Phone: [phone]   Fax:  [phone]  Name: [name] [name] MRN: [id number] Date of Birth: 06/02/1936

## 2020-09-21 DIAGNOSIS — R079 Chest pain, unspecified: Secondary | ICD-10-CM | POA: Diagnosis not present

## 2020-09-21 DIAGNOSIS — R296 Repeated falls: Secondary | ICD-10-CM | POA: Diagnosis not present

## 2020-09-25 ENCOUNTER — Other Ambulatory Visit: Payer: Self-pay

## 2020-09-25 ENCOUNTER — Ambulatory Visit: Payer: Medicare HMO | Admitting: Physical Therapy

## 2020-09-25 ENCOUNTER — Encounter: Payer: Self-pay | Admitting: Physical Therapy

## 2020-09-25 DIAGNOSIS — M6281 Muscle weakness (generalized): Secondary | ICD-10-CM

## 2020-09-25 DIAGNOSIS — R2689 Other abnormalities of gait and mobility: Secondary | ICD-10-CM | POA: Diagnosis not present

## 2020-09-25 DIAGNOSIS — R296 Repeated falls: Secondary | ICD-10-CM

## 2020-09-25 DIAGNOSIS — R2681 Unsteadiness on feet: Secondary | ICD-10-CM

## 2020-09-25 NOTE — Therapy (Addendum)
[place], [place], [address] Phone: [phone]   Fax:  [phone]  Physical Therapy Treatment / PROGRESS NOTE  Patient Details  Name: [name] [name] MRN: [id number] Date of Birth: August 13, 1936 Referring Provider (PT): Marton Redwood, MD   Progress Note Reporting Period  07/20/2020 to 09/25/2020  See note below for Objective Data and Assessment of Progress/Goals.      Encounter Date: 09/25/2020   PT End of Session - 09/25/20 1156    Visit Number 10    Number of Visits 17    Date for PT Re-Evaluation 10/31/20    Authorization Type [employer] Medicare: kx mod at 15th visit    Authorization - Visit Number 10    Authorization - Number of Visits 17    Progress Note Due on Visit 17    PT Start Time 1100    PT Stop Time 1145    PT Time Calculation (min) 45 min    Equipment Utilized During Treatment Gait belt    Activity Tolerance Patient tolerated treatment well    Behavior During Therapy WFL for tasks assessed/performed           Past Medical History:  Diagnosis Date  . Acid reflux disease   . Anemia    history of  . Arthritis    both hands  . Blood transfusion without reported diagnosis   . Cataract    bilateral cateracts removed  . Common migraine   . Diverticulosis    patient denies  . DJD (degenerative joint disease)   . Esophageal stricture   . Herniated disc   . Hiatal hernia 2009   EGD-Small   . History of colonic polyps   . Hyperlipidemia   . Lacunar infarction ([place])   . Macular degeneration   . Macular degeneration 2016   per husband  . Osteoporosis   . Sleep apnea    has c-pap but does not wear  . Spinal stress fracture 2019   per husband 09/29/2018    Past Surgical History:  Procedure Laterality Date  . CHOLECYSTECTOMY     4-5 YRS AGO.  Marland Kitchen COLONOSCOPY    . ESOPHAGEAL DILATION    . EYE SURGERY     BILATERAL CATARAT  . INGUINAL HERNIA REPAIR     rt.  Marland Kitchen RECTOCELE  REPAIR N/A 02/13/2013   Procedure: POSTERIOR REPAIR (RECTOCELE);  Surgeon: Emily Filbert, MD;  Location: [facility];  Service: Gynecology;  Laterality: N/A;  . ROBOTIC ASSISTED LAPAROSCOPIC LYSIS OF ADHESION  02/13/2016   Procedure: ROBOTIC ASSISTED LAPAROSCOPIC LYSIS OF ADHESION;  Surgeon: Princess Bruins, MD;  Location: [facility];  Service: Gynecology;;  . ROBOTIC ASSISTED LAPAROSCOPIC SACROCOLPOPEXY N/A 02/13/2016   Procedure: ROBOTIC ASSISTED LAPAROSCOPIC SACROCOLPOPEXY With Theola Sequin;  Surgeon: Princess Bruins, MD;  Location: [facility];  Service: Gynecology;  Laterality: N/A;  . TOTAL ABDOMINAL HYSTERECTOMY    . UPPER GASTROINTESTINAL ENDOSCOPY      There were no vitals filed for this visit.   Subjective Assessment - 09/25/20 1153    Subjective My cut on my head is healing well and I haven't had another fall; I feel unsteady when I stand up and I'm pushing my knees back into the chair    Pertinent History Macular degeneration, hx of cataracts (B extracted), Chronic daily HA, OSA on CPAP (but pt reports she does not use CPAP because it falls off and she doesn't like it), dysphagia, DJD, spondylisthesis of L3 on L4  and L4 on L5, HLD    Limitations Walking;House hold activities    How long can you sit comfortably? unlimited    How long can you stand comfortably? 10 - 20 min    How long can you walk comfortably? 10 - 20 min    Patient Stated Goals to  improve get the rid of the Rollering walk,    Currently in Pain? No/denies    Pain Score 0-No pain    Pain Location Hip    Pain Type Chronic pain    Pain Onset More than a month ago    Pain Frequency Intermittent    Aggravating Factors  Standing, walking    Pain Relieving Factors laying down, sitting              [facility] PT Assessment - 09/25/20 0001      Strength   Right Hip Flexion 3+/5    Right Hip ABduction 4/5    Right Hip ADduction 4/5    Left Hip Flexion 3+/5    Left Hip ABduction 4/5    Left Hip ADduction 4/5    Right Knee  Flexion 4/5    Right Knee Extension 4/5    Left Knee Flexion 4/5    Left Knee Extension 4/5      Berg Balance Test   Sit to Stand Able to stand  independently using hands    Standing Unsupported Able to stand 2 minutes with supervision    Sitting with Back Unsupported but Feet Supported on Floor or Stool Able to sit safely and securely 2 minutes    Stand to Sit Uses backs of legs against chair to control descent    Transfers Able to transfer with verbal cueing and /or supervision    Standing Unsupported with Eyes Closed Able to stand 10 seconds with supervision    Standing Unsupported with Feet Together Able to place feet together independently and stand for 1 minute with supervision    From Standing, Reach Forward with Outstretched Arm Can reach forward >12 cm safely (5")    From Standing Position, Pick up Object from Floor Able to pick up shoe, needs supervision    From Standing Position, Turn to Look Behind Over each Shoulder Looks behind one side only/other side shows less weight shift    Turn 360 Degrees Needs close supervision or verbal cueing    Standing Unsupported, Alternately Place Feet on Step/Stool Able to complete 4 steps without aid or supervision    Standing Unsupported, One Foot in Front Able to plae foot ahead of the other independently and hold 30 seconds    Standing on One Leg Able to lift leg independently and hold equal to or more than 3 seconds    Total Score 37      Timed Up and Go Test   Normal TUG (seconds) 27    TUG Comments continues to require CGA especially when transfering from sit to stand                         [facility] Adult PT Treatment/Exercise - 09/25/20 0001      Transfers   Comments 5x sit to stand test: 34 seconds with use of hands and mod cuing and contact gaurd.    Past test patient only able to stand 2x.                  PT Education - 09/25/20 1154  Education Details Education on importance of sit to stand without  use of back of knees for postural assist,    Person(s) Educated Patient;Spouse    Methods Explanation;Demonstration    Comprehension Verbalized understanding;Need further instruction            PT Short Term Goals - 09/25/20 1208      PT SHORT TERM GOAL #1   Title pt to be IND with initial HEp    Period Weeks    Status Achieved      PT SHORT TERM GOAL #2   Title per pt and husband to reduce falling to </=4 in 1 week time frame for therapuetic progression    Period Weeks    Status Achieved      PT SHORT TERM GOAL #3   Title improve Berg balance to >/ 32/56 to promote safety/ stability    Period Weeks    Status Achieved   Scored 37/56 on re-assessment 09/24/2020            PT Long Term Goals - 08/07/20 1415      PT LONG TERM GOAL #6   Title improve Tug time to </= 18 seconds with RW to maximize function and safety    Baseline -    Status New                 Plan - 09/25/20 1158    Clinical Impression Statement Pt reports feeling better and no falls since the previous visit; forehead laceration was examined and noted to be healing well. Pt's balance was reassessed via the BERG which progressed from 27 to a 37 indicating improvement in dynamic sitting and standing balance but that the pt is still at high risk of falling; five times sit to stand was assessed and pt required use of hands and for 27s which indicates need for further lower quarter strengthening and risk of falling; pt's TUG was also assessed at 27s and is a third indicator of fall risk. Pt's history of falls in the past 6 months also are indicated. Pt has increased LE strength scores of hip ABduction and ADduction and knee flexion and extension bilaterally. The increased strength and BERG scores are indicative of positive progress for the patient, but the current levels of the aforementioned as well as the results from the five times sit to stand and TUG assessment suggest that the patient will continue to  benefit from therapy to address balance and strength impairments to reduce fall risk and increase functional independence with ADLs.    Personal Factors and Comorbidities Comorbidity 3+;Age;Past/Current Experience    Comorbidities hx of subdural hematoma, osteopenia/ porosis,anemia, hx of falls    Examination-Activity Limitations Lift;Locomotion Level;Reach Overhead;Stand;Squat;Stairs    Stability/Clinical Decision Making Unstable/Unpredictable    Clinical Decision Making Moderate    Rehab Potential Good    PT Frequency 2x / week    PT Duration 6 weeks    PT Treatment/Interventions ADLs/Self Care Home Management;Functional mobility training;Gait training;Stair training;Patient/family education;Orthotic Fit/Training;DME Instruction;Neuromuscular re-education;Balance training;Therapeutic exercise;Therapeutic activities;Manual techniques;Vestibular;Passive range of motion;Taping;Cryotherapy;Moist Heat    PT Next Visit Plan educate husband on guarding; dynamic sitting/ standing balance balance, BLE coordination activities, progress step up height.    PT [place] set, seated clamshell, LAQ, seated marching, bridge, seated heel raise, sidelying clamshell, SLR and seated trunk rocking    Consulted and Agree with Plan of Care Patient;Family member/caregiver    Family Member Consulted Jim: husband  Patient will benefit from skilled therapeutic intervention in order to improve the following deficits and impairments:  Abnormal gait, Decreased balance, Decreased mobility, Decreased endurance, Decreased knowledge of use of DME, Decreased range of motion, Decreased strength, Increased muscle spasms, Improper body mechanics, Postural dysfunction, Pain  Visit Diagnosis: Repeated falls  Other abnormalities of gait and mobility  Muscle weakness (generalized)  Unsteadiness on feet     Problem List Patient Active Problem List   Diagnosis Date Noted  . Advanced  nonexudative age-related macular degeneration of both eyes with subfoveal involvement 07/01/2020  . Degenerative retinal drusen of both eyes 07/01/2020  . Pseudophakia 07/01/2020  . Postoperative state 02/13/2016  . Chest pain with moderate risk of acute coronary syndrome 10/08/2014  . Sepsis syndrome 06/26/2013  . Acute pyelonephritis 06/26/2013  . Migraine 03/28/2013  . Rectocele 02/13/2013  . Sleep apnea 02/16/2012  . Chronic daily headache 01/12/2012  . OSTEOPENIA 10/17/2008  . ESOPHAGEAL STRICTURE 05/02/2008  . DIVERTICULOSIS-COLON 05/02/2008  . DIVERTICULITIS-COLON 05/02/2008  . ABDOMINAL PAIN-EPIGASTRIC 05/02/2008  . GASTRITIS, ACUTE 03/27/2008  . LEG CRAMPS, NOCTURNAL 02/10/2008  . Headache(784.0) 02/10/2008  . OTHER DYSPHAGIA 02/10/2008  . HYPERLIPIDEMIA 02/06/2008  . LACUNAR INFARCTION 02/06/2008  . COLONIC POLYPS 10/13/2007  . Migraine without aura 09/27/2007  . Esophageal reflux 09/27/2007  . HERNIATED DISC 09/27/2007    Dawayne Cirri, SPT  09/25/2020, 12:13 PM   Burnis Medin PT DPT  09/25/2020  During this treatment session, the therapist was present, participating in and directing the treatment.   [place], [place], [address] Phone: [phone]   Fax:  [phone]  Name: ELVERNA CAFFEE MRN: [id number] Date of Birth: Nov 11, 1936

## 2020-10-16 ENCOUNTER — Encounter: Payer: Self-pay | Admitting: Physical Therapy

## 2020-10-16 ENCOUNTER — Ambulatory Visit: Payer: Medicare HMO | Attending: Internal Medicine | Admitting: Physical Therapy

## 2020-10-16 ENCOUNTER — Other Ambulatory Visit: Payer: Self-pay

## 2020-10-16 DIAGNOSIS — R2689 Other abnormalities of gait and mobility: Secondary | ICD-10-CM | POA: Diagnosis not present

## 2020-10-16 DIAGNOSIS — M6281 Muscle weakness (generalized): Secondary | ICD-10-CM | POA: Insufficient documentation

## 2020-10-16 DIAGNOSIS — R296 Repeated falls: Secondary | ICD-10-CM | POA: Diagnosis not present

## 2020-10-16 DIAGNOSIS — R2681 Unsteadiness on feet: Secondary | ICD-10-CM | POA: Insufficient documentation

## 2020-10-16 NOTE — Therapy (Signed)
[place], [place], [address] Phone: [phone]   Fax:  [phone]  Physical Therapy Treatment  Patient Details  Name: [name] [name] MRN: [id number] Date of Birth: 03-18-1936 Referring Provider (PT): Marton Redwood, MD    Encounter Date: 10/16/2020   PT End of Session - 10/16/20 1145    Visit Number 11    Number of Visits 17    Date for PT Re-Evaluation 10/31/20    Authorization Type [employer] Medicare: kx mod at 15th visit    Authorization Time Period 07/30/2020 - 09/24/2020    Authorization - Visit Number 10    Authorization - Number of Visits 17    Progress Note Due on Visit 17    PT Start Time 1145    PT Stop Time 1230    PT Time Calculation (min) 45 min    Activity Tolerance Patient tolerated treatment well    Behavior During Therapy [facility] for tasks assessed/performed           Past Medical History:  Diagnosis Date  . Acid reflux disease   . Anemia    history of  . Arthritis    both hands  . Blood transfusion without reported diagnosis   . Cataract    bilateral cateracts removed  . Common migraine   . Diverticulosis    patient denies  . DJD (degenerative joint disease)   . Esophageal stricture   . Herniated disc   . Hiatal hernia 2009   EGD-Small   . History of colonic polyps   . Hyperlipidemia   . Lacunar infarction ([place])   . Macular degeneration   . Macular degeneration 2016   per husband  . Osteoporosis   . Sleep apnea    has c-pap but does not wear  . Spinal stress fracture 2019   per husband 09/29/2018    Past Surgical History:  Procedure Laterality Date  . CHOLECYSTECTOMY     4-5 YRS AGO.  Marland Kitchen COLONOSCOPY    . ESOPHAGEAL DILATION    . EYE SURGERY     BILATERAL CATARAT  . INGUINAL HERNIA REPAIR     rt.  Marland Kitchen RECTOCELE REPAIR N/A 02/13/2013   Procedure: POSTERIOR REPAIR (RECTOCELE);  Surgeon: Emily Filbert, MD;  Location: [facility];  Service: Gynecology;  Laterality: N/A;   . ROBOTIC ASSISTED LAPAROSCOPIC LYSIS OF ADHESION  02/13/2016   Procedure: ROBOTIC ASSISTED LAPAROSCOPIC LYSIS OF ADHESION;  Surgeon: Princess Bruins, MD;  Location: [facility];  Service: Gynecology;;  . ROBOTIC ASSISTED LAPAROSCOPIC SACROCOLPOPEXY N/A 02/13/2016   Procedure: ROBOTIC ASSISTED LAPAROSCOPIC SACROCOLPOPEXY With Theola Sequin;  Surgeon: Princess Bruins, MD;  Location: [facility];  Service: Gynecology;  Laterality: N/A;  . TOTAL ABDOMINAL HYSTERECTOMY    . UPPER GASTROINTESTINAL ENDOSCOPY      There were no vitals filed for this visit.   Subjective Assessment - 10/16/20 1145    Subjective "I did have 2 falls since the last time I was seen here. I've been working on strengthening at [employer]."    Currently in Pain? No/denies              [facility] PT Assessment - 10/16/20 0001      Assessment   Medical Diagnosis Repeated falls R29.6    Referring Provider (PT) Marton Redwood, MD                          [facility] Adult PT Treatment/Exercise -  10/16/20 0001      Self-Care   Other Self-Care Comments  using theraband tied around walker and belt to prevent the walker from being pushing too far      Therapeutic Activites    Therapeutic Activities Other Therapeutic Activities    Other Therapeutic Activities standing walking with alternating L/R turns cues to keep lead with feet before turning walking (step, step turn)       Knee/Hip Exercises: Aerobic   Nustep L6 x 7 min LE only   working on endurance     Knee/Hip Exercises: Standing   Heel Raises 2 sets;20 reps    Hip Abduction 2 sets;20 reps;Knee straight   with red band   Forward Step Up Step Height: 8"    Functional Squat 2 sets;15 reps    Other Standing Knee Exercises alternating foot taps on 8 inch step in // 2 x 15               Balance Exercises - 10/16/20 0001      Balance Exercises: Standing   Standing Eyes Closed Narrow base of support (BOS);Foam/compliant surface;4 reps;30 secs                PT Short Term Goals - 09/25/20 1208      PT SHORT TERM GOAL #1   Title pt to be IND with initial HEp    Period Weeks    Status Achieved      PT SHORT TERM GOAL #2   Title per pt and husband to reduce falling to </=4 in 1 week time frame for therapuetic progression    Period Weeks    Status Achieved      PT SHORT TERM GOAL #3   Title improve Berg balance to >/ 32/56 to promote safety/ stability    Period Weeks    Status Achieved   Scored 37/56 on re-assessment 09/24/2020            PT Long Term Goals - 09/25/20 1530      PT LONG TERM GOAL #1   Title improve gross LE strength to >/=4+/5 to maximize hip/ knee stability with standing/ walking    Baseline progressing    Time 6    Period Weeks    Status On-going    Target Date 11/06/20      PT LONG TERM GOAL #2   Title improve berg balance to >/= 45/56 to demo improvement in balance/ stabililty    Baseline 37.56    Time 6    Period Weeks    Status On-going      PT LONG TERM GOAL #3   Title pt to be able to stand with LRAD for >/=30 min for in hom and community amb    Time 6    Period Weeks    Status On-going      PT LONG TERM GOAL #4   Title increase FOTO score to </=53% limited to demo improvement in function    Baseline not assessed today    Time 6    Period Weeks    Status On-going    Target Date 11/06/20      PT LONG TERM GOAL #5   Title pt will be IND in all HEP to maintain and progress current LOF IND    Baseline progressing wiht her husband at home    Time 6    Period Weeks    Status On-going    Target Date 11/06/20  Plan - 10/16/20 1238    Clinical Impression Statement pt reports 2 falls since the last session, noting it is from getting her feet caught up when she turns to the L/R. continued working on hip/ knee strengthening with increaesd reps to promote endurance. Continued working on balance on airex pad which she did well. practiced gait with turning taking 2 steps  before turning the RW, tied theraband to walker and her gait belt to prevent from pushing walker too far away. she did very well with exercise today.    PT Treatment/Interventions ADLs/Self Care Home Management;Functional mobility training;Gait training;Stair training;Patient/family education;Orthotic Fit/Training;DME Instruction;Neuromuscular re-education;Balance training;Therapeutic exercise;Therapeutic activities;Manual techniques;Vestibular;Passive range of motion;Taping;Cryotherapy;Moist Heat    PT Next Visit Plan educate husband on guarding; dynamic sitting/ standing balance balance, BLE coordination activities, progress step up height.    PT [place] set, seated clamshell, LAQ, seated marching, bridge, seated heel raise, sidelying clamshell, SLR and seated trunk rocking    Consulted and Agree with Plan of Care Patient           Patient will benefit from skilled therapeutic intervention in order to improve the following deficits and impairments:  Abnormal gait, Decreased balance, Decreased mobility, Decreased endurance, Decreased knowledge of use of DME, Decreased range of motion, Decreased strength, Increased muscle spasms, Improper body mechanics, Postural dysfunction, Pain  Visit Diagnosis: Repeated falls  Other abnormalities of gait and mobility  Muscle weakness (generalized)  Unsteadiness on feet     Problem List Patient Active Problem List   Diagnosis Date Noted  . Advanced nonexudative age-related macular degeneration of both eyes with subfoveal involvement 07/01/2020  . Degenerative retinal drusen of both eyes 07/01/2020  . Pseudophakia 07/01/2020  . Postoperative state 02/13/2016  . Chest pain with moderate risk of acute coronary syndrome 10/08/2014  . Sepsis syndrome 06/26/2013  . Acute pyelonephritis 06/26/2013  . Migraine 03/28/2013  . Rectocele 02/13/2013  . Sleep apnea 02/16/2012  . Chronic daily headache 01/12/2012  . OSTEOPENIA  10/17/2008  . ESOPHAGEAL STRICTURE 05/02/2008  . DIVERTICULOSIS-COLON 05/02/2008  . DIVERTICULITIS-COLON 05/02/2008  . ABDOMINAL PAIN-EPIGASTRIC 05/02/2008  . GASTRITIS, ACUTE 03/27/2008  . LEG CRAMPS, NOCTURNAL 02/10/2008  . Headache(784.0) 02/10/2008  . OTHER DYSPHAGIA 02/10/2008  . HYPERLIPIDEMIA 02/06/2008  . LACUNAR INFARCTION 02/06/2008  . COLONIC POLYPS 10/13/2007  . Migraine without aura 09/27/2007  . Esophageal reflux 09/27/2007  . HERNIATED DISC 09/27/2007    Starr Lake PT, DPT, LAT, ATC  10/16/20  12:41 PM      [facility] [address] [place], [place], [address] Phone: [phone]   Fax:  [phone]  Name: [name] [name] MRN: [id number] Date of Birth: 09/05/1936

## 2020-10-22 DIAGNOSIS — R296 Repeated falls: Secondary | ICD-10-CM | POA: Diagnosis not present

## 2020-10-22 DIAGNOSIS — R079 Chest pain, unspecified: Secondary | ICD-10-CM | POA: Diagnosis not present

## 2020-10-30 ENCOUNTER — Other Ambulatory Visit: Payer: Self-pay

## 2020-10-30 ENCOUNTER — Encounter: Payer: Self-pay | Admitting: Physical Therapy

## 2020-10-30 ENCOUNTER — Ambulatory Visit: Payer: Medicare HMO | Attending: Internal Medicine | Admitting: Physical Therapy

## 2020-10-30 DIAGNOSIS — R296 Repeated falls: Secondary | ICD-10-CM | POA: Diagnosis not present

## 2020-10-30 DIAGNOSIS — M6281 Muscle weakness (generalized): Secondary | ICD-10-CM | POA: Insufficient documentation

## 2020-10-30 DIAGNOSIS — R2689 Other abnormalities of gait and mobility: Secondary | ICD-10-CM

## 2020-10-30 DIAGNOSIS — R2681 Unsteadiness on feet: Secondary | ICD-10-CM | POA: Insufficient documentation

## 2020-10-30 NOTE — Therapy (Signed)
[place], [place], [address] Phone: [phone]   Fax:  [phone]  Physical Therapy Treatment / Re-evaluation  Patient Details  Name: [name] [name] MRN: [id number] Date of Birth: [birth year] Referring Provider (PT): Marton Redwood, MD    Encounter Date: 10/30/2020   PT End of Session - 10/30/20 1245    Visit Number 12    Number of Visits 17    Date for PT Re-Evaluation 12/04/20    Authorization Type [employer] Medicare: kx mod at 15th visit    Authorization Time Period 07/30/2020 - 09/24/2020    Authorization - Visit Number 10    Authorization - Number of Visits 17    Progress Note Due on Visit 17    PT Start Time 1145    PT Stop Time 1241    PT Time Calculation (min) 56 min    Activity Tolerance Patient tolerated treatment well    Behavior During Therapy [facility] for tasks assessed/performed           Past Medical History:  Diagnosis Date  . Acid reflux disease   . Anemia    history of  . Arthritis    both hands  . Blood transfusion without reported diagnosis   . Cataract    bilateral cateracts removed  . Common migraine   . Diverticulosis    patient denies  . DJD (degenerative joint disease)   . Esophageal stricture   . Herniated disc   . Hiatal hernia 2009   EGD-Small   . History of colonic polyps   . Hyperlipidemia   . Lacunar infarction ([place])   . Macular degeneration   . Macular degeneration 2016   per husband  . Osteoporosis   . Sleep apnea    has c-pap but does not wear  . Spinal stress fracture 2019   per husband 09/29/2018    Past Surgical History:  Procedure Laterality Date  . CHOLECYSTECTOMY     4-5 YRS AGO.  Marland Kitchen COLONOSCOPY    . ESOPHAGEAL DILATION    . EYE SURGERY     BILATERAL CATARAT  . INGUINAL HERNIA REPAIR     rt.  Marland Kitchen RECTOCELE REPAIR N/A 02/13/2013   Procedure: POSTERIOR REPAIR (RECTOCELE);  Surgeon: Emily Filbert, MD;  Location: [facility];  Service: Gynecology;   Laterality: N/A;  . ROBOTIC ASSISTED LAPAROSCOPIC LYSIS OF ADHESION  02/13/2016   Procedure: ROBOTIC ASSISTED LAPAROSCOPIC LYSIS OF ADHESION;  Surgeon: Princess Bruins, MD;  Location: [facility];  Service: Gynecology;;  . ROBOTIC ASSISTED LAPAROSCOPIC SACROCOLPOPEXY N/A 02/13/2016   Procedure: ROBOTIC ASSISTED LAPAROSCOPIC SACROCOLPOPEXY With Theola Sequin;  Surgeon: Princess Bruins, MD;  Location: [facility];  Service: Gynecology;  Laterality: N/A;  . TOTAL ABDOMINAL HYSTERECTOMY    . UPPER GASTROINTESTINAL ENDOSCOPY      There were no vitals filed for this visit.   Subjective Assessment - 10/30/20 1153    Subjective "I fell this past week because we have a new mattress that is taller and as I was getting out of bed I slid out of it and had to get 8 stiches"    Currently in Pain? No/denies              [facility] PT Assessment - 10/30/20 0001      Standardized Balance Assessment   Standardized Balance Assessment Berg Balance Test;Five Times Sit to Stand    Five times sit to stand comments  29s with UE usage; VC to move  feet back under thights      Berg Balance Test   Sit to Stand Able to stand  independently using hands    Standing Unsupported Able to stand 2 minutes with supervision    Sitting with Back Unsupported but Feet Supported on Floor or Stool Able to sit safely and securely 2 minutes    Stand to Sit Controls descent by using hands    Transfers Able to transfer with verbal cueing and /or supervision    Standing Unsupported with Eyes Closed Able to stand 10 seconds safely    Standing Unsupported with Feet Together Able to place feet together independently and stand 1 minute safely    From Standing, Reach Forward with Outstretched Arm Can reach confidently >25 cm (10")   Able to reach 10" on the left, 9.5 on the right   From Standing Position, Pick up Object from Floor Able to pick up shoe, needs supervision    From Standing Position, Turn to Look Behind Over each Shoulder Looks  behind one side only/other side shows less weight shift    Turn 360 Degrees Needs close supervision or verbal cueing   Left 29s, right 29s   Standing Unsupported, Alternately Place Feet on Step/Stool Able to complete >2 steps/needs minimal assist    Standing Unsupported, One Foot in Front Able to take small step independently and hold 30 seconds    Standing on One Leg Able to lift leg independently and hold > 10 seconds    Total Score 41      Timed Up and Go Test   Normal TUG (seconds) 43    TUG Comments CGA, shuffling pivot around cone and upon return to chair                         [facility] Adult PT Treatment/Exercise - 10/30/20 0001      Transfers   Transfers Stand Pivot Transfers    Stand Pivot Transfers 5: Supervision    Stand Pivot Transfer Details (indicate cue type and reason) with standard walking, taking small steps to ensure safety and keeping walker in front (don't throw it away)    Comments Maximal verbal cuing                  PT Education - 10/30/20 1155    Education Details Transitions from supine to sit to standing due to recent fall; pivot transfer, rollator walker    Person(s) Educated Patient    Methods Explanation;Demonstration    Comprehension Verbalized understanding            PT Short Term Goals - 09/25/20 1208      PT SHORT TERM GOAL #1   Title pt to be IND with initial HEp    Period Weeks    Status Achieved      PT SHORT TERM GOAL #2   Title per pt and husband to reduce falling to </=4 in 1 week time frame for therapuetic progression    Period Weeks    Status Achieved      PT SHORT TERM GOAL #3   Title improve Berg balance to >/ 32/56 to promote safety/ stability    Period Weeks    Status Achieved   Scored 37/56 on re-assessment 09/24/2020            PT Long Term Goals - 10/30/20 1248      PT LONG TERM GOAL #1   Title improve gross  LE strength to >/=4+/5 to maximize hip/ knee stability with standing/ walking     Baseline progressing    Time 6    Period Weeks    Status On-going    Target Date 12/04/20      PT LONG TERM GOAL #2   Title improve berg balance to >/= 45/56 to demo improvement in balance/ stabililty    Baseline 41    Time 6    Period Weeks    Status On-going    Target Date 11/27/20      PT LONG TERM GOAL #3   Title pt to be able to stand with LRAD for >/=30 min for in hom and community amb    Time 6    Period Weeks    Status On-going    Target Date 12/04/20      PT LONG TERM GOAL #4   Title increase FOTO score to </=53% limited to demo improvement in function    Baseline not assessed today    Time 6    Period Weeks    Status On-going    Target Date 12/04/20      PT LONG TERM GOAL #5   Title pt will be IND in all HEP to maintain and progress current LOF IND    Baseline progressing wiht her husband at home    Time 6    Period Weeks    Status On-going    Target Date 12/04/20                 Plan - 10/30/20 1208    Clinical Impression Statement Pt reports a fall since the previous visit while exiting the bed and hitting her head, requiring stiches. Pt was re-evaluated with 5xSTS, TUG, and BERG and Pt continues to be a fall risk. Education was provided for bed transfers and pivoting safely within the walker and patient required maximal verbal cuing, at which time they are able to perform the movements well. Pt would benefit from continued therapy to address strength impairments, gait and transfer training for safety, and reducing fall risk.    PT Frequency 2x / week    PT Duration 4 weeks    PT Treatment/Interventions ADLs/Self Care Home Management;Functional mobility training;Gait training;Stair training;Patient/family education;Orthotic Fit/Training;DME Instruction;Neuromuscular re-education;Balance training;Therapeutic exercise;Therapeutic activities;Manual techniques;Vestibular;Passive range of motion;Taping;Cryotherapy;Moist Heat    PT Next Visit Plan Evaluate  rollator useage; dynamic sitting/ standing balance balance, BLE coordination activities, progress step up height.    PT [place] set, seated clamshell, LAQ, seated marching, bridge, seated heel raise, sidelying clamshell, SLR and seated trunk rocking    Consulted and Agree with Plan of Care Patient           Patient will benefit from skilled therapeutic intervention in order to improve the following deficits and impairments:  Abnormal gait, Decreased balance, Decreased mobility, Decreased endurance, Decreased knowledge of use of DME, Decreased range of motion, Decreased strength, Increased muscle spasms, Improper body mechanics, Postural dysfunction, Pain  Visit Diagnosis: Repeated falls  Other abnormalities of gait and mobility  Muscle weakness (generalized)  Unsteadiness on feet     Problem List Patient Active Problem List   Diagnosis Date Noted  . Advanced nonexudative age-related macular degeneration of both eyes with subfoveal involvement 07/01/2020  . Degenerative retinal drusen of both eyes 07/01/2020  . Pseudophakia 07/01/2020  . Postoperative state 02/13/2016  . Chest pain with moderate risk of acute coronary syndrome 10/08/2014  . Sepsis syndrome  06/26/2013  . Acute pyelonephritis 06/26/2013  . Migraine 03/28/2013  . Rectocele 02/13/2013  . Sleep apnea 02/16/2012  . Chronic daily headache 01/12/2012  . OSTEOPENIA 10/17/2008  . ESOPHAGEAL STRICTURE 05/02/2008  . DIVERTICULOSIS-COLON 05/02/2008  . DIVERTICULITIS-COLON 05/02/2008  . ABDOMINAL PAIN-EPIGASTRIC 05/02/2008  . GASTRITIS, ACUTE 03/27/2008  . LEG CRAMPS, NOCTURNAL 02/10/2008  . Headache(784.0) 02/10/2008  . OTHER DYSPHAGIA 02/10/2008  . HYPERLIPIDEMIA 02/06/2008  . LACUNAR INFARCTION 02/06/2008  . COLONIC POLYPS 10/13/2007  . Migraine without aura 09/27/2007  . Esophageal reflux 09/27/2007  . HERNIATED DISC 09/27/2007    Dawayne Cirri 10/30/2020, 12:52 PM  [facility] [address] [place], [place], [address] Phone: [phone]   Fax:  [phone]  Name: RUBEE VEGA MRN: [id number] Date of Birth: Apr 19, 1936    Referring diagnosis? R29.6 Treatment diagnosis? (if different than referring diagnosis) R29.6, R26.89, M62.81, R26.81 What was this (referring dx) caused by? []  Surgery [x]  Fall []  Ongoing issue []  Arthritis []  Other: ____________  Laterality: []  Rt []  Lt []  Both  Check all possible CPT codes:      [x]  97110 (Therapeutic Exercise)  []  92507 (SLP Treatment)  [x]  97112 (Neuro Re-ed)   []  92526 (Swallowing Treatment)   [x]  52080 (Gait Training)   []  [id number] (Cognitive Training, 1st 15 minutes) [x]  97140 (Manual Therapy)   []  97130 (Cognitive Training, each add'l 15 minutes)  [x]  97530 (Therapeutic Activities)  []  Other, List CPT Code ____________    [x]  22336 (Self Care)       [x]  All codes above (97110 - 97535)  []  97012 (Mechanical Traction)  []  97014 (E-stim Unattended)  []  97032 (E-stim manual)  []  97033 (Ionto)  []  97035 (Ultrasound)  []  97760 (Orthotic Fit) [x]  [id number] (Physical Performance Training) []  [id number] (Aquatic Therapy) []  97034 (Contrast Bath) []  [id number] (Paraffin) []  97597 (Wound Care 1st 20 sq cm) []  97598 (Wound Care each add'l 20 sq cm)

## 2020-10-31 DIAGNOSIS — M810 Age-related osteoporosis without current pathological fracture: Secondary | ICD-10-CM | POA: Diagnosis not present

## 2020-10-31 DIAGNOSIS — Z79899 Other long term (current) drug therapy: Secondary | ICD-10-CM | POA: Diagnosis not present

## 2020-11-06 ENCOUNTER — Other Ambulatory Visit: Payer: Self-pay

## 2020-11-06 ENCOUNTER — Encounter: Payer: Self-pay | Admitting: Physical Therapy

## 2020-11-06 ENCOUNTER — Ambulatory Visit: Payer: Medicare HMO | Admitting: Physical Therapy

## 2020-11-06 DIAGNOSIS — R2681 Unsteadiness on feet: Secondary | ICD-10-CM | POA: Diagnosis not present

## 2020-11-06 DIAGNOSIS — R2689 Other abnormalities of gait and mobility: Secondary | ICD-10-CM

## 2020-11-06 DIAGNOSIS — R296 Repeated falls: Secondary | ICD-10-CM

## 2020-11-06 DIAGNOSIS — M6281 Muscle weakness (generalized): Secondary | ICD-10-CM

## 2020-11-06 NOTE — Therapy (Signed)
[place], [place], [address] Phone: [phone]   Fax:  [phone]  Physical Therapy Treatment  Patient Details  Name: [name] [name] MRN: [id number] Date of Birth: 1936/01/13 Referring Provider (PT): Marton Redwood, MD    Encounter Date: 11/06/2020   PT End of Session - 11/06/20 1152    Visit Number 13    Number of Visits 17    Date for PT Re-Evaluation 12/04/20    Authorization Type [employer] Medicare: kx mod at 15th visit    Authorization Time Period 07/30/2020 - 09/24/2020 resubmitted on 10/30/2020    Authorization - Visit Number 10    Authorization - Number of Visits 17    Progress Note Due on Visit 17    PT Start Time 1145    PT Stop Time 1230    PT Time Calculation (min) 45 min    Equipment Utilized During Treatment Gait belt    Activity Tolerance Patient tolerated treatment well    Behavior During Therapy WFL for tasks assessed/performed           Past Medical History:  Diagnosis Date  . Acid reflux disease   . Anemia    history of  . Arthritis    both hands  . Blood transfusion without reported diagnosis   . Cataract    bilateral cateracts removed  . Common migraine   . Diverticulosis    patient denies  . DJD (degenerative joint disease)   . Esophageal stricture   . Herniated disc   . Hiatal hernia 2009   EGD-Small   . History of colonic polyps   . Hyperlipidemia   . Lacunar infarction ([place])   . Macular degeneration   . Macular degeneration 2016   per husband  . Osteoporosis   . Sleep apnea    has c-pap but does not wear  . Spinal stress fracture 2019   per husband 09/29/2018    Past Surgical History:  Procedure Laterality Date  . CHOLECYSTECTOMY     4-5 YRS AGO.  Marland Kitchen COLONOSCOPY    . ESOPHAGEAL DILATION    . EYE SURGERY     BILATERAL CATARAT  . INGUINAL HERNIA REPAIR     rt.  Marland Kitchen RECTOCELE REPAIR N/A 02/13/2013   Procedure: POSTERIOR REPAIR (RECTOCELE);  Surgeon:  Emily Filbert, MD;  Location: [facility];  Service: Gynecology;  Laterality: N/A;  . ROBOTIC ASSISTED LAPAROSCOPIC LYSIS OF ADHESION  02/13/2016   Procedure: ROBOTIC ASSISTED LAPAROSCOPIC LYSIS OF ADHESION;  Surgeon: Princess Bruins, MD;  Location: [facility];  Service: Gynecology;;  . ROBOTIC ASSISTED LAPAROSCOPIC SACROCOLPOPEXY N/A 02/13/2016   Procedure: ROBOTIC ASSISTED LAPAROSCOPIC SACROCOLPOPEXY With Theola Sequin;  Surgeon: Princess Bruins, MD;  Location: [facility];  Service: Gynecology;  Laterality: N/A;  . TOTAL ABDOMINAL HYSTERECTOMY    . UPPER GASTROINTESTINAL ENDOSCOPY      There were no vitals filed for this visit.   Subjective Assessment - 11/06/20 1145    Subjective "No falls since the previous visit. I'm feeling okay."    Currently in Pain? No/denies    Pain Score 4    Pain in lowback when waking up and getting up   Pain Location Back    Pain Relieving Factors Resting                             [facility] Adult PT Treatment/Exercise - 11/06/20 0001  Transfers   Transfers Sit to Stand;Stand to [employer] Transfers    Sit to Stand 4: Min guard;With armrests;With upper extremity assist    Comments Maximal verbal cuing, especially for sit to stand and stand to sit; patient continues to stand up and lean backwards increasing fall risk during transfer      Knee/Hip Exercises: Aerobic   Nustep L6 x 7 min LE only               Balance Exercises - 11/06/20 0001      Balance Exercises: Standing   Other Standing Exercises Standing at high mat reaching across body to pick up objects and place into box from right to left, utilizing alternate UEs, and also utilizing both hands occasionally.. 4# weight, green theraball, red theraball, 6 cones. Instructions in weight shift and appropriate stepping.             PT Education - 11/06/20 1152    Education Details Further instruction on AD (keeping it close to trunk), pivot turns    Person(s) Educated  Patient;Spouse   Husband's name is [name]   Methods Explanation    Comprehension Verbalized understanding            PT Short Term Goals - 09/25/20 1208      PT SHORT TERM GOAL #1   Title pt to be IND with initial HEp    Period Weeks    Status Achieved      PT SHORT TERM GOAL #2   Title per pt and husband to reduce falling to </=4 in 1 week time frame for therapuetic progression    Period Weeks    Status Achieved      PT SHORT TERM GOAL #3   Title improve Berg balance to >/ 32/56 to promote safety/ stability    Period Weeks    Status Achieved   Scored 37/56 on re-assessment 09/24/2020            PT Long Term Goals - 10/30/20 1248      PT LONG TERM GOAL #1   Title improve gross LE strength to >/=4+/5 to maximize hip/ knee stability with standing/ walking    Baseline progressing    Time 6    Period Weeks    Status On-going    Target Date 12/04/20      PT LONG TERM GOAL #2   Title improve berg balance to >/= 45/56 to demo improvement in balance/ stabililty    Baseline 41    Time 6    Period Weeks    Status On-going    Target Date 11/27/20      PT LONG TERM GOAL #3   Title pt to be able to stand with LRAD for >/=30 min for in hom and community amb    Time 6    Period Weeks    Status On-going    Target Date 12/04/20      PT LONG TERM GOAL #4   Title increase FOTO score to </=53% limited to demo improvement in function    Baseline not assessed today    Time 6    Period Weeks    Status On-going    Target Date 12/04/20      PT LONG TERM GOAL #5   Title pt will be IND in all HEP to maintain and progress current LOF IND    Baseline progressing wiht her husband at home    Time 6    Period  Weeks    Status On-going    Target Date 12/04/20                 Plan - 11/06/20 1255    Clinical Impression Statement Pt reports no falls since 10/25/2020 and decided to wait on rollater type walker training. Focus of therapy today was on transfers and  perturbations outside of the base of support, as well as further cueing for sit to stand. Provided functional breakdown to both sit to stand and stand to sit exercises to patient and caregiver to help with cuing while at home.    PT Frequency 2x / week    PT Duration 4 weeks    PT Treatment/Interventions ADLs/Self Care Home Management;Functional mobility training;Gait training;Stair training;Patient/family education;Orthotic Fit/Training;DME Instruction;Neuromuscular re-education;Balance training;Therapeutic exercise;Therapeutic activities;Manual techniques;Vestibular;Passive range of motion;Taping;Cryotherapy;Moist Heat    PT Next Visit Plan Evaluate rollator useage; continue perturbations, dynamic sitting/ standing balance, BLE coordination activities, progress step up height.    PT [place] set, seated clamshell, LAQ, seated marching, bridge, seated heel raise, sidelying clamshell, SLR and seated trunk rocking    Consulted and Agree with Plan of Care Patient           Patient will benefit from skilled therapeutic intervention in order to improve the following deficits and impairments:  Abnormal gait, Decreased balance, Decreased mobility, Decreased endurance, Decreased knowledge of use of DME, Decreased range of motion, Decreased strength, Increased muscle spasms, Improper body mechanics, Postural dysfunction, Pain  Visit Diagnosis: Repeated falls  Unsteadiness on feet  Other abnormalities of gait and mobility  Muscle weakness (generalized)     Problem List Patient Active Problem List   Diagnosis Date Noted  . Advanced nonexudative age-related macular degeneration of both eyes with subfoveal involvement 07/01/2020  . Degenerative retinal drusen of both eyes 07/01/2020  . Pseudophakia 07/01/2020  . Postoperative state 02/13/2016  . Chest pain with moderate risk of acute coronary syndrome 10/08/2014  . Sepsis syndrome 06/26/2013  . Acute pyelonephritis  06/26/2013  . Migraine 03/28/2013  . Rectocele 02/13/2013  . Sleep apnea 02/16/2012  . Chronic daily headache 01/12/2012  . OSTEOPENIA 10/17/2008  . ESOPHAGEAL STRICTURE 05/02/2008  . DIVERTICULOSIS-COLON 05/02/2008  . DIVERTICULITIS-COLON 05/02/2008  . ABDOMINAL PAIN-EPIGASTRIC 05/02/2008  . GASTRITIS, ACUTE 03/27/2008  . LEG CRAMPS, NOCTURNAL 02/10/2008  . Headache(784.0) 02/10/2008  . OTHER DYSPHAGIA 02/10/2008  . HYPERLIPIDEMIA 02/06/2008  . LACUNAR INFARCTION 02/06/2008  . COLONIC POLYPS 10/13/2007  . Migraine without aura 09/27/2007  . Esophageal reflux 09/27/2007  . HERNIATED DISC 09/27/2007    Dawayne Cirri, SPT 11/06/2020, 1:07 PM  [facility] [address] [place], [place], [address] Phone: [phone]   Fax:  [phone]  Name: ALBIRTHA GRINAGE MRN: [id number] Date of Birth: Nov 21, 1936

## 2020-11-06 NOTE — Patient Instructions (Signed)
Sit to Stand Exercise: Starting position: chair with arms that is blocked from moving backwards, walker in front of chair as close as possible 1. Scoot forward in the chair until you're at the edge of the chair. 2. Slide heels backward so your knees are bent past 90 degrees. 3. Lean trunk forward (shifting weight forward). 4. Press up into hands through armrests (or chair surface) and press into legs to stand up. DO NOT USE BACK OF KNEES ON CHAIR! 5. Once you are fully upright, shift hands to walker and move forward, keeping body within walker "It's a walker, not a leader!"  Stand to Sit exercise: Starting position: chair with arms that is blocked from moving backwards 1. Pivot turn with baby steps, keeping body within walker 2. Back up until your walker touches the chair. DO NOT USE BACK OF KNEES ON CHAIR! 3. Reach backwards with your hand until you have found the arm of the chair.  4. Lean trunk forward while sending bottom backward, lowering trunk with your arms. 5. Gently sit down in chair.

## 2020-11-12 ENCOUNTER — Encounter (HOSPITAL_COMMUNITY): Payer: Medicare HMO

## 2020-11-13 ENCOUNTER — Encounter: Payer: Self-pay | Admitting: Physical Therapy

## 2020-11-13 ENCOUNTER — Ambulatory Visit: Payer: Medicare HMO | Admitting: Physical Therapy

## 2020-11-13 ENCOUNTER — Other Ambulatory Visit: Payer: Self-pay

## 2020-11-13 DIAGNOSIS — R296 Repeated falls: Secondary | ICD-10-CM

## 2020-11-13 DIAGNOSIS — R2681 Unsteadiness on feet: Secondary | ICD-10-CM

## 2020-11-13 DIAGNOSIS — M6281 Muscle weakness (generalized): Secondary | ICD-10-CM | POA: Diagnosis not present

## 2020-11-13 DIAGNOSIS — R2689 Other abnormalities of gait and mobility: Secondary | ICD-10-CM

## 2020-11-13 NOTE — Therapy (Addendum)
[place], [place], [address] Phone: [phone]   Fax:  [phone]  Physical Therapy Treatment / discharge  Patient Details  Name: [name] [name] MRN: [id number] Date of Birth: [birth year] Referring Provider (PT): Marton Redwood, MD    Encounter Date: 11/13/2020   PT End of Session - 11/13/20 1258    Visit Number 14    Number of Visits 17    Date for PT Re-Evaluation 12/04/20    Authorization Type [employer] Medicare: kx mod at 15th visit    Authorization Time Period 07/30/2020 - 09/24/2020 resubmitted on 11/08/2020 new auth pending    PT Start Time 1147    PT Stop Time 1230    PT Time Calculation (min) 43 min    Activity Tolerance Patient tolerated treatment well    Behavior During Therapy [facility] for tasks assessed/performed           Past Medical History:  Diagnosis Date  . Acid reflux disease   . Anemia    history of  . Arthritis    both hands  . Blood transfusion without reported diagnosis   . Cataract    bilateral cateracts removed  . Common migraine   . Diverticulosis    patient denies  . DJD (degenerative joint disease)   . Esophageal stricture   . Herniated disc   . Hiatal hernia 2009   EGD-Small   . History of colonic polyps   . Hyperlipidemia   . Lacunar infarction ([place])   . Macular degeneration   . Macular degeneration 2016   per husband  . Osteoporosis   . Sleep apnea    has c-pap but does not wear  . Spinal stress fracture 2019   per husband 09/29/2018    Past Surgical History:  Procedure Laterality Date  . CHOLECYSTECTOMY     4-5 YRS AGO.  Marland Kitchen COLONOSCOPY    . ESOPHAGEAL DILATION    . EYE SURGERY     BILATERAL CATARAT  . INGUINAL HERNIA REPAIR     rt.  Marland Kitchen RECTOCELE REPAIR N/A 02/13/2013   Procedure: POSTERIOR REPAIR (RECTOCELE);  Surgeon: Emily Filbert, MD;  Location: [facility];  Service: Gynecology;  Laterality: N/A;  . ROBOTIC ASSISTED LAPAROSCOPIC LYSIS OF ADHESION   02/13/2016   Procedure: ROBOTIC ASSISTED LAPAROSCOPIC LYSIS OF ADHESION;  Surgeon: Princess Bruins, MD;  Location: [facility];  Service: Gynecology;;  . ROBOTIC ASSISTED LAPAROSCOPIC SACROCOLPOPEXY N/A 02/13/2016   Procedure: ROBOTIC ASSISTED LAPAROSCOPIC SACROCOLPOPEXY With Theola Sequin;  Surgeon: Princess Bruins, MD;  Location: [facility];  Service: Gynecology;  Laterality: N/A;  . TOTAL ABDOMINAL HYSTERECTOMY    . UPPER GASTROINTESTINAL ENDOSCOPY      There were no vitals filed for this visit.   Subjective Assessment - 11/13/20 1153    Subjective " I have had no falls for over the last 3 sessions. I did do alot of walking and I was kind of sore in the back of my hips."    Patient Stated Goals to  improve get the rid of the Rollering walk,    Currently in Pain? No/denies                             [facility] Adult PT Treatment/Exercise - 11/13/20 0001      Therapeutic Activites    Other Therapeutic Activities walking 1 x 150 ft performing intermittent L/R turns with clinician cues. intermittent cues required  for proper form      Knee/Hip Exercises: Aerobic   Nustep L6 x 10 min LE only   working on endurance     Knee/Hip Exercises: Seated   Sit to [employer] 2 sets;10 reps   from chair with airex x 1 set, 1 without airex: 1-2 max cues for proper form/ technique              Balance Exercises - 11/13/20 0001      Balance Exercises: Standing   Standing Eyes Closed Narrow base of support (BOS);4 reps;Foam/compliant surface;Other (comment)   with alternating multi-directional pertubations in //   Marching Foam/compliant surface;20 reps   x 2 sets in //              PT Short Term Goals - 09/25/20 1208      PT SHORT TERM GOAL #1   Title pt to be IND with initial HEp    Period Weeks    Status Achieved      PT SHORT TERM GOAL #2   Title per pt and husband to reduce falling to </=4 in 1 week time frame for therapuetic progression    Period Weeks    Status Achieved       PT SHORT TERM GOAL #3   Title improve Berg balance to >/ 32/56 to promote safety/ stability    Period Weeks    Status Achieved   Scored 37/56 on re-assessment 09/24/2020            PT Long Term Goals - 10/30/20 1248      PT LONG TERM GOAL #1   Title improve gross LE strength to >/=4+/5 to maximize hip/ knee stability with standing/ walking    Baseline progressing    Time 6    Period Weeks    Status On-going    Target Date 12/04/20      PT LONG TERM GOAL #2   Title improve berg balance to >/= 45/56 to demo improvement in balance/ stabililty    Baseline 41    Time 6    Period Weeks    Status On-going    Target Date 11/27/20      PT LONG TERM GOAL #3   Title pt to be able to stand with LRAD for >/=30 min for in hom and community amb    Time 6    Period Weeks    Status On-going    Target Date 12/04/20      PT LONG TERM GOAL #4   Title increase FOTO score to </=53% limited to demo improvement in function    Baseline not assessed today    Time 6    Period Weeks    Status On-going    Target Date 12/04/20      PT LONG TERM GOAL #5   Title pt will be IND in all HEP to maintain and progress current LOF IND    Baseline progressing wiht her husband at home    Time 6    Period Weeks    Status On-going    Target Date 12/04/20                 Plan - 11/13/20 1259    Clinical Impression Statement Mrs Moro did very well today with her transfers requring only 1 -2 cues with sit to stand. continued working on functional mobility and hip strengthening. she did requring intermittent cues with walking and peforming turns with her RW but overall  did well. pt is making good progress plan to see her for 2-3 more visits and plan to discharge to HEP after.    PT Treatment/Interventions ADLs/Self Care Home Management;Functional mobility training;Gait training;Stair training;Patient/family education;Orthotic Fit/Training;DME Instruction;Neuromuscular re-education;Balance  training;Therapeutic exercise;Therapeutic activities;Manual techniques;Vestibular;Passive range of motion;Taping;Cryotherapy;Moist Heat    PT Next Visit Plan Evaluate rollator useage; continue perturbations, dynamic sitting/ standing balance, BLE coordination activities, progress step up height. balance with pertubations    PT Home Exercise Plan [id number] - glute set, seated clamshell, LAQ, seated marching, bridge, seated heel raise, sidelying clamshell, SLR and seated trunk rocking    Consulted and Agree with Plan of Care Patient           Patient will benefit from skilled therapeutic intervention in order to improve the following deficits and impairments:  Abnormal gait,Decreased balance,Decreased mobility,Decreased endurance,Decreased knowledge of use of DME,Decreased range of motion,Decreased strength,Increased muscle spasms,Improper body mechanics,Postural dysfunction,Pain  Visit Diagnosis: Repeated falls  Unsteadiness on feet  Other abnormalities of gait and mobility  Muscle weakness (generalized)     Problem List Patient Active Problem List   Diagnosis Date Noted  . Advanced nonexudative age-related macular degeneration of both eyes with subfoveal involvement 07/01/2020  . Degenerative retinal drusen of both eyes 07/01/2020  . Pseudophakia 07/01/2020  . Postoperative state 02/13/2016  . Chest pain with moderate risk of acute coronary syndrome 10/08/2014  . Sepsis syndrome 06/26/2013  . Acute pyelonephritis 06/26/2013  . Migraine 03/28/2013  . Rectocele 02/13/2013  . Sleep apnea 02/16/2012  . Chronic daily headache 01/12/2012  . OSTEOPENIA 10/17/2008  . ESOPHAGEAL STRICTURE 05/02/2008  . DIVERTICULOSIS-COLON 05/02/2008  . DIVERTICULITIS-COLON 05/02/2008  . ABDOMINAL PAIN-EPIGASTRIC 05/02/2008  . GASTRITIS, ACUTE 03/27/2008  . LEG CRAMPS, NOCTURNAL 02/10/2008  . Headache(784.0) 02/10/2008  . OTHER DYSPHAGIA 02/10/2008  . HYPERLIPIDEMIA 02/06/2008  . LACUNAR  INFARCTION 02/06/2008  . COLONIC POLYPS 10/13/2007  . Migraine without aura 09/27/2007  . Esophageal reflux 09/27/2007  . HERNIATED DISC 09/27/2007   Starr Lake PT, DPT, LAT, ATC  11/13/20  1:06 PM      [facility] [address] [place], [place], [address] Phone: [phone]   Fax:  [phone]  Name: AMYRE SEGUNDO MRN: [id number] Date of Birth: 25-Jan-1936     PHYSICAL THERAPY DISCHARGE SUMMARY  Visits from Start of Care: 14  Current functional level related to goals / functional outcomes: See goals   Remaining deficits: Current status unknown   Education / Equipment: HEP, theraband, posture, balance, proper RW use  Plan: Patient agrees to discharge.  Patient goals were partially met. Patient is being discharged due to not returning since the last visit.  ?????         Quantisha Marsicano PT, DPT, LAT, ATC  01/02/21  2:29 PM

## 2020-11-21 DIAGNOSIS — R296 Repeated falls: Secondary | ICD-10-CM | POA: Diagnosis not present

## 2020-11-21 DIAGNOSIS — R079 Chest pain, unspecified: Secondary | ICD-10-CM | POA: Diagnosis not present

## 2020-12-11 DIAGNOSIS — M542 Cervicalgia: Secondary | ICD-10-CM | POA: Diagnosis not present

## 2020-12-11 DIAGNOSIS — M5414 Radiculopathy, thoracic region: Secondary | ICD-10-CM | POA: Diagnosis not present

## 2020-12-11 DIAGNOSIS — R030 Elevated blood-pressure reading, without diagnosis of hypertension: Secondary | ICD-10-CM | POA: Diagnosis not present

## 2020-12-17 ENCOUNTER — Other Ambulatory Visit (HOSPITAL_COMMUNITY): Payer: Self-pay | Admitting: *Deleted

## 2020-12-22 DIAGNOSIS — R296 Repeated falls: Secondary | ICD-10-CM | POA: Diagnosis not present

## 2020-12-22 DIAGNOSIS — R079 Chest pain, unspecified: Secondary | ICD-10-CM | POA: Diagnosis not present

## 2020-12-23 ENCOUNTER — Encounter (HOSPITAL_COMMUNITY)
Admission: RE | Admit: 2020-12-23 | Discharge: 2020-12-23 | Disposition: A | Discharge status: Home / Self Care | Payer: Medicare HMO | Source: Ambulatory Visit | Attending: Internal Medicine | Admitting: Internal Medicine

## 2020-12-23 DIAGNOSIS — M810 Age-related osteoporosis without current pathological fracture: Secondary | ICD-10-CM | POA: Insufficient documentation

## 2020-12-23 MED ORDER — DENOSUMAB 60 MG/ML ~~LOC~~ SOSY
60.0000 mg | PREFILLED_SYRINGE | Freq: Once | SUBCUTANEOUS | Status: AC
  Administered 2020-12-23: 60 mg via SUBCUTANEOUS

## 2020-12-23 NOTE — Discharge Instructions (Signed)
Denosumab injection °What is this medicine? °DENOSUMAB (den oh sue mab) slows bone breakdown. Prolia is used to treat osteoporosis in women after menopause and in men, and in people who are taking corticosteroids for 6 months or more. Xgeva is used to treat a high calcium level due to cancer and to prevent bone fractures and other bone problems caused by multiple myeloma or cancer bone metastases. Xgeva is also used to treat giant cell tumor of the bone. °This medicine may be used for other purposes; ask your health care provider or pharmacist if you have questions. °COMMON BRAND NAME(S): Prolia, XGEVA °What should I tell my health care provider before I take this medicine? °They need to know if you have any of these conditions: °· dental disease °· having surgery or tooth extraction °· infection °· kidney disease °· low levels of calcium or Vitamin D in the blood °· malnutrition °· on hemodialysis °· skin conditions or sensitivity °· thyroid or parathyroid disease °· an unusual reaction to denosumab, other medicines, foods, dyes, or preservatives °· pregnant or trying to get pregnant °· breast-feeding °How should I use this medicine? °This medicine is for injection under the skin. It is given by a health care professional in a hospital or clinic setting. °A special MedGuide will be given to you before each treatment. Be sure to read this information carefully each time. °For Prolia, talk to your pediatrician regarding the use of this medicine in children. Special care may be needed. For Xgeva, talk to your pediatrician regarding the use of this medicine in children. While this drug may be prescribed for children as young as 13 years for selected conditions, precautions do apply. °Overdosage: If you think you have taken too much of this medicine contact a poison control center or emergency room at once. °NOTE: This medicine is only for you. Do not share this medicine with others. °What if I miss a dose? °It is  important not to miss your dose. Call your doctor or health care professional if you are unable to keep an appointment. °What may interact with this medicine? °Do not take this medicine with any of the following medications: °· other medicines containing denosumab °This medicine may also interact with the following medications: °· medicines that lower your chance of fighting infection °· steroid medicines like prednisone or cortisone °This list may not describe all possible interactions. Give your health care provider a list of all the medicines, herbs, non-prescription drugs, or dietary supplements you use. Also tell them if you smoke, drink alcohol, or use illegal drugs. Some items may interact with your medicine. °What should I watch for while using this medicine? °Visit your doctor or health care professional for regular checks on your progress. Your doctor or health care professional may order blood tests and other tests to see how you are doing. °Call your doctor or health care professional for advice if you get a fever, chills or sore throat, or other symptoms of a cold or flu. Do not treat yourself. This drug may decrease your body's ability to fight infection. Try to avoid being around people who are sick. °You should make sure you get enough calcium and vitamin D while you are taking this medicine, unless your doctor tells you not to. Discuss the foods you eat and the vitamins you take with your health care professional. °See your dentist regularly. Brush and floss your teeth as directed. Before you have any dental work done, tell your dentist you are   receiving this medicine. Do not become pregnant while taking this medicine or for 5 months after stopping it. Talk with your doctor or health care professional about your birth control options while taking this medicine. Women should inform their doctor if they wish to become pregnant or think they might be pregnant. There is a potential for serious side  effects to an unborn child. Talk to your health care professional or pharmacist for more information. What side effects may I notice from receiving this medicine? Side effects that you should report to your doctor or health care professional as soon as possible:  allergic reactions like skin rash, itching or hives, swelling of the face, lips, or tongue  bone pain  breathing problems  dizziness  jaw pain, especially after dental work  redness, blistering, peeling of the skin  signs and symptoms of infection like fever or chills; cough; sore throat; pain or trouble passing urine  signs of low calcium like fast heartbeat, muscle cramps or muscle pain; pain, tingling, numbness in the hands or feet; seizures  unusual bleeding or bruising  unusually weak or tired Side effects that usually do not require medical attention (report to your doctor or health care professional if they continue or are bothersome):  constipation  diarrhea  headache  joint pain  loss of appetite  muscle pain  runny nose  tiredness  upset stomach This list may not describe all possible side effects. Call your doctor for medical advice about side effects. You may report side effects to FDA at 1-800-FDA-1088. Where should I keep my medicine? This medicine is only given in a clinic, doctor's office, or other health care setting and will not be stored at home. NOTE: This sheet is a summary. It may not cover all possible information. If you have questions about this medicine, talk to your doctor, pharmacist, or health care provider.  2021 [employer]vier/Gold Standard (2018-03-25 16:10:44)

## 2020-12-31 DIAGNOSIS — M5414 Radiculopathy, thoracic region: Secondary | ICD-10-CM | POA: Diagnosis not present

## 2021-01-07 DIAGNOSIS — M810 Age-related osteoporosis without current pathological fracture: Secondary | ICD-10-CM | POA: Diagnosis not present

## 2021-01-07 DIAGNOSIS — E781 Pure hyperglyceridemia: Secondary | ICD-10-CM | POA: Diagnosis not present

## 2021-01-07 DIAGNOSIS — R7301 Impaired fasting glucose: Secondary | ICD-10-CM | POA: Diagnosis not present

## 2021-01-14 DIAGNOSIS — G3184 Mild cognitive impairment, so stated: Secondary | ICD-10-CM | POA: Diagnosis not present

## 2021-01-14 DIAGNOSIS — M6281 Muscle weakness (generalized): Secondary | ICD-10-CM | POA: Diagnosis not present

## 2021-01-14 DIAGNOSIS — M810 Age-related osteoporosis without current pathological fracture: Secondary | ICD-10-CM | POA: Diagnosis not present

## 2021-01-14 DIAGNOSIS — M791 Myalgia, unspecified site: Secondary | ICD-10-CM | POA: Diagnosis not present

## 2021-01-14 DIAGNOSIS — E46 Unspecified protein-calorie malnutrition: Secondary | ICD-10-CM | POA: Diagnosis not present

## 2021-01-14 DIAGNOSIS — R296 Repeated falls: Secondary | ICD-10-CM | POA: Diagnosis not present

## 2021-01-14 DIAGNOSIS — Z0000 Encounter for general adult medical examination without abnormal findings: Secondary | ICD-10-CM | POA: Diagnosis not present

## 2021-01-14 DIAGNOSIS — I700 Atherosclerosis of aorta: Secondary | ICD-10-CM | POA: Diagnosis not present

## 2021-01-14 DIAGNOSIS — R1312 Dysphagia, oropharyngeal phase: Secondary | ICD-10-CM | POA: Diagnosis not present

## 2021-01-14 DIAGNOSIS — R82998 Other abnormal findings in urine: Secondary | ICD-10-CM | POA: Diagnosis not present

## 2021-01-22 DIAGNOSIS — R296 Repeated falls: Secondary | ICD-10-CM | POA: Diagnosis not present

## 2021-01-22 DIAGNOSIS — R079 Chest pain, unspecified: Secondary | ICD-10-CM | POA: Diagnosis not present

## 2021-01-24 ENCOUNTER — Other Ambulatory Visit (HOSPITAL_COMMUNITY): Payer: Self-pay

## 2021-01-24 DIAGNOSIS — R1310 Dysphagia, unspecified: Secondary | ICD-10-CM

## 2021-01-28 DIAGNOSIS — M5414 Radiculopathy, thoracic region: Secondary | ICD-10-CM | POA: Diagnosis not present

## 2021-01-28 DIAGNOSIS — S22000A Wedge compression fracture of unspecified thoracic vertebra, initial encounter for closed fracture: Secondary | ICD-10-CM | POA: Diagnosis not present

## 2021-01-29 ENCOUNTER — Ambulatory Visit (HOSPITAL_COMMUNITY)
Admission: RE | Admit: 2021-01-29 | Discharge: 2021-01-29 | Disposition: A | Discharge status: Home / Self Care | Payer: Medicare HMO | Source: Ambulatory Visit | Attending: Internal Medicine | Admitting: Internal Medicine

## 2021-01-29 ENCOUNTER — Other Ambulatory Visit: Payer: Self-pay

## 2021-01-29 DIAGNOSIS — R1310 Dysphagia, unspecified: Secondary | ICD-10-CM | POA: Diagnosis not present

## 2021-01-29 DIAGNOSIS — K219 Gastro-esophageal reflux disease without esophagitis: Secondary | ICD-10-CM | POA: Diagnosis not present

## 2021-02-04 DIAGNOSIS — C7951 Secondary malignant neoplasm of bone: Secondary | ICD-10-CM | POA: Diagnosis not present

## 2021-02-04 DIAGNOSIS — M546 Pain in thoracic spine: Secondary | ICD-10-CM | POA: Diagnosis not present

## 2021-02-04 DIAGNOSIS — M810 Age-related osteoporosis without current pathological fracture: Secondary | ICD-10-CM | POA: Diagnosis not present

## 2021-02-06 ENCOUNTER — Emergency Department (HOSPITAL_BASED_OUTPATIENT_CLINIC_OR_DEPARTMENT_OTHER): Payer: Medicare HMO

## 2021-02-06 ENCOUNTER — Encounter (HOSPITAL_COMMUNITY): Payer: Self-pay | Admitting: Family Medicine

## 2021-02-06 ENCOUNTER — Emergency Department (HOSPITAL_BASED_OUTPATIENT_CLINIC_OR_DEPARTMENT_OTHER): Payer: Medicare HMO | Admitting: Radiology

## 2021-02-06 ENCOUNTER — Inpatient Hospital Stay (HOSPITAL_BASED_OUTPATIENT_CLINIC_OR_DEPARTMENT_OTHER)
Admission: EM | Admit: 2021-02-06 | Discharge: 2021-02-11 | DRG: 084 | Disposition: A | Discharge status: Skilled Nursing Facility | Payer: Medicare HMO | Attending: Internal Medicine | Admitting: Internal Medicine

## 2021-02-06 ENCOUNTER — Other Ambulatory Visit: Payer: Self-pay

## 2021-02-06 DIAGNOSIS — I499 Cardiac arrhythmia, unspecified: Secondary | ICD-10-CM | POA: Diagnosis not present

## 2021-02-06 DIAGNOSIS — Z20822 Contact with and (suspected) exposure to covid-19: Secondary | ICD-10-CM | POA: Diagnosis present

## 2021-02-06 DIAGNOSIS — K5900 Constipation, unspecified: Secondary | ICD-10-CM | POA: Diagnosis not present

## 2021-02-06 DIAGNOSIS — Z79899 Other long term (current) drug therapy: Secondary | ICD-10-CM | POA: Diagnosis not present

## 2021-02-06 DIAGNOSIS — R55 Syncope and collapse: Secondary | ICD-10-CM

## 2021-02-06 DIAGNOSIS — M5414 Radiculopathy, thoracic region: Secondary | ICD-10-CM | POA: Diagnosis present

## 2021-02-06 DIAGNOSIS — Z043 Encounter for examination and observation following other accident: Secondary | ICD-10-CM | POA: Diagnosis not present

## 2021-02-06 DIAGNOSIS — S299XXA Unspecified injury of thorax, initial encounter: Secondary | ICD-10-CM | POA: Diagnosis not present

## 2021-02-06 DIAGNOSIS — R519 Headache, unspecified: Secondary | ICD-10-CM | POA: Diagnosis present

## 2021-02-06 DIAGNOSIS — S065XAA Traumatic subdural hemorrhage with loss of consciousness status unknown, initial encounter: Secondary | ICD-10-CM

## 2021-02-06 DIAGNOSIS — M549 Dorsalgia, unspecified: Secondary | ICD-10-CM | POA: Diagnosis not present

## 2021-02-06 DIAGNOSIS — E785 Hyperlipidemia, unspecified: Secondary | ICD-10-CM | POA: Diagnosis not present

## 2021-02-06 DIAGNOSIS — G8921 Chronic pain due to trauma: Secondary | ICD-10-CM | POA: Diagnosis not present

## 2021-02-06 DIAGNOSIS — M810 Age-related osteoporosis without current pathological fracture: Secondary | ICD-10-CM | POA: Diagnosis not present

## 2021-02-06 DIAGNOSIS — Z8673 Personal history of transient ischemic attack (TIA), and cerebral infarction without residual deficits: Secondary | ICD-10-CM

## 2021-02-06 DIAGNOSIS — I10 Essential (primary) hypertension: Secondary | ICD-10-CM

## 2021-02-06 DIAGNOSIS — G4489 Other headache syndrome: Secondary | ICD-10-CM | POA: Diagnosis not present

## 2021-02-06 DIAGNOSIS — R220 Localized swelling, mass and lump, head: Secondary | ICD-10-CM | POA: Diagnosis not present

## 2021-02-06 DIAGNOSIS — Z87891 Personal history of nicotine dependence: Secondary | ICD-10-CM | POA: Diagnosis not present

## 2021-02-06 DIAGNOSIS — Z7401 Bed confinement status: Secondary | ICD-10-CM | POA: Diagnosis not present

## 2021-02-06 DIAGNOSIS — W19XXXA Unspecified fall, initial encounter: Secondary | ICD-10-CM | POA: Diagnosis not present

## 2021-02-06 DIAGNOSIS — H3530 Unspecified macular degeneration: Secondary | ICD-10-CM | POA: Diagnosis present

## 2021-02-06 DIAGNOSIS — R000 Tachycardia, unspecified: Secondary | ICD-10-CM | POA: Diagnosis not present

## 2021-02-06 DIAGNOSIS — S0993XA Unspecified injury of face, initial encounter: Secondary | ICD-10-CM | POA: Diagnosis not present

## 2021-02-06 DIAGNOSIS — Y92009 Unspecified place in unspecified non-institutional (private) residence as the place of occurrence of the external cause: Secondary | ICD-10-CM

## 2021-02-06 DIAGNOSIS — S065X9A Traumatic subdural hemorrhage with loss of consciousness of unspecified duration, initial encounter: Secondary | ICD-10-CM | POA: Diagnosis not present

## 2021-02-06 DIAGNOSIS — G8929 Other chronic pain: Secondary | ICD-10-CM | POA: Diagnosis present

## 2021-02-06 DIAGNOSIS — G4730 Sleep apnea, unspecified: Secondary | ICD-10-CM | POA: Diagnosis present

## 2021-02-06 DIAGNOSIS — W1830XA Fall on same level, unspecified, initial encounter: Secondary | ICD-10-CM | POA: Diagnosis present

## 2021-02-06 DIAGNOSIS — Z9181 History of falling: Secondary | ICD-10-CM | POA: Diagnosis not present

## 2021-02-06 DIAGNOSIS — S065X9D Traumatic subdural hemorrhage with loss of consciousness of unspecified duration, subsequent encounter: Secondary | ICD-10-CM | POA: Diagnosis not present

## 2021-02-06 DIAGNOSIS — R410 Disorientation, unspecified: Secondary | ICD-10-CM | POA: Diagnosis not present

## 2021-02-06 DIAGNOSIS — M2550 Pain in unspecified joint: Secondary | ICD-10-CM | POA: Diagnosis not present

## 2021-02-06 DIAGNOSIS — K219 Gastro-esophageal reflux disease without esophagitis: Secondary | ICD-10-CM | POA: Diagnosis not present

## 2021-02-06 DIAGNOSIS — I6200 Nontraumatic subdural hemorrhage, unspecified: Secondary | ICD-10-CM | POA: Diagnosis present

## 2021-02-06 DIAGNOSIS — S065X0A Traumatic subdural hemorrhage without loss of consciousness, initial encounter: Secondary | ICD-10-CM | POA: Diagnosis not present

## 2021-02-06 DIAGNOSIS — Z66 Do not resuscitate: Secondary | ICD-10-CM | POA: Diagnosis not present

## 2021-02-06 DIAGNOSIS — R296 Repeated falls: Secondary | ICD-10-CM | POA: Diagnosis present

## 2021-02-06 LAB — URINALYSIS, ROUTINE W REFLEX MICROSCOPIC
Bilirubin Urine: NEGATIVE
Glucose, UA: NEGATIVE mg/dL
Hgb urine dipstick: NEGATIVE
Ketones, ur: NEGATIVE mg/dL
Leukocytes,Ua: NEGATIVE
Nitrite: NEGATIVE
Protein, ur: NEGATIVE mg/dL
Specific Gravity, Urine: 1.008 (ref 1.005–1.030)
pH: 7.5 (ref 5.0–8.0)

## 2021-02-06 LAB — COMPREHENSIVE METABOLIC PANEL
ALT: 17 U/L (ref 0–44)
AST: 31 U/L (ref 15–41)
Albumin: 4.3 g/dL (ref 3.5–5.0)
Alkaline Phosphatase: 49 U/L (ref 38–126)
Anion gap: 9 (ref 5–15)
BUN: 11 mg/dL (ref 8–23)
CO2: 28 mmol/L (ref 22–32)
Calcium: 9.4 mg/dL (ref 8.9–10.3)
Chloride: 99 mmol/L (ref 98–111)
Creatinine, Ser: 0.66 mg/dL (ref 0.44–1.00)
GFR, Estimated: >60 mL/min (ref >60)
Glucose, Bld: 101 mg/dL — ABNORMAL HIGH (ref 70–99)
Potassium: 3.8 mmol/L (ref 3.5–5.1)
Sodium: 136 mmol/L (ref 135–145)
Total Bilirubin: 0.5 mg/dL (ref 0.3–1.2)
Total Protein: 7.2 g/dL (ref 6.5–8.1)

## 2021-02-06 LAB — PROTIME-INR
INR: 1.1 (ref 0.8–1.2)
Prothrombin Time: 14.1 seconds (ref 11.4–15.2)

## 2021-02-06 LAB — CBC WITH DIFFERENTIAL/PLATELET
Abs Immature Granulocytes: 0.07 10*3/uL (ref 0.00–0.07)
Basophils Absolute: 0 10*3/uL (ref 0.0–0.1)
Basophils Relative: 0 %
Eosinophils Absolute: 0 10*3/uL (ref 0.0–0.5)
Eosinophils Relative: 0 %
HCT: 41 % (ref 36.0–46.0)
Hemoglobin: 13.6 g/dL (ref 12.0–15.0)
Immature Granulocytes: 1 %
Lymphocytes Relative: 13 %
Lymphs Abs: 1 10*3/uL (ref 0.7–4.0)
MCH: 28.6 pg (ref 26.0–34.0)
MCHC: 33.2 g/dL (ref 30.0–36.0)
MCV: 86.1 fL (ref 80.0–100.0)
Monocytes Absolute: 0.7 10*3/uL (ref 0.1–1.0)
Monocytes Relative: 8 %
Neutro Abs: 6 10*3/uL (ref 1.7–7.7)
Neutrophils Relative %: 78 %
Platelets: 164 10*3/uL (ref 150–400)
RBC: 4.76 MIL/uL (ref 3.87–5.11)
RDW: 14.6 % (ref 11.5–15.5)
WBC: 7.8 10*3/uL (ref 4.0–10.5)
nRBC: 0 % (ref 0.0–0.2)

## 2021-02-06 LAB — APTT: aPTT: 29 seconds (ref 24–36)

## 2021-02-06 LAB — SARS CORONAVIRUS 2 (TAT 6-24 HRS): SARS Coronavirus 2: NEGATIVE

## 2021-02-06 MED ORDER — HYDROCODONE-ACETAMINOPHEN 5-325 MG PO TABS
1.0000 | ORAL_TABLET | Freq: Four times a day (QID) | ORAL | Status: DC | PRN
  Administered 2021-02-06 – 2021-02-10 (×5): 1 via ORAL
  Filled 2021-02-06 (×7): qty 1

## 2021-02-06 MED ORDER — MAGNESIUM SULFATE (LAXATIVE) PO GRAN: 10.0000 g | GRANULES | Freq: Every evening | ORAL | Status: DC | PRN

## 2021-02-06 MED ORDER — ONDANSETRON HCL 4 MG PO TABS: 4.0000 mg | ORAL_TABLET | Freq: Four times a day (QID) | ORAL | Status: DC | PRN

## 2021-02-06 MED ORDER — LABETALOL HCL 5 MG/ML IV SOLN
10.0000 mg | INTRAVENOUS | Status: DC | PRN
  Administered 2021-02-07 – 2021-02-08 (×2): 10 mg via INTRAVENOUS
  Filled 2021-02-06 (×2): qty 4

## 2021-02-06 MED ORDER — POTASSIUM CHLORIDE IN NACL 20-0.45 MEQ/L-% IV SOLN
INTRAVENOUS | Status: AC
  Filled 2021-02-06: qty 1000

## 2021-02-06 MED ORDER — ACETAMINOPHEN 500 MG PO TABS
1000.0000 mg | ORAL_TABLET | Freq: Once | ORAL | Status: AC
  Administered 2021-02-06: 1000 mg via ORAL
  Filled 2021-02-06: qty 2

## 2021-02-06 MED ORDER — SENNOSIDES-DOCUSATE SODIUM 8.6-50 MG PO TABS: 1.0000 | ORAL_TABLET | Freq: Every evening | ORAL | Status: DC | PRN

## 2021-02-06 MED ORDER — GABAPENTIN 100 MG PO CAPS
100.0000 mg | ORAL_CAPSULE | Freq: Every day | ORAL | Status: DC
  Administered 2021-02-06 – 2021-02-10 (×5): 100 mg via ORAL
  Filled 2021-02-06 (×5): qty 1

## 2021-02-06 MED ORDER — SENNA 8.6 MG PO TABS: 1.0000 | ORAL_TABLET | Freq: Every evening | ORAL | Status: DC | PRN

## 2021-02-06 MED ORDER — PANTOPRAZOLE SODIUM 40 MG PO TBEC
40.0000 mg | DELAYED_RELEASE_TABLET | Freq: Every day | ORAL | Status: DC
  Administered 2021-02-07 – 2021-02-11 (×5): 40 mg via ORAL
  Filled 2021-02-06 (×5): qty 1

## 2021-02-06 MED ORDER — FENTANYL CITRATE (PF) 100 MCG/2ML IJ SOLN
25.0000 ug | INTRAMUSCULAR | Status: DC | PRN
  Administered 2021-02-06 – 2021-02-07 (×2): 25 ug via INTRAVENOUS
  Filled 2021-02-06 (×3): qty 2

## 2021-02-06 MED ORDER — SODIUM CHLORIDE 0.9 % IV BOLUS
1000.0000 mL | Freq: Once | INTRAVENOUS | Status: AC
  Administered 2021-02-06: 1000 mL via INTRAVENOUS

## 2021-02-06 MED ORDER — MELATONIN 3 MG PO TABS
3.0000 mg | ORAL_TABLET | Freq: Every evening | ORAL | Status: DC | PRN
  Administered 2021-02-06 – 2021-02-09 (×3): 3 mg via ORAL
  Filled 2021-02-06 (×3): qty 1

## 2021-02-06 MED ORDER — ONDANSETRON HCL 4 MG/2ML IJ SOLN: 4.0000 mg | Freq: Four times a day (QID) | INTRAMUSCULAR | Status: DC | PRN

## 2021-02-06 MED ORDER — ACETAMINOPHEN 325 MG PO TABS
650.0000 mg | ORAL_TABLET | Freq: Four times a day (QID) | ORAL | Status: DC | PRN
  Administered 2021-02-09 – 2021-02-11 (×3): 650 mg via ORAL
  Filled 2021-02-06 (×4): qty 2

## 2021-02-06 MED ORDER — FENTANYL CITRATE (PF) 100 MCG/2ML IJ SOLN
25.0000 ug | Freq: Once | INTRAMUSCULAR | Status: AC
  Administered 2021-02-06: 25 ug via INTRAVENOUS
  Filled 2021-02-06: qty 2

## 2021-02-06 NOTE — ED Triage Notes (Signed)
Pt fell this morning at 0830 and "semiconscious" according to the husband. EMS called and assisted Pt up off the floor. Pt has complained of a headache and worsened

## 2021-02-06 NOTE — H&P (Signed)
History and Physical    [name] [name] QJJ:[id number] DOB: 1936/02/28 DOA: 02/06/2021  PCP: Ginger Organ., MD   Patient coming from: Home   Chief Complaint: Fall with confusion and head injury   HPI: [name] [name] is a 85 y.o. female with medical history significant for chronic back pain with radiculopathy, GERD with peptic stricture, balance difficulty, falls with subdural hematoma 1 year ago, now presenting to the emergency department with confusion and scalp and facial ecchymosis after a fall this morning.  Patient is accompanied by her husband who assists with the history.  Patient reported waking in her usual state this morning, does not remember falling, but her husband heard the fall at approximately 8:30 AM and found the patient on the floor with her walker underneath her, and was "semiconscious."  She rapidly became more alert but was more confused than usual and complaining of headache.  EMS evaluated the patient, she seemed to be in proving, and family initially elected to observe her at home but she continued to have a headache and was brought into [place] for further evaluation.  At baseline, patient has difficulty with balance, uses a walker to get around at home, and has some element of confusion and disorientation per report of her husband.  [facility] ED Course: Upon arrival to the ED, patient is found to be afebrile, saturating well on room air, tachycardic in the 110s, and with blood pressure 142/105.  Chest x-rays negative for acute cardiopulmonary disease but notable for chronic compression fractures and age-indeterminate T7 fracture.  Chemistry panel and CBC were unremarkable.  Urinalysis unremarkable.  Cervical spine CT negative for acute fracture but notable for severe multilevel degenerative changes.  Maxillofacial CT revealed soft tissue swelling without acute osseous abnormality.  Small foci of acute subdural hematoma was noted on the right side  on head CT.  Neurosurgery was consulted by the ED physician and reviewed the scans and did not feel that admission or further follow-up was needed for the tiny SDH.  Patient was given a liter of saline, acetaminophen, and fentanyl.  COVID-19 screening test not yet resulted.  Patient continued to be more confused than usual and was transferred to [facility] for further evaluation and management.  Review of Systems:  All other systems reviewed and apart from HPI, are negative.  Past Medical History:  Diagnosis Date  . Acid reflux disease   . Anemia    history of  . Arthritis    both hands  . Blood transfusion without reported diagnosis   . Cataract    bilateral cateracts removed  . Common migraine   . Diverticulosis    patient denies  . DJD (degenerative joint disease)   . Esophageal stricture   . Herniated disc   . Hiatal hernia 2009   EGD-Small   . History of colonic polyps   . Hyperlipidemia   . Lacunar infarction ([place])   . Macular degeneration   . Macular degeneration 2016   per husband  . Osteoporosis   . Sleep apnea    has c-pap but does not wear  . Spinal stress fracture 2019   per husband 09/29/2018    Past Surgical History:  Procedure Laterality Date  . CHOLECYSTECTOMY     4-5 YRS AGO.  Marland Kitchen COLONOSCOPY    . ESOPHAGEAL DILATION    . EYE SURGERY     BILATERAL CATARAT  . INGUINAL HERNIA REPAIR     rt.  Marland Kitchen  RECTOCELE REPAIR N/A 02/13/2013   Procedure: POSTERIOR REPAIR (RECTOCELE);  Surgeon: Emily Filbert, MD;  Location: [facility];  Service: Gynecology;  Laterality: N/A;  . ROBOTIC ASSISTED LAPAROSCOPIC LYSIS OF ADHESION  02/13/2016   Procedure: ROBOTIC ASSISTED LAPAROSCOPIC LYSIS OF ADHESION;  Surgeon: Princess Bruins, MD;  Location: [facility];  Service: Gynecology;;  . ROBOTIC ASSISTED LAPAROSCOPIC SACROCOLPOPEXY N/A 02/13/2016   Procedure: ROBOTIC ASSISTED LAPAROSCOPIC SACROCOLPOPEXY With Theola Sequin;  Surgeon: Princess Bruins, MD;  Location: [facility];  Service:  Gynecology;  Laterality: N/A;  . TOTAL ABDOMINAL HYSTERECTOMY    . UPPER GASTROINTESTINAL ENDOSCOPY      Social History:   reports that she has quit smoking. Her smoking use included cigarettes. She quit after 10.00 years of use. She has never used smokeless tobacco. She reports that she does not drink alcohol and does not use drugs.  Allergies  Allergen Reactions  . Isometheptene-Dichloral-Apap Nausea Only    Midrin  . Oxycodone Hcl Nausea And Vomiting and Other (See Comments)    Pt reports tolerating Vicodin    Family History  Problem Relation Age of Onset  . Breast cancer Maternal Aunt   . Colon cancer Maternal Aunt   . Cancer Brother        kidney cancer  . Aneurysm Sister 31       died   . Breast cancer Paternal Aunt   . Esophageal cancer Neg Hx   . Rectal cancer Neg Hx   . Stomach cancer Neg Hx      Prior to Admission medications   Medication Sig Start Date End Date Taking? Authorizing Provider  Abaloparatide (TYMLOS [place]) Inject into the skin daily.    [provider]  acetaminophen (TYLENOL) 500 MG tablet Take 500 mg by mouth every 6 (six) hours as needed.    [provider]  gabapentin (NEURONTIN) 100 MG capsule Take 100 mg by mouth 3 (three) times daily. 01/10/20   [provider]  levETIRAcetam (KEPPRA) 500 MG tablet Take 1 tablet (500 mg total) by mouth 2 (two) times daily. Patient not taking: Reported on 07/30/2020 02/09/20   Quintella Reichert, MD  loratadine (CLARITIN) 10 MG tablet Take 10 mg by mouth daily.    [provider]  Multiple Vitamins-Minerals (CENTRUM SILVER ADULT 50+ PO) Take by mouth.    [provider]  pantoprazole (PROTONIX) 40 MG tablet Take 40 mg by mouth daily.    [provider]  polyethylene glycol (MIRALAX / GLYCOLAX) packet Take 17 g by mouth 2 (two) times daily as needed. 10/03/18   Virgel Manifold, MD    Physical Exam: Vitals:   02/06/21 1545 02/06/21 1610 02/06/21 1630 02/06/21 1755   BP:  (!) 175/85 (!) 178/91 (!) 142/105  Pulse: (!) 113 (!) 108 (!) 110 (!) 108  Resp: (!) 23 20 19  (!) 25  Temp:    97.8 F (36.6 C)  TempSrc:    Oral  SpO2: 99% 98% 96% 96%  Weight:      Height:        Constitutional: NAD, calm  Eyes: PERTLA, periorbital ecchymoses right > left.  ENMT: Mucous membranes are moist. Posterior pharynx clear of any exudate or lesions.   Neck:  supple, no masses  Respiratory: clear to auscultation bilaterally, no wheezing, no crackles. No accessory muscle use.  Cardiovascular: S1 & S2 heard, regular rate and rhythm. No extremity edema.   Abdomen: No distension, no tenderness, soft. Bowel sounds active.  Musculoskeletal: no clubbing /  cyanosis. No joint deformity upper and lower extremities.   Skin: Ecchymosis and swelling involving right face and frontal scalp. Warm, dry, well-perfused. Neurologic: CN 2-12 grossly intact. Sensation intact. Moving all extremities.  Psychiatric: Alert and oriented to person and place, can state her birthday but not current month or year. Pleasant and cooperative.    Labs and Imaging on Admission: I have personally reviewed following labs and imaging studies  CBC: Recent Labs  Lab 02/06/21 1450  WBC 7.8  NEUTROABS 6.0  HGB 13.6  HCT 41.0  MCV 86.1  PLT 485   Basic Metabolic Panel: Recent Labs  Lab 02/06/21 1450  NA 136  K 3.8  CL 99  CO2 28  GLUCOSE 101*  BUN 11  CREATININE 0.66  CALCIUM 9.4   GFR: Estimated Creatinine Clearance: 46.8 mL/min (by C-G formula based on SCr of 0.66 mg/dL). Liver Function Tests: Recent Labs  Lab 02/06/21 1450  AST 31  ALT 17  ALKPHOS 49  BILITOT 0.5  PROT 7.2  ALBUMIN 4.3   No results for input(s): LIPASE, AMYLASE in the last 168 hours. No results for input(s): AMMONIA in the last 168 hours. Coagulation Profile: Recent Labs  Lab 02/06/21 1450  INR 1.1   Cardiac Enzymes: No results for input(s): CKTOTAL, CKMB, CKMBINDEX, TROPONINI in the last 168  hours. BNP (last 3 results) No results for input(s): PROBNP in the last 8760 hours. HbA1C: No results for input(s): HGBA1C in the last 72 hours. CBG: No results for input(s): GLUCAP in the last 168 hours. Lipid Profile: No results for input(s): CHOL, HDL, LDLCALC, TRIG, CHOLHDL, LDLDIRECT in the last 72 hours. Thyroid Function Tests: No results for input(s): TSH, T4TOTAL, FREET4, T3FREE, THYROIDAB in the last 72 hours. Anemia Panel: No results for input(s): VITAMINB12, FOLATE, FERRITIN, TIBC, IRON, RETICCTPCT in the last 72 hours. Urine analysis:    Component Value Date/Time   COLORURINE COLORLESS (A) 02/06/2021 1500   APPEARANCEUR CLEAR 02/06/2021 1500   LABSPEC 1.008 02/06/2021 1500   PHURINE 7.5 02/06/2021 1500   GLUCOSEU NEGATIVE 02/06/2021 1500   GLUCOSEU NEGATIVE 09/26/2018 1104   HGBUR NEGATIVE 02/06/2021 1500   BILIRUBINUR NEGATIVE 02/06/2021 1500   KETONESUR NEGATIVE 02/06/2021 1500   PROTEINUR NEGATIVE 02/06/2021 1500   UROBILINOGEN 0.2 09/26/2018 1104   NITRITE NEGATIVE 02/06/2021 1500   LEUKOCYTESUR NEGATIVE 02/06/2021 1500   Sepsis Labs: @LABRCNTIP (procalcitonin:4,lacticidven:4) )No results found for this or any previous visit (from the past 240 hour(s)).   Radiological Exams on Admission: DG Chest 2 View  Result Date: 02/06/2021 CLINICAL DATA:  Status post trauma. EXAM: CHEST - 2 VIEW COMPARISON:  July 24, 2017 FINDINGS: Decreased lung volumes are seen which is likely secondary to the degree of patient inspiration. There is no evidence of acute infiltrate, pleural effusion or pneumothorax. The heart size and mediastinal contours are within normal limits. There is moderate severity calcification of the aortic arch. Chronic compression fracture deformities are seen at the levels of T9 and T12. An additional compression fracture deformity of indeterminate age is noted at the level of T7. Radiopaque surgical clips are seen within the right upper quadrant. IMPRESSION:  1. No acute cardiopulmonary disease. 2. Chronic compression fracture deformities of T9 and T12. 3. Age-indeterminate compression fracture deformity of T7. Electronically Signed   By: Virgina Norfolk M.D.   On: 02/06/2021 15:11   CT Head Wo Contrast  Result Date: 02/06/2021 CLINICAL DATA:  Status post fall. EXAM: CT HEAD WITHOUT CONTRAST TECHNIQUE: Contiguous axial images were obtained  from the base of the skull through the vertex without intravenous contrast. COMPARISON:  None. FINDINGS: Brain: There is mild cerebral atrophy with widening of the extra-axial spaces and ventricular dilatation. There are areas of decreased attenuation within the white matter tracts of the supratentorial brain, consistent with microvascular disease changes. A 1.2 cm x 0.5 cm x 1.5 cm and 0.3 cm x 0.3 cm x 0.3 cm areas of acute subdural blood are seen within the frontal lobe on the right (axial CT images 14 and 15, CT series number 2). A similar appearing 0.5 cm x 0.3 cm x 0.5 cm subdural focus of acute blood is noted within the frontal parietal region on the right (coronal reformatted image 45, CT series number 4). There is no evidence of associated mass effect or midline shift. Vascular: No hyperdense vessel or unexpected calcification. Skull: Normal. Negative for fracture or focal lesion. Sinuses/Orbits: There is moderate severity bilateral ethmoid sinus mucosal thickening. Other: Moderate severity right periorbital, right preseptal and right frontal scalp soft tissue swelling is seen. Moderate severity right facial soft tissue swelling is also noted. IMPRESSION: 1. Small foci of acute subdural blood within the right frontal lobe and right frontal parietal region. MRI correlation is recommended. 2. Right facial, right periorbital, right preseptal and right frontal scalp soft tissue swelling. 3. Mild cerebral atrophy with widening of the extra-axial spaces and ventricular dilatation. 4. Moderate severity bilateral ethmoid sinus  disease. Electronically Signed   By: Virgina Norfolk M.D.   On: 02/06/2021 15:25   CT Cervical Spine Wo Contrast  Result Date: 02/06/2021 CLINICAL DATA:  Status post fall. EXAM: CT CERVICAL SPINE WITHOUT CONTRAST TECHNIQUE: Multidetector CT imaging of the cervical spine was performed without intravenous contrast. Multiplanar CT image reconstructions were also generated. COMPARISON:  None. FINDINGS: Alignment: Approximately 1 mm to 2 mm anterolisthesis of the C5 vertebral body is noted on C6. Skull base and vertebrae: No acute fracture. Chronic changes are seen involving the body and tip of the dens. No primary bone lesion or focal pathologic process. Soft tissues and spinal canal: No prevertebral fluid or swelling. No visible canal hematoma. Disc levels: Moderate to marked severity endplate sclerosis is seen at the level of C6-C7. There is marked severity narrowing of the anterior atlantoaxial articulation. Marked severity intervertebral disc space narrowing is present at the level of C6-C7. Bilateral marked severity multilevel facet joint hypertrophy is noted. Upper chest: Mild biapical scarring is seen. Other: None. IMPRESSION: 1. Marked severity multilevel degenerative changes, most prominent at the level of C6-C7. 2. Approximately 1 mm to 2 mm anterolisthesis of the C5 vertebral body on C6. 3. No evidence of an acute fracture within the cervical spine. Electronically Signed   By: Virgina Norfolk M.D.   On: 02/06/2021 15:32   CT Maxillofacial Wo Contrast  Result Date: 02/06/2021 CLINICAL DATA:  Status post trauma. EXAM: CT MAXILLOFACIAL WITHOUT CONTRAST TECHNIQUE: Multidetector CT imaging of the maxillofacial structures was performed. Multiplanar CT image reconstructions were also generated. COMPARISON:  None. FINDINGS: Osseous: No fracture or mandibular dislocation. No destructive process. Orbits: Negative. No traumatic or inflammatory finding. Sinuses: There is moderate severity bilateral ethmoid  sinus mucosal thickening. Soft tissues: There is mild right facial soft tissue swelling. Moderate severity right periorbital, right preseptal and right frontal scalp soft tissue swelling is also noted. Limited intracranial: Small foci of acute subdural blood are seen within the right frontal and right frontal parietal regions (described in detail on the plain brain CT). IMPRESSION: 1. Small  foci of acute subdural blood within the right frontal and right frontal parietal regions (described in detail on the plain brain CT). 2. Right facial, right periorbital, right preseptal and right frontal scalp soft tissue swelling. 3. No evidence of an acute osseous abnormality. Electronically Signed   By: Virgina Norfolk M.D.   On: 02/06/2021 15:27    Assessment/Plan  1. SDH; mild TBI; falls  - Presents after a fall at home with question of brief LOC and mild confusion that has been slowly improving and is found to have small SDH  - Neurosurgery reviewed scans and did not feel admission or follow-up for the SDH was necessary but there was concern that patient was not yet back to baseline mental status  - Per husband at bedside, she is continuing to improve and close to baseline by time of arrival to [facility]  - Continue supportive care, monitoring, and consult PT    2. Chronic back pain with radiculopathy  - There was an age-indeterminate T7 fracture on radiographs in ED but no new back pain  - Continue gabapentin 100 mg qHS    3. Hypertension; tachycardia  - BP and HR elevated since presentation, improved with analgesics and likely related to pain  - Check EKG, continue pain-control, treat BP as-needed    4. GERD  - Continue PPI     DVT prophylaxis: SCDs  Code Status: DNR, confirmed with husband  Level of Care: Level of care: Telemetry Medical Family Communication: Husband  Disposition Plan:  Patient is from: Home  Anticipated d/c is to: TBD Anticipated d/c date is: 02/07/21 Patient currently: Pending  continued improvement in mental status, PT eval, orthostatic vitals  Consults called: None  Admission status: Observation     Vianne Bulls, MD [facility]  02/06/2021, 8:07 PM

## 2021-02-06 NOTE — ED Notes (Signed)
Pt asking for pain med. Informed Cyndi - RN

## 2021-02-06 NOTE — ED Notes (Signed)
Spouse reported that EMS assisted Pt up off the floor. Nurse asked why did EMS not transport the patient to the emergency room and spouse reported due to the cost of transport. Pt continued to have a headache that worsened and Spouse was going to wait until his son came home to evaluate her (son is a Psychologist, sport and exercise) decided to bring patient here for an evaluation.

## 2021-02-06 NOTE — ED Triage Notes (Signed)
Pt has large bruising and swelling noted to the Right forehead and eye

## 2021-02-06 NOTE — Progress Notes (Signed)
Patient arrived to unit, report given to oncoming night shift RN

## 2021-02-06 NOTE — ED Notes (Signed)
ED Provider at bedside. 

## 2021-02-06 NOTE — ED Provider Notes (Signed)
[facility] EMERGENCY DEPT Provider Note   CSN: [id number] Arrival date & time: 02/06/21  1336     History Chief Complaint  Patient presents with  . Fall    [name] [name] is a 85 y.o. female.  Patient is an 85 year old female with a history of prior subdural hemorrhage 1 year ago due to recurrent falls and balance issues, hiatal hernia, ongoing back pain with multiple thoracic compression fxs seeing pain management, anemia and advanced macular degeneration who is presenting today after a fall at home around 830 this morning.  Patient reports that yesterday was a normal day for her she went to bed feeling fine other than the ongoing pain she has had in her back which is not new when she has been getting injections for.  She woke up this morning feeling her normal self and went into the living room to go through some close.  She remembers going through the close but reports she was not having any chest pain, palpitations, shortness of breath, feeling lightheaded or dizzy but does not remember falling but remembers waking up on the floor with EMS.  Husband reports that he heard her fall and when he went in she was lying on top of her walker laying in between the couch in the chair with some bleeding from the right side of her forehead.  She was moaning and semiconscious.  She returned to baseline fairly quickly per his report and EMS was called to help get her off the floor.  She has been complaining about of headache since the fall this morning but they were scared to give her any medications.  She did go and lay down for a while and was able to eat a smoothie and drink some water and has not had any nausea or vomiting.  She denies any visual changes.  She has had more difficulty with balance and walking since her subdural hemorrhage a year ago but her husband did report when they were using the walker to get out to the car he had to remind her to pick up her right foot which is not  usual.  She denies any chest pain, shortness of breath or palpitations currently.  She denies any recent illness and has not had fever or abdominal pain or urinary symptoms recently.  She denies any pain in her legs.  The history is provided by the patient and the spouse.  Fall       Past Medical History:  Diagnosis Date  . Acid reflux disease   . Anemia    history of  . Arthritis    both hands  . Blood transfusion without reported diagnosis   . Cataract    bilateral cateracts removed  . Common migraine   . Diverticulosis    patient denies  . DJD (degenerative joint disease)   . Esophageal stricture   . Herniated disc   . Hiatal hernia 2009   EGD-Small   . History of colonic polyps   . Hyperlipidemia   . Lacunar infarction ([place])   . Macular degeneration   . Macular degeneration 2016   per husband  . Osteoporosis   . Sleep apnea    has c-pap but does not wear  . Spinal stress fracture 2019   per husband 09/29/2018    Patient Active Problem List   Diagnosis Date Noted  . Advanced nonexudative age-related macular degeneration of both eyes with subfoveal involvement 07/01/2020  . Degenerative retinal drusen of both  eyes 07/01/2020  . Pseudophakia 07/01/2020  . Postoperative state 02/13/2016  . Chest pain with moderate risk of acute coronary syndrome 10/08/2014  . Sepsis syndrome 06/26/2013  . Acute pyelonephritis 06/26/2013  . Migraine 03/28/2013  . Rectocele 02/13/2013  . Sleep apnea 02/16/2012  . Chronic daily headache 01/12/2012  . OSTEOPENIA 10/17/2008  . ESOPHAGEAL STRICTURE 05/02/2008  . DIVERTICULOSIS-COLON 05/02/2008  . DIVERTICULITIS-COLON 05/02/2008  . ABDOMINAL PAIN-EPIGASTRIC 05/02/2008  . GASTRITIS, ACUTE 03/27/2008  . LEG CRAMPS, NOCTURNAL 02/10/2008  . Headache(784.0) 02/10/2008  . OTHER DYSPHAGIA 02/10/2008  . HYPERLIPIDEMIA 02/06/2008  . LACUNAR INFARCTION 02/06/2008  . COLONIC POLYPS 10/13/2007  . Migraine without aura 09/27/2007  .  Esophageal reflux 09/27/2007  . HERNIATED DISC 09/27/2007    Past Surgical History:  Procedure Laterality Date  . CHOLECYSTECTOMY     4-5 YRS AGO.  Marland Kitchen COLONOSCOPY    . ESOPHAGEAL DILATION    . EYE SURGERY     BILATERAL CATARAT  . INGUINAL HERNIA REPAIR     rt.  Marland Kitchen RECTOCELE REPAIR N/A 02/13/2013   Procedure: POSTERIOR REPAIR (RECTOCELE);  Surgeon: Emily Filbert, MD;  Location: [facility];  Service: Gynecology;  Laterality: N/A;  . ROBOTIC ASSISTED LAPAROSCOPIC LYSIS OF ADHESION  02/13/2016   Procedure: ROBOTIC ASSISTED LAPAROSCOPIC LYSIS OF ADHESION;  Surgeon: Princess Bruins, MD;  Location: [facility];  Service: Gynecology;;  . ROBOTIC ASSISTED LAPAROSCOPIC SACROCOLPOPEXY N/A 02/13/2016   Procedure: ROBOTIC ASSISTED LAPAROSCOPIC SACROCOLPOPEXY With Theola Sequin;  Surgeon: Princess Bruins, MD;  Location: [facility];  Service: Gynecology;  Laterality: N/A;  . TOTAL ABDOMINAL HYSTERECTOMY    . UPPER GASTROINTESTINAL ENDOSCOPY       OB History    Gravida  3   Para  3   Term      Preterm      AB      Living  3     SAB      IAB      Ectopic      Multiple      Live Births              Family History  Problem Relation Age of Onset  . Breast cancer Maternal Aunt   . Colon cancer Maternal Aunt   . Cancer Brother        kidney cancer  . Aneurysm Sister 20       died   . Breast cancer Paternal Aunt   . Esophageal cancer Neg Hx   . Rectal cancer Neg Hx   . Stomach cancer Neg Hx     Social History   Tobacco Use  . Smoking status: Former Smoker    Years: 10.00    Types: Cigarettes  . Smokeless tobacco: Never Used  Vaping Use  . Vaping Use: Never used  Substance Use Topics  . Alcohol use: No  . Drug use: No    Home Medications Prior to Admission medications   Medication Sig Start Date End Date Taking? Authorizing Provider  Abaloparatide (TYMLOS [place]) Inject into the skin daily.    [provider]  acetaminophen (TYLENOL) 500 MG tablet Take 500 mg by mouth  every 6 (six) hours as needed.    [provider]  gabapentin (NEURONTIN) 100 MG capsule Take 100 mg by mouth 3 (three) times daily. 01/10/20   [provider]  levETIRAcetam (KEPPRA) 500 MG tablet Take 1 tablet (500 mg total) by mouth 2 (two) times daily. Patient not taking: Reported on 07/30/2020 02/09/20  Quintella Reichert, MD  loratadine (CLARITIN) 10 MG tablet Take 10 mg by mouth daily.    [provider]  Multiple Vitamins-Minerals (CENTRUM SILVER ADULT 50+ PO) Take by mouth.    [provider]  pantoprazole (PROTONIX) 40 MG tablet Take 40 mg by mouth daily.    [provider]  polyethylene glycol (MIRALAX / GLYCOLAX) packet Take 17 g by mouth 2 (two) times daily as needed. 10/03/18   Virgel Manifold, MD    Allergies    Isometheptene-dichloral-apap and Oxycodone hcl  Review of Systems   Review of Systems  All other systems reviewed and are negative.   Physical Exam Updated Vital Signs BP (!) 180/93 (BP Location: Right Arm)   Pulse (!) 113   Temp 98.2 F (36.8 C) (Oral)   Resp 17   Ht 5\' 5"  (1.651 m)   Wt 54.7 kg   SpO2 97%   BMI 20.07 kg/m   Physical Exam Vitals and nursing note reviewed.  Constitutional:      General: She is not in acute distress.    Appearance: She is well-developed and normal weight.  HENT:     Head: Normocephalic. Raccoon eyes present.      Right Ear: Tympanic membrane normal.     Left Ear: Tympanic membrane normal.     Nose: Nose normal.     Right Nostril: No epistaxis.     Left Nostril: No epistaxis.  Eyes:     Extraocular Movements: Extraocular movements intact.     Conjunctiva/sclera:     Right eye: Hemorrhage present.     Pupils: Pupils are equal, round, and reactive to light.   Cardiovascular:     Rate and Rhythm: Regular rhythm. Tachycardia present.     Heart sounds: Normal heart sounds. No murmur heard. No friction rub.  Pulmonary:     Effort: Pulmonary effort is normal.     Breath  sounds: Normal breath sounds. No wheezing or rales.  Abdominal:     General: Bowel sounds are normal. There is no distension.     Palpations: Abdomen is soft.     Tenderness: There is no abdominal tenderness. There is no guarding or rebound.  Musculoskeletal:        General: No tenderness. Normal range of motion.     Cervical back: Normal range of motion and neck supple. No rigidity or tenderness.     Right lower leg: No edema.     Left lower leg: No edema.     Comments: No edema.  Full range of motion of bilateral hips, wrists and shoulders without pain  Skin:    General: Skin is warm and dry.     Capillary Refill: Capillary refill takes less than 2 seconds.     Findings: No rash.  Neurological:     Mental Status: She is alert.     Cranial Nerves: No cranial nerve deficit.     Sensory: No sensory deficit.     Comments: Mild word finding difficulty which seems to be baseline per her husband.  No facial asymmetry or visual field cuts.  No pronator drift noted in any of the 4 extremities.  Strength is 5 out of 5 in all 4 extremities.  Sensation is intact  Psychiatric:        Mood and Affect: Mood normal.        Behavior: Behavior normal.        Thought Content: Thought content normal.     ED Results /  Procedures / Treatments   Labs (all labs ordered are listed, but only abnormal results are displayed) Labs Reviewed - No data to display  EKG None  Radiology No results found.  Procedures Procedures   Medications Ordered in ED Medications  acetaminophen (TYLENOL) tablet 1,000 mg (has no administration in time range)  sodium chloride 0.9 % bolus 1,000 mL (has no administration in time range)    ED Course  I have reviewed the triage vital signs and the nursing notes.  Pertinent labs & imaging results that were available during my care of the patient were reviewed by me and considered in my medical decision making (see chart for details).    MDM Rules/Calculators/A&P                           Patient with a fall today at home and injury to the right side of her face and head.  Unclear if patient had a syncopal event causing the fall or with falling she hit her head hard enough to cause loss of consciousness.  Patient does not take any anticoagulants but does have a prior history of subdural hemorrhage 1 year ago.  Patient does not take any seizure prophylaxis at this time.  She reports feeling normal before and after the event.  She does not have a known cardiac history and does not have a history of syncope.  She is mildly tachycardic on exam and hypertensive.  She has some mild word finding difficulty but otherwise neurologic exam is intact.  She does have significant trauma to the right side of her face but also has bilateral periorbital ecchymosis concern for battle sign.  We will do a CT of the head, face and neck.  Labs pending for further investigation.  Patient given some IV fluids and Tylenol for her headache.  Final Clinical Impression(s) / ED Diagnoses Final diagnoses:  None    Rx / DC Orders ED Discharge Orders    None       Blanchie Dessert, MD 02/06/21 1445

## 2021-02-06 NOTE — ED Notes (Signed)
Informed Cyndi - RN of pt's BP

## 2021-02-06 NOTE — ED Provider Notes (Signed)
Results for orders placed or performed during the hospital encounter of 02/06/21  CBC with Differential/Platelet  Result Value Ref Range   WBC 7.8 4.0 - 10.5 K/uL   RBC 4.76 3.87 - 5.11 MIL/uL   Hemoglobin 13.6 12.0 - 15.0 g/dL   HCT 41.0 36.0 - 46.0 %   MCV 86.1 80.0 - 100.0 fL   MCH 28.6 26.0 - 34.0 pg   MCHC 33.2 30.0 - 36.0 g/dL   RDW 14.6 11.5 - 15.5 %   Platelets 164 150 - 400 K/uL   nRBC 0.0 0.0 - 0.2 %   Neutrophils Relative % 78 %   Neutro Abs 6.0 1.7 - 7.7 K/uL   Lymphocytes Relative 13 %   Lymphs Abs 1.0 0.7 - 4.0 K/uL   Monocytes Relative 8 %   Monocytes Absolute 0.7 0.1 - 1.0 K/uL   Eosinophils Relative 0 %   Eosinophils Absolute 0.0 0.0 - 0.5 K/uL   Basophils Relative 0 %   Basophils Absolute 0.0 0.0 - 0.1 K/uL   Immature Granulocytes 1 %   Abs Immature Granulocytes 0.07 0.00 - 0.07 K/uL  Comprehensive metabolic panel  Result Value Ref Range   Sodium 136 135 - 145 mmol/L   Potassium 3.8 3.5 - 5.1 mmol/L   Chloride 99 98 - 111 mmol/L   CO2 28 22 - 32 mmol/L   Glucose, Bld 101 (H) 70 - 99 mg/dL   BUN 11 8 - 23 mg/dL   Creatinine, Ser 0.66 0.44 - 1.00 mg/dL   Calcium 9.4 8.9 - 10.3 mg/dL   Total Protein 7.2 6.5 - 8.1 g/dL   Albumin 4.3 3.5 - 5.0 g/dL   AST 31 15 - 41 U/L   ALT 17 0 - 44 U/L   Alkaline Phosphatase 49 38 - 126 U/L   Total Bilirubin 0.5 0.3 - 1.2 mg/dL   GFR, Estimated >60 >60 mL/min   Anion gap 9 5 - 15  APTT  Result Value Ref Range   aPTT 29 24 - 36 seconds  Protime-INR  Result Value Ref Range   Prothrombin Time 14.1 11.4 - 15.2 seconds   INR 1.1 0.8 - 1.2  Urinalysis, Routine w reflex microscopic  Result Value Ref Range   Color, Urine COLORLESS (A) YELLOW   APPearance CLEAR CLEAR   Specific Gravity, Urine 1.008 1.005 - 1.030   pH 7.5 5.0 - 8.0   Glucose, UA NEGATIVE NEGATIVE mg/dL   Hgb urine dipstick NEGATIVE NEGATIVE   Bilirubin Urine NEGATIVE NEGATIVE   Ketones, ur NEGATIVE NEGATIVE mg/dL   Protein, ur NEGATIVE NEGATIVE mg/dL    Nitrite NEGATIVE NEGATIVE   Leukocytes,Ua NEGATIVE NEGATIVE    Patient is a 85 year old female who presents after syncopal episode.  It is unclear whether she had a syncopal episode that led to the fall or she syncopized after the fall.  Regardless she has a CT scan that shows small subdural hematomas.  I did consult with Dr. Christella Noa who did not feel that any intervention was needed.  He actually felt comfortable with the patient being discharged home if she was back to baseline and had someone to observe her.  She is more confused than her baseline.  She had CT scan of her facial bones and cervical spine which did not show any acute abnormality.  She is only oriented to person.  She could not tell me the year or the month.  He had reported that she had some increased right-sided weakness on the  way over to the ED.  She has some baseline weakness on that side but he was having to remind her to lift her leg.  I do not appreciate any focal deficits on exam.  She is mildly tachycardic with a heart rate of 110 even after 1 L of IV fluids.  It appears to be a sinus tachycardia on EKG.  Her labs are nonconcerning.  She does not have any suggestions of infection.  Given that she is not completely back to baseline and has some ongoing tachycardia, I feel that is reasonable to admit her for observation.  I spoke with the hospitalist, Dr. Louanne Belton, who will admit the patient to [facility].  CRITICAL CARE Performed by: Malvin Johns Total critical care time: 30 minutes Critical care time was exclusive of separately billable procedures and treating other patients. Critical care was necessary to treat or prevent imminent or life-threatening deterioration. Critical care was time spent personally by me on the following activities: development of treatment plan with patient and/or surrogate as well as nursing, discussions with consultants, evaluation of patient's response to treatment, examination of patient, obtaining  history from patient or surrogate, ordering and performing treatments and interventions, ordering and review of laboratory studies, ordering and review of radiographic studies, pulse oximetry and re-evaluation of patient's condition.    Malvin Johns, MD 02/06/21 [phone]

## 2021-02-06 NOTE — Progress Notes (Signed)
Patient ID: [name] [name], female   DOB: 24-Jul-1936, 85 y.o.   MRN: [id number] BP (!) 164/94   Pulse (!) 114   Temp 98.2 F (36.8 C) (Oral)   Resp 16   Ht 5\' 5"  (1.651 m)   Wt 54.7 kg   SpO2 96%   BMI 20.07 kg/m  Films reviewed. This is a tiny amount of blood, the more rostral read of a subdural I do not agree with. This is non operative. There is no mass effect, midline shift,basal cistern compromise, nor ventricular effacement.  I do not believe she needs to be admitted if she has someone at home with her. No follow up is needed

## 2021-02-07 DIAGNOSIS — I10 Essential (primary) hypertension: Secondary | ICD-10-CM | POA: Diagnosis not present

## 2021-02-07 DIAGNOSIS — S065X9A Traumatic subdural hemorrhage with loss of consciousness of unspecified duration, initial encounter: Secondary | ICD-10-CM | POA: Diagnosis not present

## 2021-02-07 DIAGNOSIS — W19XXXA Unspecified fall, initial encounter: Secondary | ICD-10-CM | POA: Diagnosis not present

## 2021-02-07 LAB — BASIC METABOLIC PANEL
Anion gap: 8 (ref 5–15)
BUN: 6 mg/dL — ABNORMAL LOW (ref 8–23)
CO2: 22 mmol/L (ref 22–32)
Calcium: 8.3 mg/dL — ABNORMAL LOW (ref 8.9–10.3)
Chloride: 104 mmol/L (ref 98–111)
Creatinine, Ser: 0.61 mg/dL (ref 0.44–1.00)
GFR, Estimated: >60 mL/min (ref >60)
Glucose, Bld: 98 mg/dL (ref 70–99)
Potassium: 3.9 mmol/L (ref 3.5–5.1)
Sodium: 134 mmol/L — ABNORMAL LOW (ref 135–145)

## 2021-02-07 LAB — CBC
HCT: 33.5 % — ABNORMAL LOW (ref 36.0–46.0)
Hemoglobin: 11.4 g/dL — ABNORMAL LOW (ref 12.0–15.0)
MCH: 29.2 pg (ref 26.0–34.0)
MCHC: 34 g/dL (ref 30.0–36.0)
MCV: 85.7 fL (ref 80.0–100.0)
Platelets: 137 10*3/uL — ABNORMAL LOW (ref 150–400)
RBC: 3.91 MIL/uL (ref 3.87–5.11)
RDW: 15 % (ref 11.5–15.5)
WBC: 4.3 10*3/uL (ref 4.0–10.5)
nRBC: 0 % (ref 0.0–0.2)

## 2021-02-07 MED ORDER — LORAZEPAM 2 MG/ML IJ SOLN
1.0000 mg | Freq: Once | INTRAMUSCULAR | Status: AC
  Administered 2021-02-07: 1 mg via INTRAVENOUS
  Filled 2021-02-07: qty 1

## 2021-02-07 MED ORDER — LEVETIRACETAM 500 MG PO TABS
500.0000 mg | ORAL_TABLET | Freq: Two times a day (BID) | ORAL | Status: DC
  Administered 2021-02-07 – 2021-02-11 (×8): 500 mg via ORAL
  Filled 2021-02-07 (×8): qty 1

## 2021-02-07 MED ORDER — AMLODIPINE BESYLATE 10 MG PO TABS
10.0000 mg | ORAL_TABLET | Freq: Every day | ORAL | Status: DC
  Administered 2021-02-07 – 2021-02-11 (×5): 10 mg via ORAL
  Filled 2021-02-07 (×5): qty 1

## 2021-02-07 NOTE — Care Management Obs Status (Signed)
[facility] NOTIFICATION   Patient Details  Name: ANIRA SENEGAL MRN: [id number] Date of Birth: February 02, 1936   Medicare Observation Status Notification Given:  Yes    Geralynn Ochs, LCSW 02/07/2021, 3:02 PM

## 2021-02-07 NOTE — NC FL2 (Signed)
[place] MEDICAID FL2 LEVEL OF CARE SCREENING TOOL     IDENTIFICATION  Patient Name: LYNELL GREENHOUSE Birthdate: 20-Jun-1936 Sex: female Admission Date (Current Location): 02/06/2021  [facility] and [place] Number:  Herbalist and Address:  [facility], [place] [address], [place], [place] [address]      Provider Number: [id number]  Attending Physician Name and Address:  Oswald Hillock, MD  Relative Name and Phone Number:       Current Level of Care: Hospital Recommended Level of Care: [place] Prior Approval Number:    Date Approved/Denied:   PASRR Number: [id number]  Discharge Plan: SNF    Current Diagnoses: Patient Active Problem List   Diagnosis Date Noted  . SDH (subdural hematoma) ([place]) 02/06/2021  . Chronic pain 02/06/2021  . Falls 02/06/2021  . Tachycardia   . Hypertension   . Advanced nonexudative age-related macular degeneration of both eyes with subfoveal involvement 07/01/2020  . Degenerative retinal drusen of both eyes 07/01/2020  . Pseudophakia 07/01/2020  . Postoperative state 02/13/2016  . Chest pain with moderate risk of acute coronary syndrome 10/08/2014  . Sepsis syndrome 06/26/2013  . Acute pyelonephritis 06/26/2013  . Migraine 03/28/2013  . Rectocele 02/13/2013  . Sleep apnea 02/16/2012  . Chronic daily headache 01/12/2012  . OSTEOPENIA 10/17/2008  . ESOPHAGEAL STRICTURE 05/02/2008  . DIVERTICULOSIS-COLON 05/02/2008  . DIVERTICULITIS-COLON 05/02/2008  . ABDOMINAL PAIN-EPIGASTRIC 05/02/2008  . GASTRITIS, ACUTE 03/27/2008  . LEG CRAMPS, NOCTURNAL 02/10/2008  . Headache(784.0) 02/10/2008  . OTHER DYSPHAGIA 02/10/2008  . HYPERLIPIDEMIA 02/06/2008  . LACUNAR INFARCTION 02/06/2008  . COLONIC POLYPS 10/13/2007  . Migraine without aura 09/27/2007  . Esophageal reflux 09/27/2007  . HERNIATED DISC 09/27/2007    Orientation RESPIRATION BLADDER Height & Weight     Self,Time,Situation,Place  Normal  Incontinent Weight: 120 lb 9.5 oz (54.7 kg) Height:  5\' 5"  (165.1 cm)  BEHAVIORAL SYMPTOMS/MOOD NEUROLOGICAL BOWEL NUTRITION STATUS      Continent Diet (heart healthy)  AMBULATORY STATUS COMMUNICATION OF NEEDS Skin   Limited Assist Verbally Skin abrasions (laceration, upper right face)                       Personal Care Assistance Level of Assistance  Bathing,Feeding,Dressing Bathing Assistance: Limited assistance Feeding assistance: Limited assistance Dressing Assistance: Limited assistance     Functional Limitations Info  Sight Sight Info: Impaired (blurred vision, right eye)        SPECIAL CARE FACTORS FREQUENCY  PT (By licensed PT),OT (By licensed OT)     PT Frequency: 5x/wk OT Frequency: 5x/wk            Contractures Contractures Info: Not present    Additional Factors Info  Code Status,Allergies Code Status Info: DNR Allergies Info: Isometheptene-dichloral-apap, Oxycodone Hcl           Current Medications (02/07/2021):  This is the current hospital active medication list Current Facility-Administered Medications  Medication Dose Route Frequency Provider Last Rate Last Admin  . acetaminophen (TYLENOL) tablet 650 mg  650 mg Oral Q6H PRN Opyd, Ilene Qua, MD      . fentaNYL (SUBLIMAZE) injection 25 mcg  25 mcg Intravenous Q3H PRN Opyd, Ilene Qua, MD   25 mcg at 02/07/21 0513  . gabapentin (NEURONTIN) capsule 100 mg  100 mg Oral QHS Opyd, Ilene Qua, MD   100 mg at 02/06/21 2100  . HYDROcodone-acetaminophen (NORCO/VICODIN) 5-325 MG per tablet 1 tablet  1 tablet  Oral Q6H PRN Vianne Bulls, MD   1 tablet at 02/06/21 2236  . labetalol (NORMODYNE) injection 10 mg  10 mg Intravenous Q2H PRN Opyd, Ilene Qua, MD      . melatonin tablet 3 mg  3 mg Oral QHS PRN Opyd, Ilene Qua, MD   3 mg at 02/06/21 2101  . ondansetron (ZOFRAN) tablet 4 mg  4 mg Oral Q6H PRN Opyd, Ilene Qua, MD       Or  . ondansetron (ZOFRAN) injection 4 mg  4 mg Intravenous Q6H PRN Opyd, Ilene Qua, MD      . pantoprazole (PROTONIX) EC tablet 40 mg  40 mg Oral Daily Opyd, Ilene Qua, MD      . senna (SENOKOT) tablet 8.6 mg  1 tablet Oral QHS PRN Opyd, Ilene Qua, MD         Discharge Medications: Please see discharge summary for a list of discharge medications.  Relevant Imaging Results:  Relevant Lab Results:   Additional Information SS#: [id number]  Geralynn Ochs, LCSW

## 2021-02-07 NOTE — Progress Notes (Signed)
Pt anxious and trying to get OOB. Pt's HR elevated d/t activity. Dr. Cyd Silence informed.

## 2021-02-07 NOTE — Progress Notes (Signed)
HOSPITAL MEDICINE OVERNIGHT EVENT NOTE    Notified by nursing that patient is exhibiting a yellow MEWS score.  Patient is afebrile yet is confused/agitated likely the cause of the tachycardia and tachypnea prompting administration of as needed benzodiazepine.    No focal evidence of infection otherwise.  Continuing to monitor closely.  [name] Emerald  MD [facility]

## 2021-02-07 NOTE — Plan of Care (Signed)

## 2021-02-07 NOTE — Progress Notes (Signed)
Orthostatic vs obtained later in shift. See flow sheet.

## 2021-02-07 NOTE — Progress Notes (Addendum)
[facility]  PROGRESS NOTE  [name] [name] ACZ:[id number] DOB: 10/26/1936 DOA: 02/06/2021 PCP: Ginger Organ., MD   Brief HPI:   85 year old female with medical history of chronic back pain with radiculopathy, GERD, peptic stricture, falls with subdural hematoma 1-year-ago came to ED with complaints of confusion, scalp, facial ecchymosis after a fall this morning.  CT head showed small subdural hematoma on right side, maxillofacial CT revealed soft tissue swelling without acute osseous abnormality.  Neurosurgery was consulted by ED provider, reviewed the scans and did not feel that admission or further follow-up was needed for tiny SDH.  Patient was found to be more confused than usual and transferred to [facility] for further evaluation and management.    Subjective   Patient seen and examined, continues to be pleasantly confused.   Assessment/Plan:     1. Subdural hematoma-mild-falls at home-patient presents with fall at home, altered mental status which is slowly improving.  Found to have small SDH.  Neurosurgery reviewed the scans do not feel any further intervention for the small SDH.  Though patient was not back to baseline mental status so she was admitted to hospital.  2. History of seizure-patient had seizures last year after she fell on February 07, 2020 incidentally, and had a seizure.  At that time also she was found to have SDH.  She was started on Keppra which continued till May 2021 and after that she stopped taking it.  I will start her back on Keppra 500 mg p.o. twice daily as she has a new SDH after a fall.  3. Chronic back pain with radiculopathy-there was age-indeterminate T7 fracture radiograph in the ED, no new back pain.  Continue gabapentin.  4. Hypertension-blood pressure is elevated, will start amlodipine 10 mg p.o. daily     COVID-19 Labs  No results for input(s): DDIMER, FERRITIN, LDH, CRP in the last 72 hours.  Lab Results  Component Value Date    [place] NEGATIVE 02/06/2021   [place] NEGATIVE 02/09/2020     Scheduled medications:   . gabapentin  100 mg Oral QHS  . pantoprazole  40 mg Oral Daily         CBG: No results for input(s): GLUCAP in the last 168 hours.  SpO2: 95 %    CBC: Recent Labs  Lab 02/06/21 1450 02/07/21 0404  WBC 7.8 4.3  NEUTROABS 6.0  --   HGB 13.6 11.4*  HCT 41.0 33.5*  MCV 86.1 85.7  PLT 164 137*    Basic Metabolic Panel: Recent Labs  Lab 02/06/21 1450 02/07/21 0404  NA 136 134*  K 3.8 3.9  CL 99 104  CO2 28 22  GLUCOSE 101* 98  BUN 11 6*  CREATININE 0.66 0.61  CALCIUM 9.4 8.3*     Liver Function Tests: Recent Labs  Lab 02/06/21 1450  AST 31  ALT 17  ALKPHOS 49  BILITOT 0.5  PROT 7.2  ALBUMIN 4.3     Antibiotics: Anti-infectives (From admission, onward)   None       DVT prophylaxis: SCDs  Code Status: DNR  Family Communication: Discussed with patient's husband on phone   Consultants:  Procedures:      Objective   Vitals:   02/07/21 0750 02/07/21 1131 02/07/21 1156 02/07/21 1503  BP: (!) 150/87 (!) 180/92 (!) 168/88 (!) 187/85  Pulse: 98 100  (!) 104  Resp: 18 16    Temp: 98 F (36.7 C) 98.1 F (36.7 C)  98.1 F (  36.7 C)  TempSrc: Oral Oral  Oral  SpO2: 97% 97%  95%  Weight:      Height:        Intake/Output Summary (Last 24 hours) at 02/07/2021 1738 Last data filed at 02/07/2021 1359 Gross per 24 hour  Intake 691.62 ml  Output 800 ml  Net -108.38 ml    03/09 1901 - 03/11 0700 In: 1457.3 [I.V.:451.6] Out: 300 [Urine:300]  Filed Weights   02/06/21 1345  Weight: 54.7 kg    Physical Examination:    General: Appears in no acute distress  Cardiovascular: S1-S2, regular, no murmur auscultated  Respiratory: Clear to auscultation bilaterally, no wheezing or crackles auscultated  Abdomen: Abdomen is soft, nontender, no organomegaly  Extremities: No edema in the lower extremities   Neurologic: Alert , Oriented  to self and place only.  Lacks insight and judgment   Status is: Inpatient  Dispo: The patient is from: Home              Anticipated d/c is to: Skilled nursing facility              Anticipated d/c date is: 02/11/2021              Patient currently not stable for discharge  Barrier to discharge-encephalopathy        Data Reviewed:   Recent Results (from the past 240 hour(s))  SARS CORONAVIRUS 2 (TAT 6-24 HRS) Nasopharyngeal Nasopharyngeal Swab     Status: None   Collection Time: 02/06/21  3:40 PM   Specimen: Nasopharyngeal Swab  Result Value Ref Range Status   SARS Coronavirus 2 NEGATIVE NEGATIVE Final    Comment: (NOTE) SARS-CoV-2 target nucleic acids are NOT DETECTED.  The SARS-CoV-2 RNA is generally detectable in upper and lower respiratory specimens during the acute phase of infection. Negative results do not preclude SARS-CoV-2 infection, do not rule out co-infections with other pathogens, and should not be used as the sole basis for treatment or other patient management decisions. Negative results must be combined with clinical observations, patient history, and epidemiological information. The expected result is Negative.  Fact Sheet for Patients: SugarRoll.be  Fact Sheet for Healthcare Providers: https://www.woods-mathews.com/  This test is not yet approved or cleared by the [place] FDA and  has been authorized for detection and/or diagnosis of SARS-CoV-2 by FDA under an Emergency Use Authorization (EUA). This EUA will remain  in effect (meaning this test can be used) for the duration of the COVID-19 declaration under Se ction 564(b)(1) of the Act, 21 U.S.C. section 360bbb-3(b)(1), unless the authorization is terminated or revoked sooner.  Performed at [facility], [place] [address] [place], [place] [address]     No results for input(s): LIPASE, AMYLASE in the last 168 hours. No results for input(s):  AMMONIA in the last 168 hours.  Cardiac Enzymes: No results for input(s): CKTOTAL, CKMB, CKMBINDEX, TROPONINI in the last 168 hours. BNP (last 3 results) No results for input(s): BNP in the last 8760 hours.  ProBNP (last 3 results) No results for input(s): PROBNP in the last 8760 hours.  Studies:  DG Chest 2 View  Result Date: 02/06/2021 CLINICAL DATA:  Status post trauma. EXAM: CHEST - 2 VIEW COMPARISON:  July 24, 2017 FINDINGS: Decreased lung volumes are seen which is likely secondary to the degree of patient inspiration. There is no evidence of acute infiltrate, pleural effusion or pneumothorax. The heart size and mediastinal contours are within normal limits. There is moderate  severity calcification of the aortic arch. Chronic compression fracture deformities are seen at the levels of T9 and T12. An additional compression fracture deformity of indeterminate age is noted at the level of T7. Radiopaque surgical clips are seen within the right upper quadrant. IMPRESSION: 1. No acute cardiopulmonary disease. 2. Chronic compression fracture deformities of T9 and T12. 3. Age-indeterminate compression fracture deformity of T7. Electronically Signed   By: Virgina Norfolk M.D.   On: 02/06/2021 15:11   CT Head Wo Contrast  Result Date: 02/06/2021 CLINICAL DATA:  Status post fall. EXAM: CT HEAD WITHOUT CONTRAST TECHNIQUE: Contiguous axial images were obtained from the base of the skull through the vertex without intravenous contrast. COMPARISON:  None. FINDINGS: Brain: There is mild cerebral atrophy with widening of the extra-axial spaces and ventricular dilatation. There are areas of decreased attenuation within the white matter tracts of the supratentorial brain, consistent with microvascular disease changes. A 1.2 cm x 0.5 cm x 1.5 cm and 0.3 cm x 0.3 cm x 0.3 cm areas of acute subdural blood are seen within the frontal lobe on the right (axial CT images 14 and 15, CT series number 2). A similar  appearing 0.5 cm x 0.3 cm x 0.5 cm subdural focus of acute blood is noted within the frontal parietal region on the right (coronal reformatted image 45, CT series number 4). There is no evidence of associated mass effect or midline shift. Vascular: No hyperdense vessel or unexpected calcification. Skull: Normal. Negative for fracture or focal lesion. Sinuses/Orbits: There is moderate severity bilateral ethmoid sinus mucosal thickening. Other: Moderate severity right periorbital, right preseptal and right frontal scalp soft tissue swelling is seen. Moderate severity right facial soft tissue swelling is also noted. IMPRESSION: 1. Small foci of acute subdural blood within the right frontal lobe and right frontal parietal region. MRI correlation is recommended. 2. Right facial, right periorbital, right preseptal and right frontal scalp soft tissue swelling. 3. Mild cerebral atrophy with widening of the extra-axial spaces and ventricular dilatation. 4. Moderate severity bilateral ethmoid sinus disease. Electronically Signed   By: Virgina Norfolk M.D.   On: 02/06/2021 15:25   CT Cervical Spine Wo Contrast  Result Date: 02/06/2021 CLINICAL DATA:  Status post fall. EXAM: CT CERVICAL SPINE WITHOUT CONTRAST TECHNIQUE: Multidetector CT imaging of the cervical spine was performed without intravenous contrast. Multiplanar CT image reconstructions were also generated. COMPARISON:  None. FINDINGS: Alignment: Approximately 1 mm to 2 mm anterolisthesis of the C5 vertebral body is noted on C6. Skull base and vertebrae: No acute fracture. Chronic changes are seen involving the body and tip of the dens. No primary bone lesion or focal pathologic process. Soft tissues and spinal canal: No prevertebral fluid or swelling. No visible canal hematoma. Disc levels: Moderate to marked severity endplate sclerosis is seen at the level of C6-C7. There is marked severity narrowing of the anterior atlantoaxial articulation. Marked severity  intervertebral disc space narrowing is present at the level of C6-C7. Bilateral marked severity multilevel facet joint hypertrophy is noted. Upper chest: Mild biapical scarring is seen. Other: None. IMPRESSION: 1. Marked severity multilevel degenerative changes, most prominent at the level of C6-C7. 2. Approximately 1 mm to 2 mm anterolisthesis of the C5 vertebral body on C6. 3. No evidence of an acute fracture within the cervical spine. Electronically Signed   By: Virgina Norfolk M.D.   On: 02/06/2021 15:32   CT Maxillofacial Wo Contrast  Result Date: 02/06/2021 CLINICAL DATA:  Status post trauma. EXAM:  CT MAXILLOFACIAL WITHOUT CONTRAST TECHNIQUE: Multidetector CT imaging of the maxillofacial structures was performed. Multiplanar CT image reconstructions were also generated. COMPARISON:  None. FINDINGS: Osseous: No fracture or mandibular dislocation. No destructive process. Orbits: Negative. No traumatic or inflammatory finding. Sinuses: There is moderate severity bilateral ethmoid sinus mucosal thickening. Soft tissues: There is mild right facial soft tissue swelling. Moderate severity right periorbital, right preseptal and right frontal scalp soft tissue swelling is also noted. Limited intracranial: Small foci of acute subdural blood are seen within the right frontal and right frontal parietal regions (described in detail on the plain brain CT). IMPRESSION: 1. Small foci of acute subdural blood within the right frontal and right frontal parietal regions (described in detail on the plain brain CT). 2. Right facial, right periorbital, right preseptal and right frontal scalp soft tissue swelling. 3. No evidence of an acute osseous abnormality. Electronically Signed   By: Virgina Norfolk M.D.   On: 02/06/2021 15:27       [place]   [facility] If 7PM-7AM, please contact night-coverage at [email or web address], Office  [phone]   02/07/2021, 5:38 PM  LOS: 0 days

## 2021-02-07 NOTE — TOC Initial Note (Signed)
Transition of Care [facility]) - Initial/Assessment Note    Patient Details  Name: [name] [name] MRN: [id number] Date of Birth: [birth year]  Transition of [facility]) CM/SW Contact:    Geralynn Ochs, LCSW Phone Number: 02/07/2021, 2:58 PM  Clinical Narrative:    CSW alerted by PT this morning that patient will need SNF, preference for [facility]. CSW sent referral to [facility], and [facility] can accept after insurance approval. Patient has regular [employer], so [facility] will obtain insurance authorization. CSW met with patient and spouse at bedside to update them, and they are both in agreement. CSW will need OT note to indicate SNF before [facility] can initiate insurance authorization, awaiting OT evaluation. CSW to follow.               Expected Discharge Plan: Skilled Nursing Facility Barriers to Discharge: Continued Medical Work up,Insurance Authorization   Patient Goals and CMS Choice Patient states their goals for this hospitalization and ongoing recovery are:: to get rehab CMS Medicare.gov Compare Post Acute Care list provided to:: Patient Choice offered to / list presented to : Patient  Expected Discharge Plan and Services Expected Discharge Plan: [place] Choice: [place] Living arrangements for the past 2 months: Apartment                                      Prior Living Arrangements/Services Living arrangements for the past 2 months: Apartment Lives with:: Spouse Patient language and need for interpreter reviewed:: No Do you feel safe going back to the place where you live?: Yes      Need for Family Participation in Patient Care: No (Comment) Care giver support system in place?: No (comment) Current home services: DME Criminal Activity/Legal Involvement Pertinent to Current Situation/Hospitalization: No - Comment as needed  Activities of Daily Living Home Assistive Devices/Equipment: Walker (specify  type) ADL Screening (condition at time of admission) Is the patient deaf or have difficulty hearing?: No Does the patient have difficulty seeing, even when wearing glasses/contacts?: Yes Does the patient have difficulty concentrating, remembering, or making decisions?: Yes Patient able to express need for assistance with ADLs?: Yes Does the patient have difficulty dressing or bathing?: Yes Independently performs ADLs?: No Communication: Independent Grooming: Independent Feeding: Independent Bathing: Needs assistance Toileting: Needs assistance In/Out Bed: Needs assistance Walks in Home: Independent with device (comment) Does the patient have difficulty walking or climbing stairs?: Yes Weakness of Legs: Both Weakness of Arms/Hands: None  Permission Sought/Granted Permission sought to share information with : Facility Retail banker granted to share information with : Yes, Verbal Permission Granted  Share Information with NAME: [name]  Permission granted to share info w AGENCY: SNF  Permission granted to share info w Relationship: Spouse     Emotional Assessment Appearance:: Appears stated age Attitude/Demeanor/Rapport: Engaged Affect (typically observed): Appropriate Orientation: : Oriented to Self,Oriented to Place,Oriented to  Time,Oriented to Situation Alcohol / Substance Use: Not Applicable Psych Involvement: No (comment)  Admission diagnosis:  Tachycardia [R00.0] SDH (subdural hematoma) ([place]) [S06.5X9A] Syncope, unspecified syncope type [R55] Patient Active Problem List   Diagnosis Date Noted  . SDH (subdural hematoma) ([place]) 02/06/2021  . Chronic pain 02/06/2021  . Falls 02/06/2021  . Tachycardia   . Hypertension   . Advanced nonexudative age-related macular degeneration of both eyes with subfoveal involvement 07/01/2020  . Degenerative retinal drusen of  both eyes 07/01/2020  . Pseudophakia 07/01/2020  . Postoperative state  02/13/2016  . Chest pain with moderate risk of acute coronary syndrome 10/08/2014  . Sepsis syndrome 06/26/2013  . Acute pyelonephritis 06/26/2013  . Migraine 03/28/2013  . Rectocele 02/13/2013  . Sleep apnea 02/16/2012  . Chronic daily headache 01/12/2012  . OSTEOPENIA 10/17/2008  . ESOPHAGEAL STRICTURE 05/02/2008  . DIVERTICULOSIS-COLON 05/02/2008  . DIVERTICULITIS-COLON 05/02/2008  . ABDOMINAL PAIN-EPIGASTRIC 05/02/2008  . GASTRITIS, ACUTE 03/27/2008  . LEG CRAMPS, NOCTURNAL 02/10/2008  . Headache(784.0) 02/10/2008  . OTHER DYSPHAGIA 02/10/2008  . HYPERLIPIDEMIA 02/06/2008  . LACUNAR INFARCTION 02/06/2008  . COLONIC POLYPS 10/13/2007  . Migraine without aura 09/27/2007  . Esophageal reflux 09/27/2007  . HERNIATED DISC 09/27/2007   PCP:  Ginger Organ., MD Pharmacy:   [facility] [address], [place] [address] Phone: [phone] Fax: [phone]     Social Determinants of Health (SDOH) Interventions    Readmission Risk Interventions No flowsheet data found.

## 2021-02-07 NOTE — Progress Notes (Signed)
Doctor was paged do to pt pulling off tele, tugging at IV, and inability to stay in bed and was becoming more agitated. One time dose of ativan order. Will continue to monitor. [place], Nursing student

## 2021-02-07 NOTE — Progress Notes (Signed)
Pt AOx4 (able to express where and why she's at [facility], correct date, and her name and birtdate) but w/ moments of confusion.

## 2021-02-07 NOTE — Evaluation (Signed)
Physical Therapy Evaluation Patient Details Name: [name] [name] MRN: [id number] DOB: May 27, 1936 Today's Date: 02/07/2021   History of Present Illness  85 yo female admitted to [facility] on 3/10 with fall, R SDH and mild TBI. CT neck negative for acute fracture, xray shows age indeterminate T7 fracture. PMH includes OA, diverticulosis, DJD, HLD, lacunar infarct, macular degeneration, OP, sleep apnea,  sacrocolpopexy, chronic back pain.  Clinical Impression   Pt presents with generalized weakness, impaired balance, difficulty performing mobility tasks, impaired cognition, impaired gait, and decreased activity tolerance vs baseline. Pt to benefit from acute PT to address deficits. Pt ambulated hallway distance with RW, overall requiring min-mod assist for mobility tasks. Pt explained mild TBI s/s to pt, as well as ways to decrease s/s, and pt receptive but does not remember conversation 10 seconds later, PT called pt's husband and spoke with him about this information. PT recommending ST-SNF for rehabilitation s/p fall and SDH. PT to progress mobility as tolerated, and will continue to follow acutely.      Follow Up Recommendations SNF;Supervision/Assistance - 24 hour    Equipment Recommendations  None recommended by PT    Recommendations for Other Services       Precautions / Restrictions Precautions Precautions: Fall Precaution Comments: concussion - handout adminstered and reviewed; macular degeneration Restrictions Weight Bearing Restrictions: No      Mobility  Bed Mobility Overal bed mobility: Needs Assistance Bed Mobility: Supine to Sit     Supine to sit: Mod assist;HOB elevated     General bed mobility comments: Mod assist for LE translation to EOB, trunk elevation, and scooting to EOB with use of bed pad. Verbal cuing for sequencing, very increased time and effort with stopping/starting to talk    Transfers Overall transfer level: Needs assistance Equipment used: Rolling  walker (2 wheeled) Transfers: Sit to/from Stand Sit to Stand: Min assist;+2 physical assistance         General transfer comment: Min +2 for power up, rise, and steady, verbal cuing for hand placement when rising/sitting.  Ambulation/Gait Ambulation/Gait assistance: Min assist Gait Distance (Feet): 100 Feet Assistive device: Rolling walker (2 wheeled) Gait Pattern/deviations: Step-through pattern;Decreased stride length;Trunk flexed;Staggering right Gait velocity: decr   General Gait Details: Min assist to steady, guide RW away from R. Pt listing R and unable to correct even when bumping into objects on R. Verbal cuing for upright posture, placement in RW.  Stairs            Wheelchair Mobility    Modified Rankin (Stroke Patients Only) Modified Rankin (Stroke Patients Only) Pre-Morbid Rankin Score: Moderate disability Modified Rankin: Moderately severe disability     Balance Overall balance assessment: Needs assistance;History of Falls Sitting-balance support: No upper extremity supported;Feet supported Sitting balance-Leahy Scale: Fair Sitting balance - Comments: able to sit EOB without PT assist Postural control: Posterior lean Standing balance support: Bilateral upper extremity supported;During functional activity Standing balance-Leahy Scale: Poor Standing balance comment: reliant on external assist                             Pertinent Vitals/Pain Pain Assessment: Faces Faces Pain Scale: Hurts little more Pain Location: head Pain Descriptors / Indicators: Headache Pain Intervention(s): Limited activity within patient's tolerance;Monitored during session;Repositioned    Home Living Family/patient expects to be discharged to:: Private residence Living Arrangements: Spouse/significant other Available Help at Discharge: Family Type of Home: Other(Comment) (condo) Home Access: Level entry  Home Layout: One level Home Equipment: Bedside  commode;Walker - 4 wheels;Walker - 2 wheels      Prior Function Level of Independence: Needs assistance   Gait / Transfers Assistance Needed: pt reports she ambulates with rollator, per pt's husband via telephone he encourages pt to use rollator but she does not use it consistently  ADL's / Homemaking Assistance Needed: Pt reports shower assist from husband, uses BSC in shower as seat        Hand Dominance   Dominant Hand: Right    Extremity/Trunk Assessment   Upper Extremity Assessment Upper Extremity Assessment: Defer to OT evaluation    Lower Extremity Assessment Lower Extremity Assessment: Generalized weakness (grossly 4/5 throughout)    Cervical / Trunk Assessment Cervical / Trunk Assessment: Kyphotic  Communication   Communication: No difficulties;Other (comment) (mild slurred speech)  Cognition Arousal/Alertness: Awake/alert Behavior During Therapy: WFL for tasks assessed/performed Overall Cognitive Status: Impaired/Different from baseline Area of Impairment: Orientation;Attention;Memory;Following commands;Safety/judgement;Problem solving;Awareness                 Orientation Level: Disoriented to;Situation Current Attention Level: Sustained Memory: Decreased short-term memory Following Commands: Follows one step commands with increased time Safety/Judgement: Decreased awareness of safety;Decreased awareness of deficits Awareness: Intellectual Problem Solving: Difficulty sequencing;Requires verbal cues;Requires tactile cues General Comments: Pt initially able to report she had a fall, then later does not understand why she has a headache. PT reviewed concussion handout with pt and administered handout, 10 seconds later pt asks "say, what's this paper for?". Pt with R inattention and lacks insight into mobility deficits. Very pleasantly confused, tangential in conversation.      General Comments      Exercises     Assessment/Plan    PT Assessment  Patient needs continued PT services  PT Problem List Decreased strength;Decreased mobility;Decreased safety awareness;Decreased activity tolerance;Decreased balance;Decreased knowledge of use of DME;Pain;Decreased cognition;Decreased coordination;Decreased knowledge of precautions       PT Treatment Interventions DME instruction;Therapeutic activities;Gait training;Therapeutic exercise;Patient/family education;Balance training;Functional mobility training;Neuromuscular re-education    PT Goals (Current goals can be found in the Care Plan section)  Acute Rehab PT Goals Patient Stated Goal: get out of here PT Goal Formulation: With patient Time For Goal Achievement: 02/21/21 Potential to Achieve Goals: Good    Frequency Min 2X/week   Barriers to discharge        Co-evaluation               AM-PAC PT "6 Clicks" Mobility  Outcome Measure Help needed turning from your back to your side while in a flat bed without using bedrails?: A Little Help needed moving from lying on your back to sitting on the side of a flat bed without using bedrails?: A Lot Help needed moving to and from a bed to a chair (including a wheelchair)?: A Lot Help needed standing up from a chair using your arms (e.g., wheelchair or bedside chair)?: A Little Help needed to walk in hospital room?: A Little Help needed climbing 3-5 steps with a railing? : A Lot 6 Click Score: 15    End of Session Equipment Utilized During Treatment: Gait belt Activity Tolerance: Patient limited by fatigue;Patient tolerated treatment well Patient left: in chair;with chair alarm set;with call bell/phone within reach Nurse Communication: Mobility status PT Visit Diagnosis: Other abnormalities of gait and mobility (R26.89);Difficulty in walking, not elsewhere classified (R26.2);Pain Pain - part of body:  (headache)    Time: 7209-4709 PT Time Calculation (min) (ACUTE ONLY): 39  min   Charges:   PT Evaluation $PT Eval Low  Complexity: 1 Low PT Treatments $Gait Training: 8-22 mins $Therapeutic Activity: 8-22 mins       Stacie Glaze, PT Acute Rehabilitation Services Pager [phone]  Office [phone]  Roxine Caddy E Ruffin Pyo 02/07/2021, 11:49 AM

## 2021-02-08 DIAGNOSIS — Z8673 Personal history of transient ischemic attack (TIA), and cerebral infarction without residual deficits: Secondary | ICD-10-CM | POA: Diagnosis not present

## 2021-02-08 DIAGNOSIS — R000 Tachycardia, unspecified: Secondary | ICD-10-CM | POA: Diagnosis present

## 2021-02-08 DIAGNOSIS — M5414 Radiculopathy, thoracic region: Secondary | ICD-10-CM | POA: Diagnosis not present

## 2021-02-08 DIAGNOSIS — E785 Hyperlipidemia, unspecified: Secondary | ICD-10-CM | POA: Diagnosis not present

## 2021-02-08 DIAGNOSIS — Z20822 Contact with and (suspected) exposure to covid-19: Secondary | ICD-10-CM | POA: Diagnosis not present

## 2021-02-08 DIAGNOSIS — Z66 Do not resuscitate: Secondary | ICD-10-CM | POA: Diagnosis not present

## 2021-02-08 DIAGNOSIS — Z87891 Personal history of nicotine dependence: Secondary | ICD-10-CM | POA: Diagnosis not present

## 2021-02-08 DIAGNOSIS — S065X9A Traumatic subdural hemorrhage with loss of consciousness of unspecified duration, initial encounter: Secondary | ICD-10-CM | POA: Diagnosis not present

## 2021-02-08 DIAGNOSIS — I10 Essential (primary) hypertension: Secondary | ICD-10-CM | POA: Diagnosis not present

## 2021-02-08 DIAGNOSIS — W1830XA Fall on same level, unspecified, initial encounter: Secondary | ICD-10-CM | POA: Diagnosis present

## 2021-02-08 DIAGNOSIS — Y92009 Unspecified place in unspecified non-institutional (private) residence as the place of occurrence of the external cause: Secondary | ICD-10-CM | POA: Diagnosis not present

## 2021-02-08 DIAGNOSIS — M810 Age-related osteoporosis without current pathological fracture: Secondary | ICD-10-CM | POA: Diagnosis not present

## 2021-02-08 DIAGNOSIS — Z79899 Other long term (current) drug therapy: Secondary | ICD-10-CM | POA: Diagnosis not present

## 2021-02-08 DIAGNOSIS — H3530 Unspecified macular degeneration: Secondary | ICD-10-CM | POA: Diagnosis not present

## 2021-02-08 DIAGNOSIS — K219 Gastro-esophageal reflux disease without esophagitis: Secondary | ICD-10-CM | POA: Diagnosis not present

## 2021-02-08 DIAGNOSIS — G4730 Sleep apnea, unspecified: Secondary | ICD-10-CM | POA: Diagnosis present

## 2021-02-08 DIAGNOSIS — I6200 Nontraumatic subdural hemorrhage, unspecified: Secondary | ICD-10-CM | POA: Diagnosis present

## 2021-02-08 DIAGNOSIS — G8929 Other chronic pain: Secondary | ICD-10-CM | POA: Diagnosis not present

## 2021-02-08 MED ORDER — ARTIFICIAL TEARS OPHTHALMIC OINT
TOPICAL_OINTMENT | OPHTHALMIC | Status: DC | PRN
  Filled 2021-02-08: qty 3.5

## 2021-02-08 NOTE — Evaluation (Signed)
Occupational Therapy Evaluation Patient Details Name: [name] [name] MRN: [id number] DOB: 1936-08-16 Today's Date: 02/08/2021    History of Present Illness 85 yo female admitted to [facility] on 3/10 with fall, R SDH and mild TBI. CT neck negative for acute fracture, xray shows age indeterminate T7 fracture. PMH includes OA, diverticulosis, DJD, HLD, lacunar infarct, macular degeneration, OP, sleep apnea,  sacrocolpopexy, chronic back pain.   Clinical Impression   Pt PTA: Pt living with spouse and pt requires assist for ADL and mobility with rollator. Pt with short term memory deficits at baseline. Pt currently, very limited due to decreased arousal and decreased ability to follow commands. Pt's family in room so aOTR stopped in and pt has a room at [place] [facility] on Monday. Pt totalA for ADL at this time. Pt modA to EOB and maxA for sit to stand; unable to take any steps with RW. Pt very lethargic this session and very limited arousal. Pt would benefit from continued OT skilled services. OT following acutely.  97% O2 on RA an 82 BPM at rest.    Follow Up Recommendations  SNF    Equipment Recommendations  None recommended by OT    Recommendations for Other Services       Precautions / Restrictions Precautions Precautions: Fall Precaution Comments: concussion Restrictions Weight Bearing Restrictions: No      Mobility Bed Mobility Overal bed mobility: Needs Assistance Bed Mobility: Supine to Sit;Sit to Supine     Supine to sit: Max assist Sit to supine: Max assist   General bed mobility comments: maxA for decreased mobility and poor strength    Transfers Overall transfer level: Needs assistance Equipment used: Rolling walker (2 wheeled);1 person hand held assist Transfers: Sit to/from Stand Sit to Stand: Mod assist         General transfer comment: modA front to front for stepping toward [place];    Balance Overall balance assessment: Needs assistance;History of  Falls Sitting-balance support: No upper extremity supported;Feet supported Sitting balance-Leahy Scale: Fair Sitting balance - Comments: able to sit EOB Postural control: Posterior lean Standing balance support: Bilateral upper extremity supported;During functional activity Standing balance-Leahy Scale: Poor Standing balance comment: reliant on external assist                           ADL either performed or assessed with clinical judgement   ADL Overall ADL's : Needs assistance/impaired Eating/Feeding: Total assistance   Grooming: Total assistance   Upper Body Bathing: Total assistance   Lower Body Bathing: Total assistance   Upper Body Dressing : Total assistance   Lower Body Dressing: Total assistance   Toilet Transfer: Total assistance   Toileting- Clothing Manipulation and Hygiene: Total assistance       Functional mobility during ADLs: Maximal assistance;Cueing for safety;Cueing for sequencing;Rolling walker General ADL Comments: Pt very limited due to decreased arousal and decreased ability to follow commands. Pt's family in room so OTR stopped in and pt has a room at [place] on Monday. Pt totalA for ADL at this time.     Vision Baseline Vision/History: Legally blind Patient Visual Report: No change from baseline Vision Assessment?: Vision impaired- to be further tested in functional context Additional Comments: legally blind     Perception     Praxis      Pertinent Vitals/Pain Pain Assessment: Faces Faces Pain Scale: No hurt Pain Intervention(s): Monitored during session;Repositioned     Hand Dominance Right   Extremity/Trunk Assessment  Upper Extremity Assessment Upper Extremity Assessment: Overall WFL for tasks assessed   Lower Extremity Assessment Lower Extremity Assessment: Generalized weakness   Cervical / Trunk Assessment Cervical / Trunk Assessment: Kyphotic   Communication Communication Communication: HOH;Expressive  difficulties (mumbling speech)   Cognition Arousal/Alertness: Lethargic;Suspect due to medications (sedated due to unsafe behavior this early AM) Behavior During Therapy: Flat affect Overall Cognitive Status: Impaired/Different from baseline Area of Impairment: Orientation;Attention;Memory;Following commands;Safety/judgement;Problem solving;Awareness                 Orientation Level: Disoriented to;Situation Current Attention Level: Sustained Memory: Decreased short-term memory Following Commands: Follows one step commands with increased time Safety/Judgement: Decreased awareness of safety;Decreased awareness of deficits Awareness: Intellectual Problem Solving: Difficulty sequencing;Requires verbal cues;Requires tactile cues General Comments: Pt attempting to climb out of bed often so pt given a sedative early this Am and pt appears very lethargic and eadily aroused, but pt's eyes do not stay open for long   General Comments  95% on RA; 82 BPM; pt verbalizing very little this session and remained lethargic.    Exercises     Shoulder Instructions      Home Living Family/patient expects to be discharged to:: Private residence Living Arrangements: Spouse/significant other Available Help at Discharge: Family Type of Home: Other(Comment) (condo) Home Access: Level entry     Home Layout: One level     Bathroom Shower/Tub: Occupational psychologist: Standard     Home Equipment: Bedside commode;Walker - 4 wheels;Walker - 2 wheels          Prior Functioning/Environment Level of Independence: Needs assistance  Gait / Transfers Assistance Needed: Per spouse, pt uses rollator inconsistently ADL's / Homemaking Assistance Needed: Pt reports shower assist from husband, uses BSC in shower as seat   Comments: Info gathered from spouse in room and PT eval due to pt sedated        OT Problem List: Decreased strength;Decreased activity tolerance;Impaired balance  (sitting and/or standing);Decreased cognition;Decreased safety awareness;Pain;Increased edema;Cardiopulmonary status limiting activity      OT Treatment/Interventions: Self-care/ADL training;Therapeutic exercise;Neuromuscular education;Energy conservation;DME and/or AE instruction;Therapeutic activities;Cognitive remediation/compensation;Patient/family education;Visual/perceptual remediation/compensation;Balance training    OT Goals(Current goals can be found in the care plan section) Acute Rehab OT Goals Patient Stated Goal: unable to state OT Goal Formulation: Patient unable to participate in goal setting Time For Goal Achievement: 02/22/21 Potential to Achieve Goals: Good ADL Goals Pt Will Perform Eating: with set-up;sitting Pt Will Perform Grooming: standing;with min guard assist Additional ADL Goal #1: Pt will increase to minA for bed mobility as precursor for ADL. Additional ADL Goal #2: Pt will follow 1 step commands consistently in order to increase independence.  OT Frequency: Min 2X/week   Barriers to D/C:            Co-evaluation              AM-PAC OT "6 Clicks" Daily Activity     Outcome Measure Help from another person eating meals?: Total Help from another person taking care of personal grooming?: Total Help from another person toileting, which includes using toliet, bedpan, or urinal?: Total Help from another person bathing (including washing, rinsing, drying)?: Total Help from another person to put on and taking off regular upper body clothing?: Total Help from another person to put on and taking off regular lower body clothing?: Total 6 Click Score: 6   End of Session Equipment Utilized During Treatment: Gait belt;Rolling walker Nurse Communication: Mobility status  Activity  Tolerance: Patient limited by lethargy Patient left: in bed;with call bell/phone within reach;with bed alarm set  OT Visit Diagnosis: Unsteadiness on feet (R26.81);Muscle weakness  (generalized) (M62.81);Other symptoms and signs involving cognitive function                Time: 0479-9872 OT Time Calculation (min): 28 min Charges:  OT General Charges $OT Visit: 1 Visit OT Evaluation $OT Eval Moderate Complexity: 1 Mod OT Treatments $Self Care/Home Management : 8-22 mins    Jefferey Pica, OTR/L Acute Rehabilitation Services Pager: [phone] Office: [phone]   Jawad Wiacek C 02/08/2021, 2:12 PM

## 2021-02-08 NOTE — TOC CAGE-AID Note (Signed)
Transition of Care [facility]) - CAGE-AID Screening   Patient Details  Name: [name] [name] MRN: [id number] Date of Birth: [birth year]   Clinical Narrative:  Patient and family deny any current alcohol and drug use.  CAGE-AID Screening:    Have You Ever Felt You Ought to Cut Down on Your Drinking or Drug Use?: No Have People Annoyed You By Critizing Your Drinking Or Drug Use?: No Have You Felt Bad Or Guilty About Your Drinking Or Drug Use?: No Have You Ever Had a Drink or Used Drugs First Thing In The Morning to Steady Your Nerves or to Get Rid of a Hangover?: No CAGE-AID Score: 0  Substance Abuse Education Offered: No

## 2021-02-08 NOTE — Progress Notes (Signed)
PROGRESS NOTE    [name] [name]  VXB:[id number] DOB: May 15, 1936 DOA: 02/06/2021 PCP: Ginger Organ., MD    Brief Narrative:  85 year old female with history of chronic back pain and radiculopathy, GERD, peptic ulcer, following subdural hematoma about a year ago came to the emergency room with confusion, fall at home.  CT head showed a small subdural hematoma on the right side.  Neurosurgery was consulted and recommended conservative management.  Waiting to go to skilled nursing facility.   Assessment & Plan:   Principal Problem:   SDH (subdural hematoma) (HCC) Active Problems:   Chronic daily headache   Sleep apnea   Chronic pain   Falls   Tachycardia   Hypertension   Traumatic subdural hematoma: Seen by neurosurgery, recommended conservative management.  Currently stabilized.  Still with previous history of seizure at the time of head injury, recommended seizure prophylaxis with Keppra that was prescribed. Patient with advanced debility, she will benefit with inpatient therapies with PT OT.  Refer to SNF.  History of seizure: As above.  Starting on Keppra.  Chronic back pain with radiculopathy: Gabapentin continued.  Avoiding narcotics.  Hypertension: Blood pressures stable on amlodipine.   DVT prophylaxis: SCDs Start: 02/06/21 2004   Code Status: DNR Family Communication: Husband at the bedside Disposition Plan: Status is: Observation  The patient remains OBS appropriate and will d/c before 2 midnights.  Dispo: The patient is from: Home              Anticipated d/c is to: SNF              Patient currently is medically stable to d/c.   Difficult to place patient No         Consultants:   Neurosurgery  Procedures:   None  Antimicrobials:   None   Subjective: Patient seen and examined.  Overnight events noted.  She had episode of confusion and was given Ativan x2 and was very sleepy at morning.  I went to examine her with her husband at the  bedside who thinks her mental status has improved much than yesterday and she is more coherent.  Patient herself was complaining of some eye irritation otherwise no particular complaints.  Objective: Vitals:   02/08/21 0449 02/08/21 0921 02/08/21 1151 02/08/21 1245  BP: 124/74 129/64 119/71   Pulse: 97 89 88   Resp:  18 18 18   Temp: 97.6 F (36.4 C) 97.9 F (36.6 C) (!) 97.5 F (36.4 C)   TempSrc: Axillary Oral Oral   SpO2: 96% 96% 95%   Weight:      Height:        Intake/Output Summary (Last 24 hours) at 02/08/2021 1335 Last data filed at 02/08/2021 1100 Gross per 24 hour  Intake 120 ml  Output 1450 ml  Net -1330 ml   Filed Weights   02/06/21 1345  Weight: 54.7 kg    Examination:  General exam: Appears calm and comfortable  Not in any distress.  Patient was slightly confused.  She was alert oriented x2 and her speech was slow and response was slow. Ecchymosis present on right side of the face and periorbital region.  She has some conjunctival hemorrhage. Respiratory system: Clear to auscultation. Respiratory effort normal. Cardiovascular system: S1 & S2 heard, RRR. No JVD, murmurs, rubs, gallops or clicks. No pedal edema. Gastrointestinal system: Abdomen is nondistended, soft and nontender. No organomegaly or masses felt. Normal bowel sounds heard. Central nervous system: Alert and oriented x1-2.  No focal neurological deficits.  Data Reviewed: I have personally reviewed following labs and imaging studies  CBC: Recent Labs  Lab 02/06/21 1450 02/07/21 0404  WBC 7.8 4.3  NEUTROABS 6.0  --   HGB 13.6 11.4*  HCT 41.0 33.5*  MCV 86.1 85.7  PLT 164 240*   Basic Metabolic Panel: Recent Labs  Lab 02/06/21 1450 02/07/21 0404  NA 136 134*  K 3.8 3.9  CL 99 104  CO2 28 22  GLUCOSE 101* 98  BUN 11 6*  CREATININE 0.66 0.61  CALCIUM 9.4 8.3*   GFR: Estimated Creatinine Clearance: 46.8 mL/min (by C-G formula based on SCr of 0.61 mg/dL). Liver Function  Tests: Recent Labs  Lab 02/06/21 1450  AST 31  ALT 17  ALKPHOS 49  BILITOT 0.5  PROT 7.2  ALBUMIN 4.3   No results for input(s): LIPASE, AMYLASE in the last 168 hours. No results for input(s): AMMONIA in the last 168 hours. Coagulation Profile: Recent Labs  Lab 02/06/21 1450  INR 1.1   Cardiac Enzymes: No results for input(s): CKTOTAL, CKMB, CKMBINDEX, TROPONINI in the last 168 hours. BNP (last 3 results) No results for input(s): PROBNP in the last 8760 hours. HbA1C: No results for input(s): HGBA1C in the last 72 hours. CBG: No results for input(s): GLUCAP in the last 168 hours. Lipid Profile: No results for input(s): CHOL, HDL, LDLCALC, TRIG, CHOLHDL, LDLDIRECT in the last 72 hours. Thyroid Function Tests: No results for input(s): TSH, T4TOTAL, FREET4, T3FREE, THYROIDAB in the last 72 hours. Anemia Panel: No results for input(s): VITAMINB12, FOLATE, FERRITIN, TIBC, IRON, RETICCTPCT in the last 72 hours. Sepsis Labs: No results for input(s): PROCALCITON, LATICACIDVEN in the last 168 hours.  Recent Results (from the past 240 hour(s))  SARS CORONAVIRUS 2 (TAT 6-24 HRS) Nasopharyngeal Nasopharyngeal Swab     Status: None   Collection Time: 02/06/21  3:40 PM   Specimen: Nasopharyngeal Swab  Result Value Ref Range Status   SARS Coronavirus 2 NEGATIVE NEGATIVE Final    Comment: (NOTE) SARS-CoV-2 target nucleic acids are NOT DETECTED.  The SARS-CoV-2 RNA is generally detectable in upper and lower respiratory specimens during the acute phase of infection. Negative results do not preclude SARS-CoV-2 infection, do not rule out co-infections with other pathogens, and should not be used as the sole basis for treatment or other patient management decisions. Negative results must be combined with clinical observations, patient history, and epidemiological information. The expected result is Negative.  Fact Sheet for Patients: SugarRoll.be  Fact  Sheet for Healthcare Providers: https://www.woods-mathews.com/  This test is not yet approved or cleared by the [place] FDA and  has been authorized for detection and/or diagnosis of SARS-CoV-2 by FDA under an Emergency Use Authorization (EUA). This EUA will remain  in effect (meaning this test can be used) for the duration of the COVID-19 declaration under Se ction 564(b)(1) of the Act, 21 U.S.C. section 360bbb-3(b)(1), unless the authorization is terminated or revoked sooner.  Performed at [facility], [place] [address], [place], [place] [address]          Radiology Studies: DG Chest 2 View  Result Date: 02/06/2021 CLINICAL DATA:  Status post trauma. EXAM: CHEST - 2 VIEW COMPARISON:  July 24, 2017 FINDINGS: Decreased lung volumes are seen which is likely secondary to the degree of patient inspiration. There is no evidence of acute infiltrate, pleural effusion or pneumothorax. The heart size and mediastinal contours are within normal limits. There is moderate severity  calcification of the aortic arch. Chronic compression fracture deformities are seen at the levels of T9 and T12. An additional compression fracture deformity of indeterminate age is noted at the level of T7. Radiopaque surgical clips are seen within the right upper quadrant. IMPRESSION: 1. No acute cardiopulmonary disease. 2. Chronic compression fracture deformities of T9 and T12. 3. Age-indeterminate compression fracture deformity of T7. Electronically Signed   By: Virgina Norfolk M.D.   On: 02/06/2021 15:11   CT Head Wo Contrast  Result Date: 02/06/2021 CLINICAL DATA:  Status post fall. EXAM: CT HEAD WITHOUT CONTRAST TECHNIQUE: Contiguous axial images were obtained from the base of the skull through the vertex without intravenous contrast. COMPARISON:  None. FINDINGS: Brain: There is mild cerebral atrophy with widening of the extra-axial spaces and ventricular dilatation. There are areas of  decreased attenuation within the white matter tracts of the supratentorial brain, consistent with microvascular disease changes. A 1.2 cm x 0.5 cm x 1.5 cm and 0.3 cm x 0.3 cm x 0.3 cm areas of acute subdural blood are seen within the frontal lobe on the right (axial CT images 14 and 15, CT series number 2). A similar appearing 0.5 cm x 0.3 cm x 0.5 cm subdural focus of acute blood is noted within the frontal parietal region on the right (coronal reformatted image 45, CT series number 4). There is no evidence of associated mass effect or midline shift. Vascular: No hyperdense vessel or unexpected calcification. Skull: Normal. Negative for fracture or focal lesion. Sinuses/Orbits: There is moderate severity bilateral ethmoid sinus mucosal thickening. Other: Moderate severity right periorbital, right preseptal and right frontal scalp soft tissue swelling is seen. Moderate severity right facial soft tissue swelling is also noted. IMPRESSION: 1. Small foci of acute subdural blood within the right frontal lobe and right frontal parietal region. MRI correlation is recommended. 2. Right facial, right periorbital, right preseptal and right frontal scalp soft tissue swelling. 3. Mild cerebral atrophy with widening of the extra-axial spaces and ventricular dilatation. 4. Moderate severity bilateral ethmoid sinus disease. Electronically Signed   By: Virgina Norfolk M.D.   On: 02/06/2021 15:25   CT Cervical Spine Wo Contrast  Result Date: 02/06/2021 CLINICAL DATA:  Status post fall. EXAM: CT CERVICAL SPINE WITHOUT CONTRAST TECHNIQUE: Multidetector CT imaging of the cervical spine was performed without intravenous contrast. Multiplanar CT image reconstructions were also generated. COMPARISON:  None. FINDINGS: Alignment: Approximately 1 mm to 2 mm anterolisthesis of the C5 vertebral body is noted on C6. Skull base and vertebrae: No acute fracture. Chronic changes are seen involving the body and tip of the dens. No primary  bone lesion or focal pathologic process. Soft tissues and spinal canal: No prevertebral fluid or swelling. No visible canal hematoma. Disc levels: Moderate to marked severity endplate sclerosis is seen at the level of C6-C7. There is marked severity narrowing of the anterior atlantoaxial articulation. Marked severity intervertebral disc space narrowing is present at the level of C6-C7. Bilateral marked severity multilevel facet joint hypertrophy is noted. Upper chest: Mild biapical scarring is seen. Other: None. IMPRESSION: 1. Marked severity multilevel degenerative changes, most prominent at the level of C6-C7. 2. Approximately 1 mm to 2 mm anterolisthesis of the C5 vertebral body on C6. 3. No evidence of an acute fracture within the cervical spine. Electronically Signed   By: Virgina Norfolk M.D.   On: 02/06/2021 15:32   CT Maxillofacial Wo Contrast  Result Date: 02/06/2021 CLINICAL DATA:  Status post trauma. EXAM: CT  MAXILLOFACIAL WITHOUT CONTRAST TECHNIQUE: Multidetector CT imaging of the maxillofacial structures was performed. Multiplanar CT image reconstructions were also generated. COMPARISON:  None. FINDINGS: Osseous: No fracture or mandibular dislocation. No destructive process. Orbits: Negative. No traumatic or inflammatory finding. Sinuses: There is moderate severity bilateral ethmoid sinus mucosal thickening. Soft tissues: There is mild right facial soft tissue swelling. Moderate severity right periorbital, right preseptal and right frontal scalp soft tissue swelling is also noted. Limited intracranial: Small foci of acute subdural blood are seen within the right frontal and right frontal parietal regions (described in detail on the plain brain CT). IMPRESSION: 1. Small foci of acute subdural blood within the right frontal and right frontal parietal regions (described in detail on the plain brain CT). 2. Right facial, right periorbital, right preseptal and right frontal scalp soft tissue swelling.  3. No evidence of an acute osseous abnormality. Electronically Signed   By: Virgina Norfolk M.D.   On: 02/06/2021 15:27        Scheduled Meds: . amLODipine  10 mg Oral Daily  . gabapentin  100 mg Oral QHS  . levETIRAcetam  500 mg Oral BID  . pantoprazole  40 mg Oral Daily   Continuous Infusions:   LOS: 0 days    Time spent: 30 minutes    Barb Merino, MD [facility] Pager [phone]

## 2021-02-08 NOTE — Plan of Care (Signed)

## 2021-02-09 LAB — SARS CORONAVIRUS 2 (TAT 6-24 HRS): SARS Coronavirus 2: NEGATIVE

## 2021-02-09 MED ORDER — ARTIFICIAL TEARS OPHTHALMIC OINT
TOPICAL_OINTMENT | OPHTHALMIC | Status: AC | PRN
Start: 2021-02-09 — End: ?

## 2021-02-09 MED ORDER — ACETAMINOPHEN 325 MG PO TABS
650.0000 mg | ORAL_TABLET | Freq: Four times a day (QID) | ORAL | Status: AC | PRN
Start: 2021-02-09 — End: ?

## 2021-02-09 MED ORDER — HYDROCODONE-ACETAMINOPHEN 5-325 MG PO TABS: 1.0000 | ORAL_TABLET | Freq: Four times a day (QID) | ORAL | 0 refills | Status: AC | PRN

## 2021-02-09 MED ORDER — MELATONIN 3 MG PO TABS: 3.0000 mg | ORAL_TABLET | Freq: Every evening | ORAL | 0 refills | Status: AC | PRN

## 2021-02-09 MED ORDER — AMLODIPINE BESYLATE 10 MG PO TABS: 10.0000 mg | ORAL_TABLET | Freq: Every day | ORAL | 0 refills | Status: DC

## 2021-02-09 NOTE — Plan of Care (Signed)

## 2021-02-09 NOTE — Progress Notes (Signed)
PROGRESS NOTE    [name] [name]  KNL:[id number] DOB: 1936-07-23 DOA: 02/06/2021 PCP: Ginger Organ., MD    Brief Narrative:  85 year old female with history of chronic back pain and radiculopathy, GERD, peptic ulcer, following subdural hematoma about a year ago came to the emergency room with confusion, fall at home.  CT head showed a small subdural hematoma on the right side.  Neurosurgery was consulted and recommended conservative management.  Waiting to go to skilled nursing facility.   Assessment & Plan:   Principal Problem:   SDH (subdural hematoma) (HCC) Active Problems:   Chronic daily headache   Sleep apnea   Chronic pain   Falls   Tachycardia   Hypertension   Subdural bleeding (HCC)   Traumatic subdural hematoma: Seen by neurosurgery, recommended conservative management.  Currently stabilized.  Still with previous history of seizure at the time of head injury, recommended seizure prophylaxis with Keppra that was prescribed. Patient with advanced debility, she will benefit with inpatient therapies with PT OT.  Refer to SNF.  History of seizure: As above.  Starting on Keppra.  Chronic back pain with radiculopathy: Gabapentin continued.  Avoiding narcotics.  Hypertension: Blood pressures stable on amlodipine.  Started in the hospital we will continue.   DVT prophylaxis: SCDs Start: 02/06/21 2004   Code Status: DNR Family Communication: Husband at the bedside 3/12. Disposition Plan: Status is: Inpatient.   Dispo: The patient is from: Home              Anticipated d/c is to: SNF              Patient currently is medically stable to d/c.   Difficult to place patient No         Consultants:   Neurosurgery  Procedures:   None  Antimicrobials:   None   Subjective: Patient seen and examined.  No overnight events.  Today she was more awake and alert.  No family at the bedside.  She did not require any additional medications for anxiety or  agitation last night.  Objective: Vitals:   02/09/21 0006 02/09/21 0349 02/09/21 0746 02/09/21 1146  BP: 103/61 117/65 124/71 129/69  Pulse: 87 88 89 100  Resp: 19 18 18 16   Temp: 97.6 F (36.4 C) 97.9 F (36.6 C) (!) 97.4 F (36.3 C) 97.8 F (36.6 C)  TempSrc: Oral Oral Oral Oral  SpO2: 91% 94% 97% 97%  Weight:      Height:        Intake/Output Summary (Last 24 hours) at 02/09/2021 1356 Last data filed at 02/09/2021 0900 Gross per 24 hour  Intake 280 ml  Output 350 ml  Net -70 ml   Filed Weights   02/06/21 1345  Weight: 54.7 kg    Examination: General: Chronically sick looking.  Not in any distress.  Pleasant.  Slight cognitive deficit and delayed response. Ecchymosis around the right eye and face. Cardiovascular: S1-S2 normal.  No added sounds. Respiratory: Bilateral clear.  No added sounds. Gastrointestinal: Soft, nontender.  Bowel sounds present. Ext: Moves all extremities.  Equal power. Neuro: Alert oriented x2-3.  Mood is normal.   Data Reviewed: I have personally reviewed following labs and imaging studies  CBC: Recent Labs  Lab 02/06/21 1450 02/07/21 0404  WBC 7.8 4.3  NEUTROABS 6.0  --   HGB 13.6 11.4*  HCT 41.0 33.5*  MCV 86.1 85.7  PLT 164 790*   Basic Metabolic Panel: Recent Labs  Lab 02/06/21 1450  02/07/21 0404  NA 136 134*  K 3.8 3.9  CL 99 104  CO2 28 22  GLUCOSE 101* 98  BUN 11 6*  CREATININE 0.66 0.61  CALCIUM 9.4 8.3*   GFR: Estimated Creatinine Clearance: 46.8 mL/min (by C-G formula based on SCr of 0.61 mg/dL). Liver Function Tests: Recent Labs  Lab 02/06/21 1450  AST 31  ALT 17  ALKPHOS 49  BILITOT 0.5  PROT 7.2  ALBUMIN 4.3   No results for input(s): LIPASE, AMYLASE in the last 168 hours. No results for input(s): AMMONIA in the last 168 hours. Coagulation Profile: Recent Labs  Lab 02/06/21 1450  INR 1.1   Cardiac Enzymes: No results for input(s): CKTOTAL, CKMB, CKMBINDEX, TROPONINI in the last 168  hours. BNP (last 3 results) No results for input(s): PROBNP in the last 8760 hours. HbA1C: No results for input(s): HGBA1C in the last 72 hours. CBG: No results for input(s): GLUCAP in the last 168 hours. Lipid Profile: No results for input(s): CHOL, HDL, LDLCALC, TRIG, CHOLHDL, LDLDIRECT in the last 72 hours. Thyroid Function Tests: No results for input(s): TSH, T4TOTAL, FREET4, T3FREE, THYROIDAB in the last 72 hours. Anemia Panel: No results for input(s): VITAMINB12, FOLATE, FERRITIN, TIBC, IRON, RETICCTPCT in the last 72 hours. Sepsis Labs: No results for input(s): PROCALCITON, LATICACIDVEN in the last 168 hours.  Recent Results (from the past 240 hour(s))  SARS CORONAVIRUS 2 (TAT 6-24 HRS) Nasopharyngeal Nasopharyngeal Swab     Status: None   Collection Time: 02/06/21  3:40 PM   Specimen: Nasopharyngeal Swab  Result Value Ref Range Status   SARS Coronavirus 2 NEGATIVE NEGATIVE Final    Comment: (NOTE) SARS-CoV-2 target nucleic acids are NOT DETECTED.  The SARS-CoV-2 RNA is generally detectable in upper and lower respiratory specimens during the acute phase of infection. Negative results do not preclude SARS-CoV-2 infection, do not rule out co-infections with other pathogens, and should not be used as the sole basis for treatment or other patient management decisions. Negative results must be combined with clinical observations, patient history, and epidemiological information. The expected result is Negative.  Fact Sheet for Patients: SugarRoll.be  Fact Sheet for Healthcare Providers: https://www.woods-mathews.com/  This test is not yet approved or cleared by the [place] FDA and  has been authorized for detection and/or diagnosis of SARS-CoV-2 by FDA under an Emergency Use Authorization (EUA). This EUA will remain  in effect (meaning this test can be used) for the duration of the COVID-19 declaration under Se ction  564(b)(1) of the Act, 21 U.S.C. section 360bbb-3(b)(1), unless the authorization is terminated or revoked sooner.  Performed at [facility], [place] [address] [place], [place] [address]          Radiology Studies: No results found.      Scheduled Meds: . amLODipine  10 mg Oral Daily  . gabapentin  100 mg Oral QHS  . levETIRAcetam  500 mg Oral BID  . pantoprazole  40 mg Oral Daily   Continuous Infusions:   LOS: 1 day    Time spent: 30 minutes    Barb Merino, MD [facility] Pager [phone]

## 2021-02-09 NOTE — Progress Notes (Signed)
Calm at this time. For a while did attempt to get out of the bed a few times because she said that we were trying to keep her away from her husband and would not allow her to see him.  She was reminded that he had been here with her for several hours and visited with her and her son even came to visit and even fed her.  She was taking off telemetry and when we tried to replace it, she would just pull it off again so I just ensured her safety and allowed her to keep it off a while and she did calm down.  Resting quietly at this time and did refuse to allow me to replace her tele at this time.  Will try again to replace it shortly.

## 2021-02-10 LAB — D-DIMER, QUANTITATIVE: D-Dimer, Quant: 0.44 ug/mL-FEU (ref 0.00–0.50)

## 2021-02-10 LAB — TROPONIN I (HIGH SENSITIVITY): Troponin I (High Sensitivity): 3 ng/L (ref <18)

## 2021-02-10 MED ORDER — NITROGLYCERIN 0.4 MG SL SUBL
0.4000 mg | SUBLINGUAL_TABLET | SUBLINGUAL | Status: DC | PRN
  Administered 2021-02-10 (×3): 0.4 mg via SUBLINGUAL
  Filled 2021-02-10: qty 1

## 2021-02-10 MED ORDER — ALUM & MAG HYDROXIDE-SIMETH 200-200-20 MG/5ML PO SUSP
30.0000 mL | Freq: Once | ORAL | Status: AC
  Administered 2021-02-10: 30 mL via ORAL
  Filled 2021-02-10: qty 30

## 2021-02-10 NOTE — Progress Notes (Addendum)
Robert, CN at accepting facility updated at 2312 about pt not being admitted tonight, but more than likely tomorrow as an admission. The secretary called transport to inform them of the cancelled trip.

## 2021-02-10 NOTE — Progress Notes (Addendum)
Pt w/ c/o acute CP at shift change. EKG obtained along w/ vs. Dr. Cyd Silence updated.

## 2021-02-10 NOTE — TOC Transition Note (Addendum)
Transition of Care [facility]) - CM/SW Discharge Note   Patient Details  Name: [name] [name] MRN: [id number] Date of Birth: 29-Oct-1936  Transition of Care [facility]) CM/SW Contact:  Geralynn Ochs, LCSW Phone Number: 02/10/2021, 3:20 PM   Clinical Narrative:   Nurse to call report to [phone], Room 7014  UPDATE: CSW notified that patient's husband does not have assistance for the patient to transport, requesting PTAR. CSW set up transport request, with patient 18th on the transport list. CSW notified spouse, medical team, and SNF that transport may be a barrier for her leaving today, and may end up transitioning to SNF tomorrow if not picked up.    Final next level of care: Skilled Nursing Facility Barriers to Discharge: Barriers Resolved   Patient Goals and CMS Choice Patient states their goals for this hospitalization and ongoing recovery are:: to get rehab CMS Medicare.gov Compare Post Acute Care list provided to:: Patient Choice offered to / list presented to : Patient  Discharge Placement              Patient chooses bed at: [facility] Patient to be transferred to facility by: Family Name of family member notified: [name] Patient and family notified of of transfer: 02/10/21  Discharge Plan and Services     Post Acute Care Choice: [facility]                               Social Determinants of Health (SDOH) Interventions     Readmission Risk Interventions No flowsheet data found.

## 2021-02-10 NOTE — Progress Notes (Signed)
HOSPITAL MEDICINE OVERNIGHT EVENT NOTE    Notified by nursing the patient suddenly began to develop acute severe left-sided chest pain at rest.  Pain is sharp in quality, severe in intensity.  No associated respiratory distress.  Vital signs are stable.  EKG obtained revealing no dynamic ST segment changes.  Patient has no history of coronary artery disease.  Because of this development of chest discomfort, husband is uncomfortable with patient being discharged this evening and therefore was requested for discharge to be postponed until tomorrow.  Providing a trial dose of GI cocktail and reassessing.  Vernelle Emerald  MD [facility]   ADDENDUM:  Patient did not achieve relief of her chest discomfort with administration of GI cocktail.  Therefore, nitroglycerin was provided which did seem to provide patient some relief of her chest discomfort before it eventually completely subsided.  Repeat EKG again revealed no evidence of dynamic ST segment change.  Because of nitroglycerin causing some symptomatic relief, troponins were cycled throughout the evening and were found to be unremarkable.  D-dimer was also obtained and was also found to be unremarkable.  Patient has since remained chest pain-free throughout the evening.  Sherryll Burger Mabeline Varas

## 2021-02-10 NOTE — Discharge Summary (Addendum)
Physician Discharge Summary  AMEE BOOTHE OJJ:[id number] DOB: 06-Mar-1936 DOA: 02/06/2021  PCP: Ginger Organ., MD  Admit date: 02/06/2021 Discharge date: 02/11/2021  Admitted From: Home Disposition: Skilled nursing facility  Recommendations for Outpatient Follow-up:  1. Follow up with PCP in 1-2 weeks after discharge   Home Health: Not applicable. Equipment/Devices: Not applicable  Discharge Condition: Stable CODE STATUS: DNR Diet recommendation: Low-salt diet  Discharge summary: 85 year old female with history of chronic back pain and radiculopathy, GERD, peptic ulcer, traumatic subdural hematoma about a year ago came to the emergency room with confusion, fall at home.  CT head showed a small subdural hematoma on the right side.  Neurosurgery was consulted and recommended conservative management.    Assessment and plan of care:  #1 . Traumatic subdural hematoma: Seen by neurosurgery, recommended conservative management. Currently stabilized. with previous history of seizure at the time of head injury, recommended seizure prophylaxis with Keppra that was prescribed. Patient with advanced debility, she will benefit with inpatient therapies with PT OT.    Transfer to skilled nursing facility.  #2 .history of seizure: As above.    Was not on seizure medication.  She had used it for 5 months and stopped 6 months ago.  Will resume Keppra.    #3 .chronic back pain with radiculopathy: Gabapentin continued.  Avoiding narcotics.  Minimum narcotic use as needed for pain.  Can use Tylenol.  #4 . hypertension: Blood pressures stable on amlodipine.  Started in the hospital we will continue.  # 5.  Atypical chest pain: Patient had some chest pain overnight.  Acute coronary syndrome ruled out.  Subsequent EKGs and high-sensitivity troponins negative.  D-dimer negative.  Patient tells me in the morning that she had neck pain not the chest pain.  Patient is clinically stable.  She does  have occasional periods of confusion mostly at nights.  Need all-time fall precautions.  Delirium precautions.  Stable to discharge to skilled level of care.  Addendum: 02/11/2021 9 AM: Patient seen and examined.  Discharge summary reviewed.  Medication reconciliation reviewed.  Stable to discharge to skilled level of care.  Discharge Diagnoses:  Principal Problem:   SDH (subdural hematoma) (HCC) Active Problems:   Chronic daily headache   Sleep apnea   Chronic pain   Falls   Tachycardia   Hypertension   Subdural bleeding [facility])    Discharge Instructions  Discharge Instructions    Diet - low sodium heart healthy   Complete by: As directed    Increase activity slowly   Complete by: As directed    No wound care   Complete by: As directed      Allergies as of 02/11/2021      Reactions   Isometheptene-dichloral-apap Nausea Only   Midrin   Oxycodone Hcl Nausea And Vomiting, Other (See Comments)   Pt reports tolerating Vicodin      Medication List    STOP taking these medications   MAGNESIUM PO   tiZANidine 2 MG tablet Commonly known as: ZANAFLEX     TAKE these medications   acetaminophen 325 MG tablet Commonly known as: TYLENOL Take 2 tablets (650 mg total) by mouth every 6 (six) hours as needed for mild pain or headache.   amLODipine 10 MG tablet Commonly known as: NORVASC Take 1 tablet (10 mg total) by mouth daily.   artificial tears Oint ophthalmic ointment Commonly known as: LACRILUBE Place into both eyes every 3 (three) hours as needed for dry eyes.  CENTRUM SILVER ADULT 50+ PO Take 1 tablet by mouth daily.   docusate sodium 100 MG capsule Commonly known as: COLACE Take 100 mg by mouth daily.   gabapentin 100 MG capsule Commonly known as: NEURONTIN Take 100 mg by mouth at bedtime.   HYDROcodone-acetaminophen 5-325 MG tablet Commonly known as: NORCO/VICODIN Take 1 tablet by mouth every 6 (six) hours as needed for up to 3 days for moderate pain.    levETIRAcetam 500 MG tablet Commonly known as: Keppra Take 1 tablet (500 mg total) by mouth 2 (two) times daily.   loratadine 10 MG tablet Commonly known as: CLARITIN Take 10 mg by mouth at bedtime.   melatonin 3 MG Tabs tablet Take 1 tablet (3 mg total) by mouth at bedtime as needed.   pantoprazole 40 MG tablet Commonly known as: PROTONIX Take 40 mg by mouth 2 (two) times daily.   polyethylene glycol 17 g packet Commonly known as: MIRALAX / GLYCOLAX Take 17 g by mouth 2 (two) times daily as needed. What changed:   when to take this  reasons to take this       Contact information for after-discharge care    Destination    [facility] .   Service: Skilled Nursing Contact information: [address] [place] [address] [phone]                 Allergies  Allergen Reactions  . Isometheptene-Dichloral-Apap Nausea Only    Midrin  . Oxycodone Hcl Nausea And Vomiting and Other (See Comments)    Pt reports tolerating Vicodin    Consultations:  Neurosurgery   Procedures/Studies: DG Chest 2 View  Result Date: 02/06/2021 CLINICAL DATA:  Status post trauma. EXAM: CHEST - 2 VIEW COMPARISON:  July 24, 2017 FINDINGS: Decreased lung volumes are seen which is likely secondary to the degree of patient inspiration. There is no evidence of acute infiltrate, pleural effusion or pneumothorax. The heart size and mediastinal contours are within normal limits. There is moderate severity calcification of the aortic arch. Chronic compression fracture deformities are seen at the levels of T9 and T12. An additional compression fracture deformity of indeterminate age is noted at the level of T7. Radiopaque surgical clips are seen within the right upper quadrant. IMPRESSION: 1. No acute cardiopulmonary disease. 2. Chronic compression fracture deformities of T9 and T12. 3. Age-indeterminate compression fracture deformity of T7.  Electronically Signed   By: Virgina Norfolk M.D.   On: 02/06/2021 15:11   CT Head Wo Contrast  Result Date: 02/06/2021 CLINICAL DATA:  Status post fall. EXAM: CT HEAD WITHOUT CONTRAST TECHNIQUE: Contiguous axial images were obtained from the base of the skull through the vertex without intravenous contrast. COMPARISON:  None. FINDINGS: Brain: There is mild cerebral atrophy with widening of the extra-axial spaces and ventricular dilatation. There are areas of decreased attenuation within the white matter tracts of the supratentorial brain, consistent with microvascular disease changes. A 1.2 cm x 0.5 cm x 1.5 cm and 0.3 cm x 0.3 cm x 0.3 cm areas of acute subdural blood are seen within the frontal lobe on the right (axial CT images 14 and 15, CT series number 2). A similar appearing 0.5 cm x 0.3 cm x 0.5 cm subdural focus of acute blood is noted within the frontal parietal region on the right (coronal reformatted image 45, CT series number 4). There is no evidence of associated mass effect or midline shift. Vascular: No hyperdense vessel or  unexpected calcification. Skull: Normal. Negative for fracture or focal lesion. Sinuses/Orbits: There is moderate severity bilateral ethmoid sinus mucosal thickening. Other: Moderate severity right periorbital, right preseptal and right frontal scalp soft tissue swelling is seen. Moderate severity right facial soft tissue swelling is also noted. IMPRESSION: 1. Small foci of acute subdural blood within the right frontal lobe and right frontal parietal region. MRI correlation is recommended. 2. Right facial, right periorbital, right preseptal and right frontal scalp soft tissue swelling. 3. Mild cerebral atrophy with widening of the extra-axial spaces and ventricular dilatation. 4. Moderate severity bilateral ethmoid sinus disease. Electronically Signed   By: Virgina Norfolk M.D.   On: 02/06/2021 15:25   CT Cervical Spine Wo Contrast  Result Date: 02/06/2021 CLINICAL  DATA:  Status post fall. EXAM: CT CERVICAL SPINE WITHOUT CONTRAST TECHNIQUE: Multidetector CT imaging of the cervical spine was performed without intravenous contrast. Multiplanar CT image reconstructions were also generated. COMPARISON:  None. FINDINGS: Alignment: Approximately 1 mm to 2 mm anterolisthesis of the C5 vertebral body is noted on C6. Skull base and vertebrae: No acute fracture. Chronic changes are seen involving the body and tip of the dens. No primary bone lesion or focal pathologic process. Soft tissues and spinal canal: No prevertebral fluid or swelling. No visible canal hematoma. Disc levels: Moderate to marked severity endplate sclerosis is seen at the level of C6-C7. There is marked severity narrowing of the anterior atlantoaxial articulation. Marked severity intervertebral disc space narrowing is present at the level of C6-C7. Bilateral marked severity multilevel facet joint hypertrophy is noted. Upper chest: Mild biapical scarring is seen. Other: None. IMPRESSION: 1. Marked severity multilevel degenerative changes, most prominent at the level of C6-C7. 2. Approximately 1 mm to 2 mm anterolisthesis of the C5 vertebral body on C6. 3. No evidence of an acute fracture within the cervical spine. Electronically Signed   By: Virgina Norfolk M.D.   On: 02/06/2021 15:32   DG SWALLOW FUNC OP MEDICARE SPEECH PATH  Result Date: 01/29/2021 Objective Swallowing Evaluation: Type of Study: MBS-Modified Barium Swallow Study  Patient Details Name: CORENA TILSON MRN: [id number] Date of Birth: February 28, 1936 Today's Date: 01/29/2021 Time: SLP Start Time (ACUTE ONLY): 1318 -SLP Stop Time (ACUTE ONLY): 1415 SLP Time Calculation (min) (ACUTE ONLY): 57 min Past Medical History: Past Medical History: Diagnosis Date . Acid reflux disease  . Anemia   history of . Arthritis   both hands . Blood transfusion without reported diagnosis  . Cataract   bilateral cateracts removed . Common migraine  . Diverticulosis   patient  denies . DJD (degenerative joint disease)  . Esophageal stricture  . Herniated disc  . Hiatal hernia 2009  EGD-Small  . History of colonic polyps  . Hyperlipidemia  . Lacunar infarction ([place])  . Macular degeneration  . Macular degeneration 2016  per husband . Osteoporosis  . Sleep apnea   has c-pap but does not wear . Spinal stress fracture 2019  per husband 09/29/2018 Past Surgical History: Past Surgical History: Procedure Laterality Date . CHOLECYSTECTOMY    4-5 YRS AGO. Marland Kitchen COLONOSCOPY   . ESOPHAGEAL DILATION   . EYE SURGERY    BILATERAL CATARAT . INGUINAL HERNIA REPAIR    rt. Marland Kitchen RECTOCELE REPAIR N/A 02/13/2013  Procedure: POSTERIOR REPAIR (RECTOCELE);  Surgeon: Emily Filbert, MD;  Location: [facility];  Service: Gynecology;  Laterality: N/A; . ROBOTIC ASSISTED LAPAROSCOPIC LYSIS OF ADHESION  02/13/2016  Procedure: ROBOTIC ASSISTED LAPAROSCOPIC LYSIS OF ADHESION;  Surgeon: Princess Bruins,  MD;  Location: [facility];  Service: Gynecology;; . ROBOTIC ASSISTED LAPAROSCOPIC SACROCOLPOPEXY N/A 02/13/2016  Procedure: ROBOTIC ASSISTED LAPAROSCOPIC SACROCOLPOPEXY With Theola Sequin;  Surgeon: Princess Bruins, MD;  Location: [facility];  Service: Gynecology;  Laterality: N/A; . TOTAL ABDOMINAL HYSTERECTOMY   . UPPER GASTROINTESTINAL ENDOSCOPY   HPI: pt is an 85 yo female referred by Dr Brigitte Pulse for OP MBS due to pt requiring heimlich manuever recently after choking on chicken and pt report of dysphagia.  Pt has PMH + for SDH/SAH  - not operable- went to [facility] for rehab x one month in 01/2020, macular degeneration, frequent falls 7-8 times in the last 3 months- 4 times in the last month, dysphagia - dilation 12/2015, DJD/DDDx spine, derpession, GERD, headaches, migraines, OSA on Cpap.  Medication list includes pantoprazole BID, colace, gabapentin, claritifn, miralax, etc.  From her description, she has globus.  Denies issues swallowing pils.  Pt stated she "cleared her throat a lot before coming" for the SLP - suspect she anticipated  potentially being scoped.  Spouse and pt admit pt has had "slurred speech" and dysphagia over the last year - progressive. She admits she does not speak on the phone to people much in the last 3 months to one year due to speech difficulties.  No pneumonias.  Pt sees a pain specialist due to her prior fractures of thoracic vertebra with pain and has benefited from injections.  Per MD note, she also had myalgia which has improved and statin therapy has been avoided due to iincreased risk of worsening weakness/myalgias.  Pt has seen a PT and SLP in the past.  Subjective: pt awake in flouro chair, spouse in room with her Assessment / Plan / Recommendation CHL IP CLINICAL IMPRESSIONS 01/29/2021 Clinical Impression Patient presents with clinical indications of velopharyngeal incompetence - sluggish palatal elevation and her speech subjectively sounds hypernasal.  In addition, pt is dysarthric with lingual weakness on left - but also question some component of bulbar deficit with weakness.  Mild oropharyngeal - cervical esophageal dysphagia present.   Pt with adequate oropharyngeal clearance with no aspiration or significant retention of all consistencies including thin, nectar, pudding and cracker initially.  However, once she was given a barium tablet to swallow with thin, she had difficulty transiting the tablet into pharynx. Puree bolus given to allow oral transit, separated from tablet and spilled into pharynx.  Finally with approximately the 6th thin barium bolus, pt was able to propel tablet into pharynx from oral cavity.  Oral discoordination allowed premature spillage of thin barium *with tablet in oral cavity* into open airway noted allowing aspiration without pt sensation * no protective cough response.    In addition, pt with nasal regurgitation of thin barium that had been retained in pharynx - without sensation.  Suspect component of velopharyngeal incompetence.  Barium tablet noted to momentarily halt at UES -  but was cleared into esophagus with further liquid swallows.  Cued cough did not clear mild aspiration but no aspiration noted when separating solid from liquids.  Advised pt take her medications with pudding *crush, cut in half, etc* if not contraindicated.  Start all intake with liquids to aid oral transiting.  Prefer pt consume water with her meals due to pH neutral status of water making it safest to aspirate.  SLP questions source of pt's hypernasal and dysarthric speech?  Would recommend to consider a neurologist consult to establish source of progressive dysphagia/dysarthria and follow up SLP for dysphagia management.  All education  completed with pt and her spouse using flouro loops and family provided with written instructions.  Of note, following exam, pt given water to consume to clear barium - resulting in pt producing a cough response.  Pt also purports sensation of something in her throat - when throat was clear of barium - uncertain if worse than baseline globus.  Thanks for this consult. SLP Visit Diagnosis Dysphagia, oropharyngeal phase (R13.12);Dysphagia, pharyngoesophageal phase (R13.14) Attention and concentration deficit following -- Frontal lobe and executive function deficit following -- Impact on safety and function Moderate aspiration risk;Mild aspiration risk   No flowsheet data found.  No flowsheet data found. CHL IP DIET RECOMMENDATION 01/29/2021 SLP Diet Recommendations Dysphagia 3 (Mech soft) solids;Thin liquid Liquid Administration via Cup;Straw Medication Administration Whole meds with puree Compensations Slow rate;Small sips/bites;Follow solids with liquid Postural Changes Remain semi-upright after after feeds/meals (Comment);Seated upright at 90 degrees   No flowsheet data found.  No flowsheet data found.  No flowsheet data found.     CHL IP ORAL PHASE 01/29/2021 Oral Phase Impaired Oral - Pudding Teaspoon -- Oral - Pudding Cup -- Oral - Honey Teaspoon -- Oral - Honey Cup -- Oral -  Nectar Teaspoon -- Oral - Nectar Cup WFL Oral - Nectar Straw -- Oral - Thin Teaspoon -- Oral - Thin Cup WFL Oral - Thin Straw WFL Oral - Puree WFL Oral - Mech Soft WFL Oral - Regular -- Oral - Multi-Consistency -- Oral - Pill Reduced posterior propulsion;Decreased bolus cohesion;Delayed oral transit;Premature spillage Oral Phase - Comment Pt did not transit tablet into pharynx with first 3 boluses of thin barium,  nor pudding.  She was able to transit into pharynx with more liquids finally.  CHL IP PHARYNGEAL PHASE 01/29/2021 Pharyngeal Phase Impaired Pharyngeal- Pudding Teaspoon -- Pharyngeal -- Pharyngeal- Pudding Cup -- Pharyngeal -- Pharyngeal- Honey Teaspoon -- Pharyngeal -- Pharyngeal- Honey Cup -- Pharyngeal -- Pharyngeal- Nectar Teaspoon -- Pharyngeal -- Pharyngeal- Nectar Cup [facility] Pharyngeal Material does not enter airway Pharyngeal- Nectar Straw -- Pharyngeal -- Pharyngeal- Thin Teaspoon -- Pharyngeal -- Pharyngeal- Thin Cup Penetration/Apiration after swallow Pharyngeal Material enters airway, passes BELOW cords without attempt by patient to eject out (silent aspiration) Pharyngeal- Thin Straw WFL Pharyngeal -- Pharyngeal- Puree WFL Pharyngeal Material does not enter airway Pharyngeal- Mechanical Soft WFL Pharyngeal Material does not enter airway Pharyngeal- Regular -- Pharyngeal -- Pharyngeal- Multi-consistency -- Pharyngeal -- Pharyngeal- Pill Delayed swallow initiation-pyriform sinuses Pharyngeal -- Pharyngeal Comment pt with nasal regurgitation of thin barium that had been retained in pharynx when she was trying to swallow pill with liquid  CHL IP CERVICAL ESOPHAGEAL PHASE 01/29/2021 Cervical Esophageal Phase Impaired Pudding Teaspoon -- Pudding Cup -- Honey Teaspoon -- Honey Cup -- Nectar Teaspoon -- Nectar Cup [facility] Nectar Straw -- Thin Teaspoon -- Thin Cup WFL Thin Straw WFL Puree WFL Mechanical Soft WFL Regular -- Multi-consistency -- Pill Reduced cricopharyngeal relaxation;Other (Comment) Cervical  Esophageal Comment tablet momentarily halted at UES - clearing into esophagus with further boluses of thin Kathleen Lime, MS [facility] SLP Acute Rehab Services Office [phone] Pager [phone] Macario Golds 01/29/2021, 3:44 PM              CT Maxillofacial Wo Contrast  Result Date: 02/06/2021 CLINICAL DATA:  Status post trauma. EXAM: CT MAXILLOFACIAL WITHOUT CONTRAST TECHNIQUE: Multidetector CT imaging of the maxillofacial structures was performed. Multiplanar CT image reconstructions were also generated. COMPARISON:  None. FINDINGS: Osseous: No fracture or mandibular dislocation. No destructive process. Orbits: Negative. No traumatic  or inflammatory finding. Sinuses: There is moderate severity bilateral ethmoid sinus mucosal thickening. Soft tissues: There is mild right facial soft tissue swelling. Moderate severity right periorbital, right preseptal and right frontal scalp soft tissue swelling is also noted. Limited intracranial: Small foci of acute subdural blood are seen within the right frontal and right frontal parietal regions (described in detail on the plain brain CT). IMPRESSION: 1. Small foci of acute subdural blood within the right frontal and right frontal parietal regions (described in detail on the plain brain CT). 2. Right facial, right periorbital, right preseptal and right frontal scalp soft tissue swelling. 3. No evidence of an acute osseous abnormality. Electronically Signed   By: Virgina Norfolk M.D.   On: 02/06/2021 15:27   (Echo, Carotid, EGD, Colonoscopy, ERCP)    Subjective: Patient seen and examined.  No overnight events.  Had a period of slight impulsiveness last night but she was able to be reoriented.   Discharge Exam: Vitals:   02/11/21 0351 02/11/21 0804  BP: (!) 146/78 133/77  Pulse: (!) 102 (!) 101  Resp: 17 18  Temp: 97.7 F (36.5 C) 97.9 F (36.6 C)  SpO2: 97% 96%   Vitals:   02/10/21 1917 02/11/21 0010 02/11/21 0351 02/11/21 0804  BP: 130/73 117/76 (!)  146/78 133/77  Pulse: 97 98 (!) 102 (!) 101  Resp: 18 18 17 18   Temp: 98.7 F (37.1 C) (!) 97.3 F (36.3 C) 97.7 F (36.5 C) 97.9 F (36.6 C)  TempSrc: Oral Oral Oral Oral  SpO2: 97% 96% 97% 96%  Weight:      Height:        General: Pt is alert, awake, not in acute distress Chronically sick looking.  Not in any distress.  Has significant ecchymosis around the right eye.  No open injury. Patient is alert and oriented x2. Cardiovascular: RRR, S1/S2 +, no rubs, no gallops Respiratory: CTA bilaterally, no wheezing, no rhonchi Abdominal: Soft, NT, ND, bowel sounds + Extremities: no edema, no cyanosis    The results of significant diagnostics from this hospitalization (including imaging, microbiology, ancillary and laboratory) are listed below for reference.     Microbiology: Recent Results (from the past 240 hour(s))  SARS CORONAVIRUS 2 (TAT 6-24 HRS) Nasopharyngeal Nasopharyngeal Swab     Status: None   Collection Time: 02/06/21  3:40 PM   Specimen: Nasopharyngeal Swab  Result Value Ref Range Status   SARS Coronavirus 2 NEGATIVE NEGATIVE Final    Comment: (NOTE) SARS-CoV-2 target nucleic acids are NOT DETECTED.  The SARS-CoV-2 RNA is generally detectable in upper and lower respiratory specimens during the acute phase of infection. Negative results do not preclude SARS-CoV-2 infection, do not rule out co-infections with other pathogens, and should not be used as the sole basis for treatment or other patient management decisions. Negative results must be combined with clinical observations, patient history, and epidemiological information. The expected result is Negative.  Fact Sheet for Patients: SugarRoll.be  Fact Sheet for Healthcare Providers: https://www.woods-mathews.com/  This test is not yet approved or cleared by the [place] FDA and  has been authorized for detection and/or diagnosis of SARS-CoV-2 by FDA under an  Emergency Use Authorization (EUA). This EUA will remain  in effect (meaning this test can be used) for the duration of the COVID-19 declaration under Se ction 564(b)(1) of the Act, 21 U.S.C. section 360bbb-3(b)(1), unless the authorization is terminated or revoked sooner.  Performed at [facility], [place],  [place] [address]   SARS CORONAVIRUS 2 (TAT 6-24 HRS) Nasopharyngeal Nasopharyngeal Swab     Status: None   Collection Time: 02/09/21  2:05 PM   Specimen: Nasopharyngeal Swab  Result Value Ref Range Status   SARS Coronavirus 2 NEGATIVE NEGATIVE Final    Comment: (NOTE) SARS-CoV-2 target nucleic acids are NOT DETECTED.  The SARS-CoV-2 RNA is generally detectable in upper and lower respiratory specimens during the acute phase of infection. Negative results do not preclude SARS-CoV-2 infection, do not rule out co-infections with other pathogens, and should not be used as the sole basis for treatment or other patient management decisions. Negative results must be combined with clinical observations, patient history, and epidemiological information. The expected result is Negative.  Fact Sheet for Patients: SugarRoll.be  Fact Sheet for Healthcare Providers: https://www.woods-mathews.com/  This test is not yet approved or cleared by the [place] FDA and  has been authorized for detection and/or diagnosis of SARS-CoV-2 by FDA under an Emergency Use Authorization (EUA). This EUA will remain  in effect (meaning this test can be used) for the duration of the COVID-19 declaration under Se ction 564(b)(1) of the Act, 21 U.S.C. section 360bbb-3(b)(1), unless the authorization is terminated or revoked sooner.  Performed at [facility], [place] [address] [place], [place] [address]      Labs: BNP (last 3 results) No results for input(s): BNP in the last 8760 hours. Basic Metabolic Panel: Recent Labs  Lab  02/06/21 1450 02/07/21 0404  NA 136 134*  K 3.8 3.9  CL 99 104  CO2 28 22  GLUCOSE 101* 98  BUN 11 6*  CREATININE 0.66 0.61  CALCIUM 9.4 8.3*   Liver Function Tests: Recent Labs  Lab 02/06/21 1450  AST 31  ALT 17  ALKPHOS 49  BILITOT 0.5  PROT 7.2  ALBUMIN 4.3   No results for input(s): LIPASE, AMYLASE in the last 168 hours. No results for input(s): AMMONIA in the last 168 hours. CBC: Recent Labs  Lab 02/06/21 1450 02/07/21 0404  WBC 7.8 4.3  NEUTROABS 6.0  --   HGB 13.6 11.4*  HCT 41.0 33.5*  MCV 86.1 85.7  PLT 164 137*   Cardiac Enzymes: No results for input(s): CKTOTAL, CKMB, CKMBINDEX, TROPONINI in the last 168 hours. BNP: Invalid input(s): POCBNP CBG: No results for input(s): GLUCAP in the last 168 hours. D-Dimer Recent Labs    02/10/21 2242  DDIMER 0.44   Hgb A1c No results for input(s): HGBA1C in the last 72 hours. Lipid Profile No results for input(s): CHOL, HDL, LDLCALC, TRIG, CHOLHDL, LDLDIRECT in the last 72 hours. Thyroid function studies No results for input(s): TSH, T4TOTAL, T3FREE, THYROIDAB in the last 72 hours.  Invalid input(s): FREET3 Anemia work up No results for input(s): VITAMINB12, FOLATE, FERRITIN, TIBC, IRON, RETICCTPCT in the last 72 hours. Urinalysis    Component Value Date/Time   COLORURINE COLORLESS (A) 02/06/2021 1500   APPEARANCEUR CLEAR 02/06/2021 1500   LABSPEC 1.008 02/06/2021 1500   PHURINE 7.5 02/06/2021 1500   GLUCOSEU NEGATIVE 02/06/2021 1500   GLUCOSEU NEGATIVE 09/26/2018 1104   HGBUR NEGATIVE 02/06/2021 1500   BILIRUBINUR NEGATIVE 02/06/2021 1500   KETONESUR NEGATIVE 02/06/2021 1500   PROTEINUR NEGATIVE 02/06/2021 1500   UROBILINOGEN 0.2 09/26/2018 1104   NITRITE NEGATIVE 02/06/2021 1500   LEUKOCYTESUR NEGATIVE 02/06/2021 1500   Sepsis Labs Invalid input(s): PROCALCITONIN,  WBC,  LACTICIDVEN Microbiology Recent Results (from the past 240 hour(s))  SARS CORONAVIRUS 2 (TAT 6-24 HRS)  Nasopharyngeal  Nasopharyngeal Swab     Status: None   Collection Time: 02/06/21  3:40 PM   Specimen: Nasopharyngeal Swab  Result Value Ref Range Status   SARS Coronavirus 2 NEGATIVE NEGATIVE Final    Comment: (NOTE) SARS-CoV-2 target nucleic acids are NOT DETECTED.  The SARS-CoV-2 RNA is generally detectable in upper and lower respiratory specimens during the acute phase of infection. Negative results do not preclude SARS-CoV-2 infection, do not rule out co-infections with other pathogens, and should not be used as the sole basis for treatment or other patient management decisions. Negative results must be combined with clinical observations, patient history, and epidemiological information. The expected result is Negative.  Fact Sheet for Patients: SugarRoll.be  Fact Sheet for Healthcare Providers: https://www.woods-mathews.com/  This test is not yet approved or cleared by the [place] FDA and  has been authorized for detection and/or diagnosis of SARS-CoV-2 by FDA under an Emergency Use Authorization (EUA). This EUA will remain  in effect (meaning this test can be used) for the duration of the COVID-19 declaration under Se ction 564(b)(1) of the Act, 21 U.S.C. section 360bbb-3(b)(1), unless the authorization is terminated or revoked sooner.  Performed at [facility], [place] [address] [place], [place] [address]   SARS CORONAVIRUS 2 (TAT 6-24 HRS) Nasopharyngeal Nasopharyngeal Swab     Status: None   Collection Time: 02/09/21  2:05 PM   Specimen: Nasopharyngeal Swab  Result Value Ref Range Status   SARS Coronavirus 2 NEGATIVE NEGATIVE Final    Comment: (NOTE) SARS-CoV-2 target nucleic acids are NOT DETECTED.  The SARS-CoV-2 RNA is generally detectable in upper and lower respiratory specimens during the acute phase of infection. Negative results do not preclude SARS-CoV-2 infection, do not rule out co-infections with other pathogens, and  should not be used as the sole basis for treatment or other patient management decisions. Negative results must be combined with clinical observations, patient history, and epidemiological information. The expected result is Negative.  Fact Sheet for Patients: SugarRoll.be  Fact Sheet for Healthcare Providers: https://www.woods-mathews.com/  This test is not yet approved or cleared by the [place] FDA and  has been authorized for detection and/or diagnosis of SARS-CoV-2 by FDA under an Emergency Use Authorization (EUA). This EUA will remain  in effect (meaning this test can be used) for the duration of the COVID-19 declaration under Se ction 564(b)(1) of the Act, 21 U.S.C. section 360bbb-3(b)(1), unless the authorization is terminated or revoked sooner.  Performed at [facility], [place] [address], [place], [place] [address]      Time coordinating discharge:  28 minutes  SIGNED:   Barb Merino, MD  [facility] 02/11/2021, 9:46 AM

## 2021-02-10 NOTE — Progress Notes (Signed)
PT Cancellation Note  Patient Details Name: [name] [name] MRN: [id number] DOB: 08/21/1936   Cancelled Treatment:    Reason Eval/Treat Not Completed: Other (comment). Pt currently eating with family assistance. Requested PT return as pt is eating very well at this time. Will follow up as time allows today vs another date. Acute PT to continue.  Willow Ora, PTA, CLT Acute Rehab Services [phone] 02/10/21, 1:27 PM   Willow Ora 02/10/2021, 1:27 PM

## 2021-02-10 NOTE — Plan of Care (Signed)
Mod assist with adls 

## 2021-02-11 DIAGNOSIS — K219 Gastro-esophageal reflux disease without esophagitis: Secondary | ICD-10-CM | POA: Diagnosis not present

## 2021-02-11 DIAGNOSIS — I10 Essential (primary) hypertension: Secondary | ICD-10-CM | POA: Diagnosis not present

## 2021-02-11 DIAGNOSIS — G8929 Other chronic pain: Secondary | ICD-10-CM | POA: Diagnosis not present

## 2021-02-11 DIAGNOSIS — R410 Disorientation, unspecified: Secondary | ICD-10-CM | POA: Diagnosis not present

## 2021-02-11 DIAGNOSIS — S065X9D Traumatic subdural hemorrhage with loss of consciousness of unspecified duration, subsequent encounter: Secondary | ICD-10-CM | POA: Diagnosis not present

## 2021-02-11 DIAGNOSIS — R296 Repeated falls: Secondary | ICD-10-CM | POA: Diagnosis not present

## 2021-02-11 DIAGNOSIS — M549 Dorsalgia, unspecified: Secondary | ICD-10-CM | POA: Diagnosis not present

## 2021-02-11 DIAGNOSIS — G4489 Other headache syndrome: Secondary | ICD-10-CM | POA: Diagnosis not present

## 2021-02-11 DIAGNOSIS — K5904 Chronic idiopathic constipation: Secondary | ICD-10-CM | POA: Diagnosis not present

## 2021-02-11 DIAGNOSIS — Z7401 Bed confinement status: Secondary | ICD-10-CM | POA: Diagnosis not present

## 2021-02-11 DIAGNOSIS — I499 Cardiac arrhythmia, unspecified: Secondary | ICD-10-CM | POA: Diagnosis not present

## 2021-02-11 DIAGNOSIS — K5900 Constipation, unspecified: Secondary | ICD-10-CM | POA: Diagnosis not present

## 2021-02-11 DIAGNOSIS — R079 Chest pain, unspecified: Secondary | ICD-10-CM | POA: Diagnosis not present

## 2021-02-11 DIAGNOSIS — Z9181 History of falling: Secondary | ICD-10-CM | POA: Diagnosis not present

## 2021-02-11 DIAGNOSIS — S065X9A Traumatic subdural hemorrhage with loss of consciousness of unspecified duration, initial encounter: Secondary | ICD-10-CM | POA: Diagnosis not present

## 2021-02-11 DIAGNOSIS — M2550 Pain in unspecified joint: Secondary | ICD-10-CM | POA: Diagnosis not present

## 2021-02-11 LAB — TROPONIN I (HIGH SENSITIVITY): Troponin I (High Sensitivity): 5 ng/L (ref <18)

## 2021-02-11 NOTE — Progress Notes (Signed)
PROGRESS NOTE    [name] [name]  WER:[id number] DOB: 1936-05-13 DOA: 02/06/2021 PCP: Ginger Organ., MD    Brief Narrative:  Patient admitted with traumatic subdural hematoma.  Conservative management.   Assessment & Plan:   Principal Problem:   SDH (subdural hematoma) (HCC) Active Problems:   Chronic daily headache   Sleep apnea   Chronic pain   Falls   Tachycardia   Hypertension   Subdural bleeding (HCC)  Traumatic subdural hematoma: Stable.  Conservative management as per neurosurgery. Hypertension: Stable.  Started on amlodipine. Chest pain: Atypical.  Troponin, EKG and D-dimer negative.  Resolved.  Mostly musculoskeletal.  Patient could not be transferred to skilled nursing rehab yesterday because of transportation issue, it was too late in the night.  Reexamined.  Is stable for discharge.   DVT prophylaxis: SCDs Start: 02/06/21 2004   Code Status: DNR Family Communication: None today Disposition Plan: Status is: Inpatient  Discharging today  Dispo:  Patient From: Home  Planned Disposition: [place]  Medically stable for discharge: No  Difficult to place patient: Resolved        Consultants:   Neurosurgery, curbside  Procedures:   None  Antimicrobials:   None   Subjective: Patient seen and examined.  Overnight events noted.  Chest pain resolved now.  Patient tells me it was neck pain not the chest pain.  She is a poor historian.  Objective: Vitals:   02/10/21 1917 02/11/21 0010 02/11/21 0351 02/11/21 0804  BP: 130/73 117/76 (!) 146/78 133/77  Pulse: 97 98 (!) 102 (!) 101  Resp: 18 18 17 18   Temp: 98.7 F (37.1 C) (!) 97.3 F (36.3 C) 97.7 F (36.5 C) 97.9 F (36.6 C)  TempSrc: Oral Oral Oral Oral  SpO2: 97% 96% 97% 96%  Weight:      Height:       No intake or output data in the 24 hours ending 02/11/21 0948 Filed Weights   02/06/21 1345  Weight: 54.7 kg    Examination:  Patient looks comfortable.   Denies any pain. She has ecchymosis around the right eye. Chest bilateral clear. Heart: S1-S2 normal. Abdomen: Soft and nontender.  Bowel sounds present. Patient is chronically sick looking.  She is alert oriented x1-2.  Not in any distress.   Data Reviewed: I have personally reviewed following labs and imaging studies  CBC: Recent Labs  Lab 02/06/21 1450 02/07/21 0404  WBC 7.8 4.3  NEUTROABS 6.0  --   HGB 13.6 11.4*  HCT 41.0 33.5*  MCV 86.1 85.7  PLT 164 195*   Basic Metabolic Panel: Recent Labs  Lab 02/06/21 1450 02/07/21 0404  NA 136 134*  K 3.8 3.9  CL 99 104  CO2 28 22  GLUCOSE 101* 98  BUN 11 6*  CREATININE 0.66 0.61  CALCIUM 9.4 8.3*   GFR: Estimated Creatinine Clearance: 46.8 mL/min (by C-G formula based on SCr of 0.61 mg/dL). Liver Function Tests: Recent Labs  Lab 02/06/21 1450  AST 31  ALT 17  ALKPHOS 49  BILITOT 0.5  PROT 7.2  ALBUMIN 4.3   No results for input(s): LIPASE, AMYLASE in the last 168 hours. No results for input(s): AMMONIA in the last 168 hours. Coagulation Profile: Recent Labs  Lab 02/06/21 1450  INR 1.1   Cardiac Enzymes: No results for input(s): CKTOTAL, CKMB, CKMBINDEX, TROPONINI in the last 168 hours. BNP (last 3 results) No results for input(s): PROBNP in the last 8760 hours. HbA1C: No  results for input(s): HGBA1C in the last 72 hours. CBG: No results for input(s): GLUCAP in the last 168 hours. Lipid Profile: No results for input(s): CHOL, HDL, LDLCALC, TRIG, CHOLHDL, LDLDIRECT in the last 72 hours. Thyroid Function Tests: No results for input(s): TSH, T4TOTAL, FREET4, T3FREE, THYROIDAB in the last 72 hours. Anemia Panel: No results for input(s): VITAMINB12, FOLATE, FERRITIN, TIBC, IRON, RETICCTPCT in the last 72 hours. Sepsis Labs: No results for input(s): PROCALCITON, LATICACIDVEN in the last 168 hours.  Recent Results (from the past 240 hour(s))  SARS CORONAVIRUS 2 (TAT 6-24 HRS) Nasopharyngeal Nasopharyngeal  Swab     Status: None   Collection Time: 02/06/21  3:40 PM   Specimen: Nasopharyngeal Swab  Result Value Ref Range Status   SARS Coronavirus 2 NEGATIVE NEGATIVE Final    Comment: (NOTE) SARS-CoV-2 target nucleic acids are NOT DETECTED.  The SARS-CoV-2 RNA is generally detectable in upper and lower respiratory specimens during the acute phase of infection. Negative results do not preclude SARS-CoV-2 infection, do not rule out co-infections with other pathogens, and should not be used as the sole basis for treatment or other patient management decisions. Negative results must be combined with clinical observations, patient history, and epidemiological information. The expected result is Negative.  Fact Sheet for Patients: SugarRoll.be  Fact Sheet for Healthcare Providers: https://www.woods-mathews.com/  This test is not yet approved or cleared by the [place] FDA and  has been authorized for detection and/or diagnosis of SARS-CoV-2 by FDA under an Emergency Use Authorization (EUA). This EUA will remain  in effect (meaning this test can be used) for the duration of the COVID-19 declaration under Se ction 564(b)(1) of the Act, 21 U.S.C. section 360bbb-3(b)(1), unless the authorization is terminated or revoked sooner.  Performed at [facility], [place] [address], [place], [place] [address]   SARS CORONAVIRUS 2 (TAT 6-24 HRS) Nasopharyngeal Nasopharyngeal Swab     Status: None   Collection Time: 02/09/21  2:05 PM   Specimen: Nasopharyngeal Swab  Result Value Ref Range Status   SARS Coronavirus 2 NEGATIVE NEGATIVE Final    Comment: (NOTE) SARS-CoV-2 target nucleic acids are NOT DETECTED.  The SARS-CoV-2 RNA is generally detectable in upper and lower respiratory specimens during the acute phase of infection. Negative results do not preclude SARS-CoV-2 infection, do not rule out co-infections with other pathogens, and should not be  used as the sole basis for treatment or other patient management decisions. Negative results must be combined with clinical observations, patient history, and epidemiological information. The expected result is Negative.  Fact Sheet for Patients: SugarRoll.be  Fact Sheet for Healthcare Providers: https://www.woods-mathews.com/  This test is not yet approved or cleared by the [place] FDA and  has been authorized for detection and/or diagnosis of SARS-CoV-2 by FDA under an Emergency Use Authorization (EUA). This EUA will remain  in effect (meaning this test can be used) for the duration of the COVID-19 declaration under Se ction 564(b)(1) of the Act, 21 U.S.C. section 360bbb-3(b)(1), unless the authorization is terminated or revoked sooner.  Performed at [facility], [place] [address] [place], [place] [address]          Radiology Studies: No results found.      Scheduled Meds: . amLODipine  10 mg Oral Daily  . gabapentin  100 mg Oral QHS  . levETIRAcetam  500 mg Oral BID  . pantoprazole  40 mg Oral Daily   Continuous Infusions:   LOS: 3 days  Time spent: 25 minutes    Barb Merino, MD [facility] Pager [phone]

## 2021-02-11 NOTE — TOC Transition Note (Signed)
Transition of Care [facility]) - CM/SW Discharge Note   Patient Details  Name: [name] [name] MRN: [id number] Date of Birth: December 06, 1935  [facility] CM/SW Contact:  Marney Setting, [place] Work Phone Number: 02/11/2021, 10:09 AM   Clinical Narrative:   Nurse to call report to [phone]. Rm [phone]    Final next level of care: Skilled Nursing Facility Barriers to Discharge: No Barriers Identified   Patient Goals and CMS Choice Patient states their goals for this hospitalization and ongoing recovery are:: to get rehab CMS Medicare.gov Compare Post Acute Care list provided to:: Patient Choice offered to / list presented to : Patient  Discharge Placement              Patient chooses bed at: [facility] Patient to be transferred to facility by: [place] Name of family member notified: [name] Patient and family notified of of transfer: 02/11/21  Discharge Plan and Services     Post Acute Care Choice: [place]                               Social Determinants of Health (SDOH) Interventions     Readmission Risk Interventions No flowsheet data found.

## 2021-02-11 NOTE — Care Management Important Message (Signed)
Important Message  Patient Details  Name: [name] [name] MRN: [id number] Date of Birth: 1936/05/20   Medicare Important Message Given:  Yes     Orbie Pyo 02/11/2021, 3:02 PM

## 2021-02-11 NOTE — Plan of Care (Signed)
Max assist with ADLS

## 2021-02-11 NOTE — Plan of Care (Signed)
  Problem: Education: Goal: Understanding of cardiac disease, CV risk reduction, and recovery process will improve Outcome: Progressing Goal: Individualized Educational Video(s) Outcome: Progressing   Problem: Activity: Goal: Ability to tolerate increased activity will improve Outcome: Progressing   Problem: Cardiac: Goal: Ability to achieve and maintain adequate cardiovascular perfusion will improve Outcome: Progressing   Problem: Health Behavior/Discharge Planning: Goal: Ability to safely manage health-related needs after discharge will improve Outcome: Progressing  Until completely ruled out as a diagnosis.

## 2021-02-12 DIAGNOSIS — K5904 Chronic idiopathic constipation: Secondary | ICD-10-CM | POA: Diagnosis not present

## 2021-02-12 DIAGNOSIS — I10 Essential (primary) hypertension: Secondary | ICD-10-CM | POA: Diagnosis not present

## 2021-02-12 DIAGNOSIS — S065X9A Traumatic subdural hemorrhage with loss of consciousness of unspecified duration, initial encounter: Secondary | ICD-10-CM | POA: Diagnosis not present

## 2021-02-12 DIAGNOSIS — R296 Repeated falls: Secondary | ICD-10-CM | POA: Diagnosis not present

## 2021-02-12 DIAGNOSIS — K219 Gastro-esophageal reflux disease without esophagitis: Secondary | ICD-10-CM | POA: Diagnosis not present

## 2021-02-19 DIAGNOSIS — R296 Repeated falls: Secondary | ICD-10-CM | POA: Diagnosis not present

## 2021-02-19 DIAGNOSIS — R079 Chest pain, unspecified: Secondary | ICD-10-CM | POA: Diagnosis not present

## 2021-03-17 ENCOUNTER — Other Ambulatory Visit: Payer: Self-pay

## 2021-03-17 DIAGNOSIS — M549 Dorsalgia, unspecified: Secondary | ICD-10-CM | POA: Diagnosis not present

## 2021-03-17 DIAGNOSIS — G43709 Chronic migraine without aura, not intractable, without status migrainosus: Secondary | ICD-10-CM | POA: Diagnosis not present

## 2021-03-17 DIAGNOSIS — S065X9D Traumatic subdural hemorrhage with loss of consciousness of unspecified duration, subsequent encounter: Secondary | ICD-10-CM | POA: Diagnosis not present

## 2021-03-17 DIAGNOSIS — E785 Hyperlipidemia, unspecified: Secondary | ICD-10-CM | POA: Diagnosis not present

## 2021-03-17 DIAGNOSIS — R1319 Other dysphagia: Secondary | ICD-10-CM | POA: Diagnosis not present

## 2021-03-17 DIAGNOSIS — G8929 Other chronic pain: Secondary | ICD-10-CM | POA: Diagnosis not present

## 2021-03-17 DIAGNOSIS — G629 Polyneuropathy, unspecified: Secondary | ICD-10-CM | POA: Diagnosis not present

## 2021-03-17 DIAGNOSIS — G40909 Epilepsy, unspecified, not intractable, without status epilepticus: Secondary | ICD-10-CM | POA: Diagnosis not present

## 2021-03-17 DIAGNOSIS — M5410 Radiculopathy, site unspecified: Secondary | ICD-10-CM | POA: Diagnosis not present

## 2021-03-17 DIAGNOSIS — I119 Hypertensive heart disease without heart failure: Secondary | ICD-10-CM | POA: Diagnosis not present

## 2021-03-17 NOTE — Patient Outreach (Signed)
[facility]) Care Management  03/17/2021  [name] [name] 1936/08/21 [id number]     Transition of Care Referral  Referral Date: 03/17/2021 Referral Source: [facility] Discharge Report Date of Discharge: 415/2022 Facility: [facility]   Referral received. Transition of care calls being completed via EMMI-automated calls.     Plan: RN CM will close case at this time.    Enzo Montgomery, RN,BSN,CCM [place] Management Telephonic Care Management Coordinator Direct Phone: [phone] Toll Free: [phone] Fax: [phone]

## 2021-03-19 DIAGNOSIS — M5410 Radiculopathy, site unspecified: Secondary | ICD-10-CM | POA: Diagnosis not present

## 2021-03-19 DIAGNOSIS — G40909 Epilepsy, unspecified, not intractable, without status epilepticus: Secondary | ICD-10-CM | POA: Diagnosis not present

## 2021-03-19 DIAGNOSIS — I119 Hypertensive heart disease without heart failure: Secondary | ICD-10-CM | POA: Diagnosis not present

## 2021-03-19 DIAGNOSIS — G43709 Chronic migraine without aura, not intractable, without status migrainosus: Secondary | ICD-10-CM | POA: Diagnosis not present

## 2021-03-19 DIAGNOSIS — S065X9D Traumatic subdural hemorrhage with loss of consciousness of unspecified duration, subsequent encounter: Secondary | ICD-10-CM | POA: Diagnosis not present

## 2021-03-19 DIAGNOSIS — R1319 Other dysphagia: Secondary | ICD-10-CM | POA: Diagnosis not present

## 2021-03-19 DIAGNOSIS — G8929 Other chronic pain: Secondary | ICD-10-CM | POA: Diagnosis not present

## 2021-03-19 DIAGNOSIS — M549 Dorsalgia, unspecified: Secondary | ICD-10-CM | POA: Diagnosis not present

## 2021-03-19 DIAGNOSIS — G629 Polyneuropathy, unspecified: Secondary | ICD-10-CM | POA: Diagnosis not present

## 2021-03-20 DIAGNOSIS — R1319 Other dysphagia: Secondary | ICD-10-CM | POA: Diagnosis not present

## 2021-03-20 DIAGNOSIS — S065X9D Traumatic subdural hemorrhage with loss of consciousness of unspecified duration, subsequent encounter: Secondary | ICD-10-CM | POA: Diagnosis not present

## 2021-03-20 DIAGNOSIS — S065X9A Traumatic subdural hemorrhage with loss of consciousness of unspecified duration, initial encounter: Secondary | ICD-10-CM | POA: Diagnosis not present

## 2021-03-20 DIAGNOSIS — M810 Age-related osteoporosis without current pathological fracture: Secondary | ICD-10-CM | POA: Diagnosis not present

## 2021-03-20 DIAGNOSIS — R5383 Other fatigue: Secondary | ICD-10-CM | POA: Diagnosis not present

## 2021-03-20 DIAGNOSIS — G43709 Chronic migraine without aura, not intractable, without status migrainosus: Secondary | ICD-10-CM | POA: Diagnosis not present

## 2021-03-20 DIAGNOSIS — M549 Dorsalgia, unspecified: Secondary | ICD-10-CM | POA: Diagnosis not present

## 2021-03-20 DIAGNOSIS — G40909 Epilepsy, unspecified, not intractable, without status epilepticus: Secondary | ICD-10-CM | POA: Diagnosis not present

## 2021-03-20 DIAGNOSIS — M5410 Radiculopathy, site unspecified: Secondary | ICD-10-CM | POA: Diagnosis not present

## 2021-03-20 DIAGNOSIS — R531 Weakness: Secondary | ICD-10-CM | POA: Diagnosis not present

## 2021-03-20 DIAGNOSIS — I119 Hypertensive heart disease without heart failure: Secondary | ICD-10-CM | POA: Diagnosis not present

## 2021-03-20 DIAGNOSIS — G8929 Other chronic pain: Secondary | ICD-10-CM | POA: Diagnosis not present

## 2021-03-20 DIAGNOSIS — I10 Essential (primary) hypertension: Secondary | ICD-10-CM | POA: Diagnosis not present

## 2021-03-20 DIAGNOSIS — G629 Polyneuropathy, unspecified: Secondary | ICD-10-CM | POA: Diagnosis not present

## 2021-03-21 DIAGNOSIS — M549 Dorsalgia, unspecified: Secondary | ICD-10-CM | POA: Diagnosis not present

## 2021-03-21 DIAGNOSIS — G40909 Epilepsy, unspecified, not intractable, without status epilepticus: Secondary | ICD-10-CM | POA: Diagnosis not present

## 2021-03-21 DIAGNOSIS — G629 Polyneuropathy, unspecified: Secondary | ICD-10-CM | POA: Diagnosis not present

## 2021-03-21 DIAGNOSIS — G8929 Other chronic pain: Secondary | ICD-10-CM | POA: Diagnosis not present

## 2021-03-21 DIAGNOSIS — R1319 Other dysphagia: Secondary | ICD-10-CM | POA: Diagnosis not present

## 2021-03-21 DIAGNOSIS — S065X9D Traumatic subdural hemorrhage with loss of consciousness of unspecified duration, subsequent encounter: Secondary | ICD-10-CM | POA: Diagnosis not present

## 2021-03-21 DIAGNOSIS — I119 Hypertensive heart disease without heart failure: Secondary | ICD-10-CM | POA: Diagnosis not present

## 2021-03-21 DIAGNOSIS — M5410 Radiculopathy, site unspecified: Secondary | ICD-10-CM | POA: Diagnosis not present

## 2021-03-21 DIAGNOSIS — G43709 Chronic migraine without aura, not intractable, without status migrainosus: Secondary | ICD-10-CM | POA: Diagnosis not present

## 2021-03-22 DIAGNOSIS — R296 Repeated falls: Secondary | ICD-10-CM | POA: Diagnosis not present

## 2021-03-22 DIAGNOSIS — R079 Chest pain, unspecified: Secondary | ICD-10-CM | POA: Diagnosis not present

## 2021-03-24 DIAGNOSIS — G629 Polyneuropathy, unspecified: Secondary | ICD-10-CM | POA: Diagnosis not present

## 2021-03-24 DIAGNOSIS — I119 Hypertensive heart disease without heart failure: Secondary | ICD-10-CM | POA: Diagnosis not present

## 2021-03-24 DIAGNOSIS — G43709 Chronic migraine without aura, not intractable, without status migrainosus: Secondary | ICD-10-CM | POA: Diagnosis not present

## 2021-03-24 DIAGNOSIS — M5410 Radiculopathy, site unspecified: Secondary | ICD-10-CM | POA: Diagnosis not present

## 2021-03-24 DIAGNOSIS — M549 Dorsalgia, unspecified: Secondary | ICD-10-CM | POA: Diagnosis not present

## 2021-03-24 DIAGNOSIS — S065X9D Traumatic subdural hemorrhage with loss of consciousness of unspecified duration, subsequent encounter: Secondary | ICD-10-CM | POA: Diagnosis not present

## 2021-03-24 DIAGNOSIS — G8929 Other chronic pain: Secondary | ICD-10-CM | POA: Diagnosis not present

## 2021-03-24 DIAGNOSIS — R1319 Other dysphagia: Secondary | ICD-10-CM | POA: Diagnosis not present

## 2021-03-24 DIAGNOSIS — G40909 Epilepsy, unspecified, not intractable, without status epilepticus: Secondary | ICD-10-CM | POA: Diagnosis not present

## 2021-03-25 DIAGNOSIS — G8929 Other chronic pain: Secondary | ICD-10-CM | POA: Diagnosis not present

## 2021-03-25 DIAGNOSIS — S065X9D Traumatic subdural hemorrhage with loss of consciousness of unspecified duration, subsequent encounter: Secondary | ICD-10-CM | POA: Diagnosis not present

## 2021-03-25 DIAGNOSIS — R1319 Other dysphagia: Secondary | ICD-10-CM | POA: Diagnosis not present

## 2021-03-25 DIAGNOSIS — I119 Hypertensive heart disease without heart failure: Secondary | ICD-10-CM | POA: Diagnosis not present

## 2021-03-25 DIAGNOSIS — G43709 Chronic migraine without aura, not intractable, without status migrainosus: Secondary | ICD-10-CM | POA: Diagnosis not present

## 2021-03-25 DIAGNOSIS — G40909 Epilepsy, unspecified, not intractable, without status epilepticus: Secondary | ICD-10-CM | POA: Diagnosis not present

## 2021-03-25 DIAGNOSIS — M549 Dorsalgia, unspecified: Secondary | ICD-10-CM | POA: Diagnosis not present

## 2021-03-25 DIAGNOSIS — M5410 Radiculopathy, site unspecified: Secondary | ICD-10-CM | POA: Diagnosis not present

## 2021-03-25 DIAGNOSIS — G629 Polyneuropathy, unspecified: Secondary | ICD-10-CM | POA: Diagnosis not present

## 2021-03-26 DIAGNOSIS — G43709 Chronic migraine without aura, not intractable, without status migrainosus: Secondary | ICD-10-CM | POA: Diagnosis not present

## 2021-03-26 DIAGNOSIS — M5410 Radiculopathy, site unspecified: Secondary | ICD-10-CM | POA: Diagnosis not present

## 2021-03-26 DIAGNOSIS — G8929 Other chronic pain: Secondary | ICD-10-CM | POA: Diagnosis not present

## 2021-03-26 DIAGNOSIS — M549 Dorsalgia, unspecified: Secondary | ICD-10-CM | POA: Diagnosis not present

## 2021-03-26 DIAGNOSIS — R1319 Other dysphagia: Secondary | ICD-10-CM | POA: Diagnosis not present

## 2021-03-26 DIAGNOSIS — G629 Polyneuropathy, unspecified: Secondary | ICD-10-CM | POA: Diagnosis not present

## 2021-03-26 DIAGNOSIS — G40909 Epilepsy, unspecified, not intractable, without status epilepticus: Secondary | ICD-10-CM | POA: Diagnosis not present

## 2021-03-26 DIAGNOSIS — S065X9D Traumatic subdural hemorrhage with loss of consciousness of unspecified duration, subsequent encounter: Secondary | ICD-10-CM | POA: Diagnosis not present

## 2021-03-26 DIAGNOSIS — I119 Hypertensive heart disease without heart failure: Secondary | ICD-10-CM | POA: Diagnosis not present

## 2021-03-27 DIAGNOSIS — R1319 Other dysphagia: Secondary | ICD-10-CM | POA: Diagnosis not present

## 2021-03-27 DIAGNOSIS — G629 Polyneuropathy, unspecified: Secondary | ICD-10-CM | POA: Diagnosis not present

## 2021-03-27 DIAGNOSIS — G8929 Other chronic pain: Secondary | ICD-10-CM | POA: Diagnosis not present

## 2021-03-27 DIAGNOSIS — G43709 Chronic migraine without aura, not intractable, without status migrainosus: Secondary | ICD-10-CM | POA: Diagnosis not present

## 2021-03-27 DIAGNOSIS — M549 Dorsalgia, unspecified: Secondary | ICD-10-CM | POA: Diagnosis not present

## 2021-03-27 DIAGNOSIS — M5410 Radiculopathy, site unspecified: Secondary | ICD-10-CM | POA: Diagnosis not present

## 2021-03-27 DIAGNOSIS — I119 Hypertensive heart disease without heart failure: Secondary | ICD-10-CM | POA: Diagnosis not present

## 2021-03-27 DIAGNOSIS — S065X9D Traumatic subdural hemorrhage with loss of consciousness of unspecified duration, subsequent encounter: Secondary | ICD-10-CM | POA: Diagnosis not present

## 2021-03-27 DIAGNOSIS — G40909 Epilepsy, unspecified, not intractable, without status epilepticus: Secondary | ICD-10-CM | POA: Diagnosis not present

## 2021-03-28 DIAGNOSIS — G40909 Epilepsy, unspecified, not intractable, without status epilepticus: Secondary | ICD-10-CM | POA: Diagnosis not present

## 2021-03-28 DIAGNOSIS — M5410 Radiculopathy, site unspecified: Secondary | ICD-10-CM | POA: Diagnosis not present

## 2021-03-28 DIAGNOSIS — G8929 Other chronic pain: Secondary | ICD-10-CM | POA: Diagnosis not present

## 2021-03-28 DIAGNOSIS — G43709 Chronic migraine without aura, not intractable, without status migrainosus: Secondary | ICD-10-CM | POA: Diagnosis not present

## 2021-03-28 DIAGNOSIS — G629 Polyneuropathy, unspecified: Secondary | ICD-10-CM | POA: Diagnosis not present

## 2021-03-28 DIAGNOSIS — I119 Hypertensive heart disease without heart failure: Secondary | ICD-10-CM | POA: Diagnosis not present

## 2021-03-28 DIAGNOSIS — S065X9D Traumatic subdural hemorrhage with loss of consciousness of unspecified duration, subsequent encounter: Secondary | ICD-10-CM | POA: Diagnosis not present

## 2021-03-28 DIAGNOSIS — M549 Dorsalgia, unspecified: Secondary | ICD-10-CM | POA: Diagnosis not present

## 2021-03-28 DIAGNOSIS — R1319 Other dysphagia: Secondary | ICD-10-CM | POA: Diagnosis not present

## 2021-03-31 DIAGNOSIS — G43709 Chronic migraine without aura, not intractable, without status migrainosus: Secondary | ICD-10-CM | POA: Diagnosis not present

## 2021-03-31 DIAGNOSIS — I119 Hypertensive heart disease without heart failure: Secondary | ICD-10-CM | POA: Diagnosis not present

## 2021-03-31 DIAGNOSIS — M5410 Radiculopathy, site unspecified: Secondary | ICD-10-CM | POA: Diagnosis not present

## 2021-03-31 DIAGNOSIS — G629 Polyneuropathy, unspecified: Secondary | ICD-10-CM | POA: Diagnosis not present

## 2021-03-31 DIAGNOSIS — S065X9D Traumatic subdural hemorrhage with loss of consciousness of unspecified duration, subsequent encounter: Secondary | ICD-10-CM | POA: Diagnosis not present

## 2021-03-31 DIAGNOSIS — G8929 Other chronic pain: Secondary | ICD-10-CM | POA: Diagnosis not present

## 2021-03-31 DIAGNOSIS — G40909 Epilepsy, unspecified, not intractable, without status epilepticus: Secondary | ICD-10-CM | POA: Diagnosis not present

## 2021-03-31 DIAGNOSIS — M549 Dorsalgia, unspecified: Secondary | ICD-10-CM | POA: Diagnosis not present

## 2021-03-31 DIAGNOSIS — R1319 Other dysphagia: Secondary | ICD-10-CM | POA: Diagnosis not present

## 2021-04-01 DIAGNOSIS — R1319 Other dysphagia: Secondary | ICD-10-CM | POA: Diagnosis not present

## 2021-04-01 DIAGNOSIS — G40909 Epilepsy, unspecified, not intractable, without status epilepticus: Secondary | ICD-10-CM | POA: Diagnosis not present

## 2021-04-01 DIAGNOSIS — I119 Hypertensive heart disease without heart failure: Secondary | ICD-10-CM | POA: Diagnosis not present

## 2021-04-01 DIAGNOSIS — G43709 Chronic migraine without aura, not intractable, without status migrainosus: Secondary | ICD-10-CM | POA: Diagnosis not present

## 2021-04-01 DIAGNOSIS — M5410 Radiculopathy, site unspecified: Secondary | ICD-10-CM | POA: Diagnosis not present

## 2021-04-01 DIAGNOSIS — M549 Dorsalgia, unspecified: Secondary | ICD-10-CM | POA: Diagnosis not present

## 2021-04-01 DIAGNOSIS — S065X9D Traumatic subdural hemorrhage with loss of consciousness of unspecified duration, subsequent encounter: Secondary | ICD-10-CM | POA: Diagnosis not present

## 2021-04-01 DIAGNOSIS — G8929 Other chronic pain: Secondary | ICD-10-CM | POA: Diagnosis not present

## 2021-04-01 DIAGNOSIS — G629 Polyneuropathy, unspecified: Secondary | ICD-10-CM | POA: Diagnosis not present

## 2021-04-03 DIAGNOSIS — G40909 Epilepsy, unspecified, not intractable, without status epilepticus: Secondary | ICD-10-CM | POA: Diagnosis not present

## 2021-04-03 DIAGNOSIS — R1319 Other dysphagia: Secondary | ICD-10-CM | POA: Diagnosis not present

## 2021-04-03 DIAGNOSIS — I119 Hypertensive heart disease without heart failure: Secondary | ICD-10-CM | POA: Diagnosis not present

## 2021-04-03 DIAGNOSIS — G8929 Other chronic pain: Secondary | ICD-10-CM | POA: Diagnosis not present

## 2021-04-03 DIAGNOSIS — M5410 Radiculopathy, site unspecified: Secondary | ICD-10-CM | POA: Diagnosis not present

## 2021-04-03 DIAGNOSIS — G43709 Chronic migraine without aura, not intractable, without status migrainosus: Secondary | ICD-10-CM | POA: Diagnosis not present

## 2021-04-03 DIAGNOSIS — S065X9D Traumatic subdural hemorrhage with loss of consciousness of unspecified duration, subsequent encounter: Secondary | ICD-10-CM | POA: Diagnosis not present

## 2021-04-03 DIAGNOSIS — G629 Polyneuropathy, unspecified: Secondary | ICD-10-CM | POA: Diagnosis not present

## 2021-04-03 DIAGNOSIS — M549 Dorsalgia, unspecified: Secondary | ICD-10-CM | POA: Diagnosis not present

## 2021-04-04 DIAGNOSIS — G629 Polyneuropathy, unspecified: Secondary | ICD-10-CM | POA: Diagnosis not present

## 2021-04-04 DIAGNOSIS — S065X9D Traumatic subdural hemorrhage with loss of consciousness of unspecified duration, subsequent encounter: Secondary | ICD-10-CM | POA: Diagnosis not present

## 2021-04-04 DIAGNOSIS — R1319 Other dysphagia: Secondary | ICD-10-CM | POA: Diagnosis not present

## 2021-04-04 DIAGNOSIS — G40909 Epilepsy, unspecified, not intractable, without status epilepticus: Secondary | ICD-10-CM | POA: Diagnosis not present

## 2021-04-04 DIAGNOSIS — G8929 Other chronic pain: Secondary | ICD-10-CM | POA: Diagnosis not present

## 2021-04-04 DIAGNOSIS — I119 Hypertensive heart disease without heart failure: Secondary | ICD-10-CM | POA: Diagnosis not present

## 2021-04-04 DIAGNOSIS — G43709 Chronic migraine without aura, not intractable, without status migrainosus: Secondary | ICD-10-CM | POA: Diagnosis not present

## 2021-04-04 DIAGNOSIS — M5410 Radiculopathy, site unspecified: Secondary | ICD-10-CM | POA: Diagnosis not present

## 2021-04-04 DIAGNOSIS — M549 Dorsalgia, unspecified: Secondary | ICD-10-CM | POA: Diagnosis not present

## 2021-04-07 DIAGNOSIS — I119 Hypertensive heart disease without heart failure: Secondary | ICD-10-CM | POA: Diagnosis not present

## 2021-04-07 DIAGNOSIS — M5410 Radiculopathy, site unspecified: Secondary | ICD-10-CM | POA: Diagnosis not present

## 2021-04-07 DIAGNOSIS — G40909 Epilepsy, unspecified, not intractable, without status epilepticus: Secondary | ICD-10-CM | POA: Diagnosis not present

## 2021-04-07 DIAGNOSIS — M549 Dorsalgia, unspecified: Secondary | ICD-10-CM | POA: Diagnosis not present

## 2021-04-07 DIAGNOSIS — S065X9D Traumatic subdural hemorrhage with loss of consciousness of unspecified duration, subsequent encounter: Secondary | ICD-10-CM | POA: Diagnosis not present

## 2021-04-07 DIAGNOSIS — R1319 Other dysphagia: Secondary | ICD-10-CM | POA: Diagnosis not present

## 2021-04-07 DIAGNOSIS — G43709 Chronic migraine without aura, not intractable, without status migrainosus: Secondary | ICD-10-CM | POA: Diagnosis not present

## 2021-04-07 DIAGNOSIS — G8929 Other chronic pain: Secondary | ICD-10-CM | POA: Diagnosis not present

## 2021-04-07 DIAGNOSIS — G629 Polyneuropathy, unspecified: Secondary | ICD-10-CM | POA: Diagnosis not present

## 2021-04-08 DIAGNOSIS — M549 Dorsalgia, unspecified: Secondary | ICD-10-CM | POA: Diagnosis not present

## 2021-04-08 DIAGNOSIS — M5410 Radiculopathy, site unspecified: Secondary | ICD-10-CM | POA: Diagnosis not present

## 2021-04-08 DIAGNOSIS — G629 Polyneuropathy, unspecified: Secondary | ICD-10-CM | POA: Diagnosis not present

## 2021-04-08 DIAGNOSIS — G40909 Epilepsy, unspecified, not intractable, without status epilepticus: Secondary | ICD-10-CM | POA: Diagnosis not present

## 2021-04-08 DIAGNOSIS — G8929 Other chronic pain: Secondary | ICD-10-CM | POA: Diagnosis not present

## 2021-04-08 DIAGNOSIS — S065X9D Traumatic subdural hemorrhage with loss of consciousness of unspecified duration, subsequent encounter: Secondary | ICD-10-CM | POA: Diagnosis not present

## 2021-04-08 DIAGNOSIS — G43709 Chronic migraine without aura, not intractable, without status migrainosus: Secondary | ICD-10-CM | POA: Diagnosis not present

## 2021-04-08 DIAGNOSIS — R1319 Other dysphagia: Secondary | ICD-10-CM | POA: Diagnosis not present

## 2021-04-08 DIAGNOSIS — I119 Hypertensive heart disease without heart failure: Secondary | ICD-10-CM | POA: Diagnosis not present

## 2021-04-10 DIAGNOSIS — G40909 Epilepsy, unspecified, not intractable, without status epilepticus: Secondary | ICD-10-CM | POA: Diagnosis not present

## 2021-04-10 DIAGNOSIS — I119 Hypertensive heart disease without heart failure: Secondary | ICD-10-CM | POA: Diagnosis not present

## 2021-04-10 DIAGNOSIS — G629 Polyneuropathy, unspecified: Secondary | ICD-10-CM | POA: Diagnosis not present

## 2021-04-10 DIAGNOSIS — S065X9D Traumatic subdural hemorrhage with loss of consciousness of unspecified duration, subsequent encounter: Secondary | ICD-10-CM | POA: Diagnosis not present

## 2021-04-10 DIAGNOSIS — G8929 Other chronic pain: Secondary | ICD-10-CM | POA: Diagnosis not present

## 2021-04-10 DIAGNOSIS — G43709 Chronic migraine without aura, not intractable, without status migrainosus: Secondary | ICD-10-CM | POA: Diagnosis not present

## 2021-04-10 DIAGNOSIS — M5410 Radiculopathy, site unspecified: Secondary | ICD-10-CM | POA: Diagnosis not present

## 2021-04-10 DIAGNOSIS — M549 Dorsalgia, unspecified: Secondary | ICD-10-CM | POA: Diagnosis not present

## 2021-04-10 DIAGNOSIS — R1319 Other dysphagia: Secondary | ICD-10-CM | POA: Diagnosis not present

## 2021-04-11 DIAGNOSIS — M5410 Radiculopathy, site unspecified: Secondary | ICD-10-CM | POA: Diagnosis not present

## 2021-04-11 DIAGNOSIS — S065X9D Traumatic subdural hemorrhage with loss of consciousness of unspecified duration, subsequent encounter: Secondary | ICD-10-CM | POA: Diagnosis not present

## 2021-04-11 DIAGNOSIS — G40909 Epilepsy, unspecified, not intractable, without status epilepticus: Secondary | ICD-10-CM | POA: Diagnosis not present

## 2021-04-11 DIAGNOSIS — M549 Dorsalgia, unspecified: Secondary | ICD-10-CM | POA: Diagnosis not present

## 2021-04-11 DIAGNOSIS — G8929 Other chronic pain: Secondary | ICD-10-CM | POA: Diagnosis not present

## 2021-04-11 DIAGNOSIS — G43709 Chronic migraine without aura, not intractable, without status migrainosus: Secondary | ICD-10-CM | POA: Diagnosis not present

## 2021-04-11 DIAGNOSIS — G629 Polyneuropathy, unspecified: Secondary | ICD-10-CM | POA: Diagnosis not present

## 2021-04-11 DIAGNOSIS — R1319 Other dysphagia: Secondary | ICD-10-CM | POA: Diagnosis not present

## 2021-04-11 DIAGNOSIS — I119 Hypertensive heart disease without heart failure: Secondary | ICD-10-CM | POA: Diagnosis not present

## 2021-04-15 ENCOUNTER — Encounter: Payer: Self-pay | Admitting: Neurology

## 2021-04-15 DIAGNOSIS — R1319 Other dysphagia: Secondary | ICD-10-CM | POA: Diagnosis not present

## 2021-04-15 DIAGNOSIS — G43709 Chronic migraine without aura, not intractable, without status migrainosus: Secondary | ICD-10-CM | POA: Diagnosis not present

## 2021-04-15 DIAGNOSIS — I119 Hypertensive heart disease without heart failure: Secondary | ICD-10-CM | POA: Diagnosis not present

## 2021-04-15 DIAGNOSIS — S065X9D Traumatic subdural hemorrhage with loss of consciousness of unspecified duration, subsequent encounter: Secondary | ICD-10-CM | POA: Diagnosis not present

## 2021-04-15 DIAGNOSIS — M5410 Radiculopathy, site unspecified: Secondary | ICD-10-CM | POA: Diagnosis not present

## 2021-04-15 DIAGNOSIS — G8929 Other chronic pain: Secondary | ICD-10-CM | POA: Diagnosis not present

## 2021-04-15 DIAGNOSIS — G629 Polyneuropathy, unspecified: Secondary | ICD-10-CM | POA: Diagnosis not present

## 2021-04-15 DIAGNOSIS — M549 Dorsalgia, unspecified: Secondary | ICD-10-CM | POA: Diagnosis not present

## 2021-04-15 DIAGNOSIS — G40909 Epilepsy, unspecified, not intractable, without status epilepticus: Secondary | ICD-10-CM | POA: Diagnosis not present

## 2021-04-16 ENCOUNTER — Ambulatory Visit: Payer: Medicare HMO | Admitting: Neurology

## 2021-04-16 ENCOUNTER — Telehealth: Payer: Self-pay | Admitting: Neurology

## 2021-04-16 ENCOUNTER — Encounter: Payer: Self-pay | Admitting: Neurology

## 2021-04-16 VITALS — BP 131/81 | HR 95 | Ht 63.0 in | Wt 116.0 lb

## 2021-04-16 DIAGNOSIS — S065XAA Traumatic subdural hemorrhage with loss of consciousness status unknown, initial encounter: Secondary | ICD-10-CM

## 2021-04-16 DIAGNOSIS — F0390 Unspecified dementia without behavioral disturbance: Secondary | ICD-10-CM | POA: Insufficient documentation

## 2021-04-16 DIAGNOSIS — I69359 Hemiplegia and hemiparesis following cerebral infarction affecting unspecified side: Secondary | ICD-10-CM | POA: Diagnosis not present

## 2021-04-16 DIAGNOSIS — I6200 Nontraumatic subdural hemorrhage, unspecified: Secondary | ICD-10-CM

## 2021-04-16 DIAGNOSIS — S069XAA Unspecified intracranial injury with loss of consciousness status unknown, initial encounter: Secondary | ICD-10-CM | POA: Insufficient documentation

## 2021-04-16 DIAGNOSIS — R2681 Unsteadiness on feet: Secondary | ICD-10-CM | POA: Diagnosis not present

## 2021-04-16 DIAGNOSIS — S065X9A Traumatic subdural hemorrhage with loss of consciousness of unspecified duration, initial encounter: Secondary | ICD-10-CM

## 2021-04-16 DIAGNOSIS — S069X9A Unspecified intracranial injury with loss of consciousness of unspecified duration, initial encounter: Secondary | ICD-10-CM | POA: Insufficient documentation

## 2021-04-16 DIAGNOSIS — S069X1S Unspecified intracranial injury with loss of consciousness of 30 minutes or less, sequela: Secondary | ICD-10-CM

## 2021-04-16 MED ORDER — DONEPEZIL HCL 5 MG PO TABS: 5.0000 mg | ORAL_TABLET | Freq: Every day | ORAL | 5 refills | Status: DC

## 2021-04-16 NOTE — Patient Instructions (Signed)
Subdural Hematoma  A subdural hematoma is a collection of blood between the brain and its outer covering (dura). As the amount of blood increases, pressure builds on the brain. There are two types of subdural hematomas:  Acute. This type develops shortly after a hard, direct hit to the head and causes blood to collect very quickly. This is a medical emergency. If it is not diagnosed and treated quickly, it can lead to severe brain injury or death.  Chronic. This is when bleeding develops more slowly, over weeks or months. In some cases, this type does not cause symptoms. What are the causes? This condition is caused by bleeding (hemorrhage) from a broken (ruptured) blood vessel. In most cases, a blood vessel ruptures and bleeds because of a head injury, such as from a hard, direct hit. Head injuries can happen in car accidents, falls, assaults, or while playing sports. In rare cases, a hemorrhage can happen without a known cause (spontaneously), especially if you take blood thinners (anticoagulants). What increases the risk? This condition is more likely to develop in:  Older people.  Infants.  People who take blood thinners.  People who have head injuries.  People who abuse alcohol. What are the signs or symptoms? Symptoms of this condition can vary depending on the size of the hematoma. Symptoms can be mild, severe, or life-threatening. They include:  Headaches.  Nausea or vomiting.  Changes in vision, such as double vision or loss of vision.  Changes in speech or trouble understanding what people say.  Loss of balance or trouble walking.  Weakness, numbness, or tingling in the arms or legs, especially on one side of the body.  Seizures.  Change in personality.  Increased sleepiness.  Memory loss.  Loss of consciousness.  Coma. Symptoms of acute subdural hematoma can develop over minutes or hours. Symptoms of chronic subdural hematoma may develop over weeks or  months. How is this diagnosed? This condition is diagnosed based on the results of:  A physical exam.  Tests of strength, reflexes, coordination, senses, manner of walking (gait), and facial and eye movements (neurological exam).  Imaging tests, such as an MRI or a CT scan. How is this treated? Treatment for this condition depends on the type of hematoma and how severe it is. Treatment for acute hematoma may include:  Emergency surgery to drain blood or remove a blood clot.  Medicines that help the body get rid of excess fluids (diuretics). These may help to reduce pressure in the brain.  Assisted breathing (ventilation). Treatment for chronic hematoma may include:  Observation and bed rest at the hospital.  Surgery. If you take blood thinners, you may need to stop taking them for a short time. You may also be given anti-seizure (anticonvulsant) medicine. Sometimes, no treatment is needed for chronic subdural hematoma. Follow these instructions at home: Activity  Avoid situations where you could injure your head again, such as in competitive sports, downhill snow sports, and horseback riding. Do not do these activities until your health care provider approves. ? Wear protective gear, such as a helmet, when participating in activities such as biking or contact sports.  Avoid too much visual stimulation while recovering. This means limiting how much you read and limiting your screen time on a smart phone, tablet, computer, or TV.  Rest as told by your health care provider. Rest helps the brain heal.  Try to avoid activities that cause physical or mental stress. Return to work or school as told by  your health care provider.  Do not lift anything that is heavier than 5 lb (2.3 kg), or the limit you are told, until your health care provider says that it is safe.  Do not drive, ride a bike, or use heavy machinery until your health care provider approves.  Always wear your seat belt  when you are in a motor vehicle. Alcohol use  Do not drink alcohol if your health care provider tells you not to drink.  If you drink alcohol, limit how much you use to: ? 0-1 drink a day for women. ? 0-2 drinks a day for men. General instructions  Monitor your symptoms, and ask people around you to do the same. Recovery from brain injuries varies. Talk with your health care provider about what to expect.  Take over-the-counter and prescription medicines only as told by your health care provider. Do not take blood thinners or NSAIDs unless your health care provider approves. These include aspirin, ibuprofen, naproxen, and warfarin.  Keep your home environment safe to reduce the risk of falling.  Keep all follow-up visits as told by your health care provider. This is important. Where to find more information  National Institute of Neurological Disorders and Stroke: www.ninds.nih.gov  American Academy of Neurology (AAN): www.aan.com  Brain Injury Association of America: www.biausa.org Get help right away if you:  Are taking blood thinners and you fall or you experience minor trauma to the head. If you take any blood thinners, even a very small injury can cause a subdural hematoma.  Have a bleeding disorder and you fall or you experience minor trauma to the head.  Develop any of the following symptoms after a head injury: ? Clear fluid draining from your nose or ears. ? Nausea or vomiting. ? Changes in speech or trouble understanding what people say. ? Seizures. ? Drowsiness or a decrease in alertness. ? Double vision. ? Numbness or inability to move (paralysis) in any part of your body. ? Difficulty walking or poor coordination. ? Difficulty thinking. ? Confusion or forgetfulness. ? Personality changes. ? Irrational or aggressive behavior. These symptoms may represent a serious problem that is an emergency. Do not wait to see if the symptoms will go away. Get medical help  right away. Call your local emergency services (911 in the [place] Do not drive yourself to the hospital. Summary  A subdural hematoma is a collection of blood between the brain and its outer covering (dura).  Treatment for this condition depends on what type of subdural hematoma you have and how severe it is.  Symptoms can vary from mild to severe to life-threatening.  Monitor your symptoms, and ask others around you to do the same. This information is not intended to replace advice given to you by your health care provider. Make sure you discuss any questions you have with your health care provider. Document Revised: 10/17/2018 Document Reviewed: 10/17/2018 Elsevier Patient Education  2021 Elsevier Inc.  

## 2021-04-16 NOTE — Telephone Encounter (Signed)
Rx sent to [facility] pharmacy and pts husband has been notified.

## 2021-04-16 NOTE — Telephone Encounter (Signed)
Pt is needing her donepezil (ARICEPT) 5 MG tablet sent in to the [facility] on [address] and not the [place] Please inform the pt when the medication has been switched pharmacies.

## 2021-04-16 NOTE — Progress Notes (Signed)
Provider:  Larey Seat, M D  Referring Provider: Ginger Organ., MD Primary Care Physician:  Ginger Organ., MD  Chief Complaint  Patient presents with  . New Patient (Initial Visit)    Pt with husband and daughter, rm 89.     HPI:  [name] [name] is a 85 y.o. female  and seen here as a referral  from Dr. Carmie Kanner for an evaluation of declining gait and cognition.  Before her fall in March 2021 a she was ambulating but her vision had declined due to macular degeneration- she suffered a subdural hematoma, suffered frequent seizures- one with LOC- initially- was only then seen in [facility] and had a head CT.  and the multiple spells afterwards without loss of awareness and she was treated at home, with Keppra to prevent seizures.  She had just another fall 02-06-2021 and this time fell onto the right forehead- CT showed another bleed, Speech and PT have noted ongoing decline. She was no longer following the cues, decline in cognitive ability , slurred speech.  She underwent a swallowing test at [facility] on March 2. No results were accessible in [facility].   Her speech can be clear when she is stimulated.     Quoted above : 3-12-2021_ CLINICAL DATA:  Code stroke. Left facial droop. Left-sided weakness.  EXAM: CT HEAD WITHOUT CONTRAST  TECHNIQUE: Contiguous axial images were obtained from the base of the skull through the vertex without intravenous contrast.  COMPARISON:  CT head without contrast 09/19/2004. MR head without contrast 03/02/2007.  FINDINGS: Brain: Acute extra-axial hemorrhage is noticed along the right side of the inter cerebral falx. Both subdural and subarachnoid blood are present. No significant intraventricular hemorrhage is present. No lower hemorrhage is present.  Mild diffuse atrophy is present. Moderate white matter changes extend into the anterior limb of the internal capsule. The brainstem and cerebellum are within normal limits.  The ventricles are proportionate to the degree of atrophy.  Vascular: Atherosclerotic calcifications are present within the cavernous internal carotid arteries bilaterally. No hyperdense vessel is present.  Skull: Calvarium is intact. No focal lytic or blastic lesions are present. No significant extracranial soft tissue lesion is present.  Sinuses/Orbits: The paranasal sinuses and mastoid air cells are clear. Bilateral lens replacements are noted. Globes and orbits are otherwise unremarkable.  ASPECTS [facility] Stroke Program Early CT Score)  - Ganglionic level infarction (caudate, lentiform nuclei, internal capsule, insula, M1-M3 cortex): 7/7  - Supraganglionic infarction (M4-M6 cortex): 3/3  Total score (0-10 with 10 being normal): 10/10  IMPRESSION: 1. Acute subarachnoid and subdural hematoma along the right side of the inter cerebral falx effacing the sulci along the posteromedial right frontal lobe. 2. Moderate generalized atrophy and white matter disease. 3. No acute parenchymal infarct. 4. ASPECTS is 10/10  These results were called by telephone at the time of interpretation on 02/09/2020 at 9:32 pm to provider [facility] , who verbally acknowledged these results.   Electronically Signed   By: San Morelle M.D.   On: 02/09/2020 21:54  Status post fall.  EXAM: CT HEAD WITHOUT CONTRAST  TECHNIQUE: Contiguous axial images were obtained from the base of the skull through the vertex without intravenous contrast.  COMPARISON:  None.  FINDINGS: Brain: There is mild cerebral atrophy with widening of the extra-axial spaces and ventricular dilatation. There are areas of decreased attenuation within the white matter tracts of the supratentorial brain, consistent with microvascular disease  changes.  A 1.2 cm x 0.5 cm x 1.5 cm and 0.3 cm x 0.3 cm x 0.3 cm areas of acute subdural blood are seen within the frontal lobe on the right (axial  CT images 14 and 15, CT series number 2).  A similar appearing 0.5 cm x 0.3 cm x 0.5 cm subdural focus of acute blood is noted within the frontal parietal region on the right (coronal reformatted image 45, CT series number 4).  There is no evidence of associated mass effect or midline shift.  Vascular: No hyperdense vessel or unexpected calcification.  Skull: Normal. Negative for fracture or focal lesion.  Sinuses/Orbits: There is moderate severity bilateral ethmoid sinus mucosal thickening.  Other: Moderate severity right periorbital, right preseptal and right frontal scalp soft tissue swelling is seen. Moderate severity right facial soft tissue swelling is also noted.  IMPRESSION: 1. Small foci of acute subdural blood within the right frontal lobe and right frontal parietal region. MRI correlation is recommended. 2. Right facial, right periorbital, right preseptal and right frontal scalp soft tissue swelling. 3. Mild cerebral atrophy with widening of the extra-axial spaces and ventricular dilatation. 4. Moderate severity bilateral ethmoid sinus disease.   Electronically Signed   By: Virgina Norfolk M.D.   On: 02/06/2021 15:25  IMPRESSION: 1. Marked severity multilevel degenerative changes, most prominent at the level of C6-C7. 2. Approximately 1 mm to 2 mm anterolisthesis of the C5 vertebral body on C6. 3. No evidence of an acute fracture within the cervical spine.   Electronically Signed   By: Virgina Norfolk M.D.   On: 02/06/2021 15:32  The patient had 5 compression fractures in the thoracic spine.     Review of Systems: Out of a complete 14 system review, the patient complains of only the following symptoms, and all other reviewed systems are negative.   Gait impairment, right spasticity, left elevated tone,  Fall risk elevated. Cognitive decline, psycho- motor slowing  Social History   Socioeconomic History  . Marital status: Married     Spouse name: Not on file  . Number of children: Not on file  . Years of education: Not on file  . Highest education level: Not on file  Occupational History  . Occupation: retired    Comment: now volunteers at hospital  Tobacco Use  . Smoking status: Former Smoker    Years: 10.00    Types: Cigarettes  . Smokeless tobacco: Never Used  Vaping Use  . Vaping Use: Never used  Substance and Sexual Activity  . Alcohol use: No  . Drug use: No  . Sexual activity: Yes    Partners: Male  Other Topics Concern  . Not on file  Social History Narrative  . Not on file   Social Determinants of Health   Financial Resource Strain: Not on file  Food Insecurity: Not on file  Transportation Needs: Not on file  Physical Activity: Not on file  Stress: Not on file  Social Connections: Not on file  Intimate Partner Violence: Not on file    Family History  Problem Relation Age of Onset  . Breast cancer Maternal Aunt   . Colon cancer Maternal Aunt   . Dementia Mother   . Cancer Brother        kidney cancer  . Aneurysm Sister 34       died   . Breast cancer Paternal Aunt   . Esophageal cancer Neg Hx   . Rectal cancer Neg Hx   .  Stomach cancer Neg Hx     Past Medical History:  Diagnosis Date  . Acid reflux disease   . Anemia    history of  . Arthritis    both hands  . Blood transfusion without reported diagnosis   . Cataract    bilateral cateracts removed  . Common migraine   . Diverticulosis    patient denies  . DJD (degenerative joint disease)   . Esophageal stricture   . Herniated disc   . Hiatal hernia 2009   EGD-Small   . History of colonic polyps   . Hyperlipidemia   . Lacunar infarction ([place])   . Macular degeneration   . Macular degeneration 2016   per husband  . Osteoporosis   . Sleep apnea    has c-pap but does not wear  . Spinal stress fracture 2019   per husband 09/29/2018    Past Surgical History:  Procedure Laterality Date  . CHOLECYSTECTOMY     4-5  YRS AGO.  Marland Kitchen COLONOSCOPY    . ESOPHAGEAL DILATION    . EYE SURGERY     BILATERAL CATARAT  . INGUINAL HERNIA REPAIR     rt.  Marland Kitchen RECTOCELE REPAIR N/A 02/13/2013   Procedure: POSTERIOR REPAIR (RECTOCELE);  Surgeon: Emily Filbert, MD;  Location: [facility];  Service: Gynecology;  Laterality: N/A;  . ROBOTIC ASSISTED LAPAROSCOPIC LYSIS OF ADHESION  02/13/2016   Procedure: ROBOTIC ASSISTED LAPAROSCOPIC LYSIS OF ADHESION;  Surgeon: Princess Bruins, MD;  Location: [facility];  Service: Gynecology;;  . ROBOTIC ASSISTED LAPAROSCOPIC SACROCOLPOPEXY N/A 02/13/2016   Procedure: ROBOTIC ASSISTED LAPAROSCOPIC SACROCOLPOPEXY With Theola Sequin;  Surgeon: Princess Bruins, MD;  Location: [facility];  Service: Gynecology;  Laterality: N/A;  . TOTAL ABDOMINAL HYSTERECTOMY    . UPPER GASTROINTESTINAL ENDOSCOPY      Current Outpatient Medications  Medication Sig Dispense Refill  . acetaminophen (TYLENOL) 325 MG tablet Take 2 tablets (650 mg total) by mouth every 6 (six) hours as needed for mild pain or headache.    Marland Kitchen artificial tears (LACRILUBE) OINT ophthalmic ointment Place into both eyes every 3 (three) hours as needed for dry eyes.    Marland Kitchen docusate sodium (COLACE) 100 MG capsule Take 100 mg by mouth daily.    Marland Kitchen gabapentin (NEURONTIN) 100 MG capsule Take 100 mg by mouth at bedtime.    . hydrOXYzine (ATARAX/VISTARIL) 25 MG tablet Take 25 mg by mouth every 8 (eight) hours as needed.    . levETIRAcetam (KEPPRA) 500 MG tablet Take 1 tablet (500 mg total) by mouth 2 (two) times daily. 60 tablet 1  . loratadine (CLARITIN) 10 MG tablet Take 10 mg by mouth at bedtime.    . melatonin 3 MG TABS tablet Take 1 tablet (3 mg total) by mouth at bedtime as needed.  0  . Multiple Vitamins-Minerals (CENTRUM SILVER ADULT 50+ PO) Take 1 tablet by mouth daily.    . pantoprazole (PROTONIX) 40 MG tablet Take 40 mg by mouth 2 (two) times daily.    . polyethylene glycol (MIRALAX / GLYCOLAX) packet Take 17 g by mouth 2 (two) times daily as needed.  (Patient taking differently: Take 17 g by mouth daily as needed for mild constipation.) 14 each 0   No current facility-administered medications for this visit.    Allergies as of 04/16/2021 - Review Complete 04/16/2021  Allergen Reaction Noted  . Isometheptene-dichloral-apap Nausea Only 06/25/2013  . Oxycodone hcl Nausea And Vomiting and Other (See Comments)     Vitals:  There were no vitals taken for this visit. Last Weight:  Wt Readings from Last 1 Encounters:  02/06/21 120 lb 9.5 oz (54.7 kg)   Last Height:   Ht Readings from Last 1 Encounters:  02/06/21 5\' 5"  (1.651 m)    Physical exam:  General: The patient is awake, alert and appears not in acute distress.  She is in a wheelchair, with slurred speech, slowed motor function.  The patient is well groomed. Head: Normocephalic, atraumatic. Neck is supple.   Cardiovascular:  Regular rate and rhythm , without  murmurs or carotid bruit, and without distended neck veins. Respiratory: Lungs are clear to auscultation. Skin:  Without evidence of edema, or rash Trunk: BMI is normal.  Neurologic exam : The patient is drowsy- not fully oriented to place and time.   Memory subjective described as impaired .  There is a reduced attention span & concentration ability.  Speech is non fluent with dysarthria, dysphonia and aphasia.   mini-mental status exam MMSE - Mini Mental State Exam 04/16/2021  Orientation to time 1  Orientation to Place 2  Registration 3  Attention/ Calculation 5  Recall 0  Language- name 2 objects 2  Language- repeat 1  Language- follow 3 step command 3  Language- read & follow direction 0  Language-read & follow direction-comments unable to do, legally blind  Write a sentence 1  Copy design 0  Copy design-comments unable to do, legally blind  Total score 18     Mood and affect are depressed.   Cranial nerves: Pupils are equal and not veryreactive to light. 3 mm-  Funduscopic exam - macular  degeneration. Extraocular movements in vertical and horizontal planes intact and without nystagmus. Visual fields by finger perimetry are severely restricted, no peripheral vision.   Hearing to tuning fork intact.  Facial sensation intact to fine touch. Facial motor strength is symmetric and tongue and uvula move midline. Tongue protrusion into either cheek is normal. Shoulder shrug is normal.   Motor exam: Normal tone ,muscle bulk and symmetric strength in all extremities.  Sensory:  Fine touch, pinprick and vibration were tested in all extremities. Proprioception was normal.  Coordination: Rapid alternating movements in the fingers/hands were slowed.Finger-to-nose maneuver normal with only mild evidence of ataxia, dysmetria - more on the right.  Gait and station: Patient does not walk without assistive device . Strength is lost- right arm and hand weakness , right leg is stiffer,  Stance is impossible  Tandem gait is unfragmented. Romberg testing is deferred.   Deep tendon reflexes: in the upper and lower extremities are very brisk,  symmetric  Babinski maneuver response is upgoing on either side.   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 Her physical inabilities are reflected in bilateral up-going babinski response, frontal eye movement disturbance.   There is enough iron deposition on the brain surface to need to maintain antiseizure medication.   reduction of daytime medication with sedating effect- hydroxazine, only at night, gabapentin at night, Keppra 500 mg at night.   She has OSA - but I don't think she will benefit from CPAP now.   Low flow straw for oral hydration in her kind of dysphagia , oral protein intake to increase.   I will write for aricept, starting at 5  mg daily, then increase to  10 mg.  cognitive and gait decline but no incontinence after 2 brain bleeds, rule out NPH. CT head ordered to compare with early March study.    Rv in 3 month     Larey Seat MD 04/16/2021

## 2021-04-17 ENCOUNTER — Telehealth: Payer: Self-pay | Admitting: Neurology

## 2021-04-17 DIAGNOSIS — G40909 Epilepsy, unspecified, not intractable, without status epilepticus: Secondary | ICD-10-CM | POA: Diagnosis not present

## 2021-04-17 DIAGNOSIS — R1319 Other dysphagia: Secondary | ICD-10-CM | POA: Diagnosis not present

## 2021-04-17 DIAGNOSIS — M5410 Radiculopathy, site unspecified: Secondary | ICD-10-CM | POA: Diagnosis not present

## 2021-04-17 DIAGNOSIS — G8929 Other chronic pain: Secondary | ICD-10-CM | POA: Diagnosis not present

## 2021-04-17 DIAGNOSIS — I119 Hypertensive heart disease without heart failure: Secondary | ICD-10-CM | POA: Diagnosis not present

## 2021-04-17 DIAGNOSIS — G629 Polyneuropathy, unspecified: Secondary | ICD-10-CM | POA: Diagnosis not present

## 2021-04-17 DIAGNOSIS — M549 Dorsalgia, unspecified: Secondary | ICD-10-CM | POA: Diagnosis not present

## 2021-04-17 DIAGNOSIS — S065X9D Traumatic subdural hemorrhage with loss of consciousness of unspecified duration, subsequent encounter: Secondary | ICD-10-CM | POA: Diagnosis not present

## 2021-04-17 DIAGNOSIS — G43709 Chronic migraine without aura, not intractable, without status migrainosus: Secondary | ICD-10-CM | POA: Diagnosis not present

## 2021-04-17 NOTE — Telephone Encounter (Signed)
Received auth from [facility] for CT Head wo contrast. PA #[id number] (04/17/21- 05/17/21). Sent to [place].

## 2021-04-21 DIAGNOSIS — G40909 Epilepsy, unspecified, not intractable, without status epilepticus: Secondary | ICD-10-CM | POA: Diagnosis not present

## 2021-04-21 DIAGNOSIS — M5410 Radiculopathy, site unspecified: Secondary | ICD-10-CM | POA: Diagnosis not present

## 2021-04-21 DIAGNOSIS — G43709 Chronic migraine without aura, not intractable, without status migrainosus: Secondary | ICD-10-CM | POA: Diagnosis not present

## 2021-04-21 DIAGNOSIS — R296 Repeated falls: Secondary | ICD-10-CM | POA: Diagnosis not present

## 2021-04-21 DIAGNOSIS — M549 Dorsalgia, unspecified: Secondary | ICD-10-CM | POA: Diagnosis not present

## 2021-04-21 DIAGNOSIS — G8929 Other chronic pain: Secondary | ICD-10-CM | POA: Diagnosis not present

## 2021-04-21 DIAGNOSIS — I119 Hypertensive heart disease without heart failure: Secondary | ICD-10-CM | POA: Diagnosis not present

## 2021-04-21 DIAGNOSIS — R1319 Other dysphagia: Secondary | ICD-10-CM | POA: Diagnosis not present

## 2021-04-21 DIAGNOSIS — G629 Polyneuropathy, unspecified: Secondary | ICD-10-CM | POA: Diagnosis not present

## 2021-04-21 DIAGNOSIS — S065X9D Traumatic subdural hemorrhage with loss of consciousness of unspecified duration, subsequent encounter: Secondary | ICD-10-CM | POA: Diagnosis not present

## 2021-04-21 DIAGNOSIS — R079 Chest pain, unspecified: Secondary | ICD-10-CM | POA: Diagnosis not present

## 2021-04-22 DIAGNOSIS — M5410 Radiculopathy, site unspecified: Secondary | ICD-10-CM | POA: Diagnosis not present

## 2021-04-22 DIAGNOSIS — G40909 Epilepsy, unspecified, not intractable, without status epilepticus: Secondary | ICD-10-CM | POA: Diagnosis not present

## 2021-04-22 DIAGNOSIS — I119 Hypertensive heart disease without heart failure: Secondary | ICD-10-CM | POA: Diagnosis not present

## 2021-04-22 DIAGNOSIS — R1319 Other dysphagia: Secondary | ICD-10-CM | POA: Diagnosis not present

## 2021-04-22 DIAGNOSIS — G43709 Chronic migraine without aura, not intractable, without status migrainosus: Secondary | ICD-10-CM | POA: Diagnosis not present

## 2021-04-22 DIAGNOSIS — G629 Polyneuropathy, unspecified: Secondary | ICD-10-CM | POA: Diagnosis not present

## 2021-04-22 DIAGNOSIS — G8929 Other chronic pain: Secondary | ICD-10-CM | POA: Diagnosis not present

## 2021-04-22 DIAGNOSIS — S065X9D Traumatic subdural hemorrhage with loss of consciousness of unspecified duration, subsequent encounter: Secondary | ICD-10-CM | POA: Diagnosis not present

## 2021-04-22 DIAGNOSIS — M549 Dorsalgia, unspecified: Secondary | ICD-10-CM | POA: Diagnosis not present

## 2021-04-25 DIAGNOSIS — M5410 Radiculopathy, site unspecified: Secondary | ICD-10-CM | POA: Diagnosis not present

## 2021-04-25 DIAGNOSIS — G43709 Chronic migraine without aura, not intractable, without status migrainosus: Secondary | ICD-10-CM | POA: Diagnosis not present

## 2021-04-25 DIAGNOSIS — S065X9D Traumatic subdural hemorrhage with loss of consciousness of unspecified duration, subsequent encounter: Secondary | ICD-10-CM | POA: Diagnosis not present

## 2021-04-25 DIAGNOSIS — G40909 Epilepsy, unspecified, not intractable, without status epilepticus: Secondary | ICD-10-CM | POA: Diagnosis not present

## 2021-04-25 DIAGNOSIS — I119 Hypertensive heart disease without heart failure: Secondary | ICD-10-CM | POA: Diagnosis not present

## 2021-04-25 DIAGNOSIS — G8929 Other chronic pain: Secondary | ICD-10-CM | POA: Diagnosis not present

## 2021-04-25 DIAGNOSIS — M549 Dorsalgia, unspecified: Secondary | ICD-10-CM | POA: Diagnosis not present

## 2021-04-25 DIAGNOSIS — G629 Polyneuropathy, unspecified: Secondary | ICD-10-CM | POA: Diagnosis not present

## 2021-04-25 DIAGNOSIS — R1319 Other dysphagia: Secondary | ICD-10-CM | POA: Diagnosis not present

## 2021-04-26 ENCOUNTER — Other Ambulatory Visit: Payer: Self-pay

## 2021-04-26 ENCOUNTER — Emergency Department (HOSPITAL_BASED_OUTPATIENT_CLINIC_OR_DEPARTMENT_OTHER): Payer: Medicare HMO

## 2021-04-26 ENCOUNTER — Telehealth: Payer: Self-pay | Admitting: Neurology

## 2021-04-26 ENCOUNTER — Emergency Department (HOSPITAL_BASED_OUTPATIENT_CLINIC_OR_DEPARTMENT_OTHER)
Admission: EM | Admit: 2021-04-26 | Discharge: 2021-04-26 | Disposition: A | Discharge status: Home / Self Care | Payer: Medicare HMO | Attending: Emergency Medicine | Admitting: Emergency Medicine

## 2021-04-26 ENCOUNTER — Encounter (HOSPITAL_BASED_OUTPATIENT_CLINIC_OR_DEPARTMENT_OTHER): Payer: Self-pay | Admitting: *Deleted

## 2021-04-26 DIAGNOSIS — S0121XA Laceration without foreign body of nose, initial encounter: Secondary | ICD-10-CM

## 2021-04-26 DIAGNOSIS — Z87891 Personal history of nicotine dependence: Secondary | ICD-10-CM | POA: Diagnosis not present

## 2021-04-26 DIAGNOSIS — R55 Syncope and collapse: Secondary | ICD-10-CM

## 2021-04-26 DIAGNOSIS — S61412A Laceration without foreign body of left hand, initial encounter: Secondary | ICD-10-CM

## 2021-04-26 DIAGNOSIS — M47812 Spondylosis without myelopathy or radiculopathy, cervical region: Secondary | ICD-10-CM | POA: Diagnosis not present

## 2021-04-26 DIAGNOSIS — Z043 Encounter for examination and observation following other accident: Secondary | ICD-10-CM | POA: Diagnosis not present

## 2021-04-26 DIAGNOSIS — M4312 Spondylolisthesis, cervical region: Secondary | ICD-10-CM | POA: Diagnosis not present

## 2021-04-26 DIAGNOSIS — F0390 Unspecified dementia without behavioral disturbance: Secondary | ICD-10-CM | POA: Diagnosis not present

## 2021-04-26 DIAGNOSIS — W182XXA Fall in (into) shower or empty bathtub, initial encounter: Secondary | ICD-10-CM | POA: Diagnosis not present

## 2021-04-26 DIAGNOSIS — S022XXB Fracture of nasal bones, initial encounter for open fracture: Secondary | ICD-10-CM

## 2021-04-26 DIAGNOSIS — S0992XA Unspecified injury of nose, initial encounter: Secondary | ICD-10-CM | POA: Diagnosis present

## 2021-04-26 DIAGNOSIS — Y92002 Bathroom of unspecified non-institutional (private) residence single-family (private) house as the place of occurrence of the external cause: Secondary | ICD-10-CM | POA: Insufficient documentation

## 2021-04-26 DIAGNOSIS — S01512A Laceration without foreign body of oral cavity, initial encounter: Secondary | ICD-10-CM | POA: Diagnosis not present

## 2021-04-26 DIAGNOSIS — R410 Disorientation, unspecified: Secondary | ICD-10-CM | POA: Diagnosis not present

## 2021-04-26 DIAGNOSIS — G319 Degenerative disease of nervous system, unspecified: Secondary | ICD-10-CM | POA: Diagnosis not present

## 2021-04-26 DIAGNOSIS — S0990XA Unspecified injury of head, initial encounter: Secondary | ICD-10-CM | POA: Diagnosis not present

## 2021-04-26 DIAGNOSIS — I10 Essential (primary) hypertension: Secondary | ICD-10-CM | POA: Diagnosis not present

## 2021-04-26 DIAGNOSIS — S022XXA Fracture of nasal bones, initial encounter for closed fracture: Secondary | ICD-10-CM | POA: Diagnosis not present

## 2021-04-26 DIAGNOSIS — W19XXXA Unspecified fall, initial encounter: Secondary | ICD-10-CM

## 2021-04-26 LAB — CBC WITH DIFFERENTIAL/PLATELET
Abs Immature Granulocytes: 0.02 10*3/uL (ref 0.00–0.07)
Basophils Absolute: 0 10*3/uL (ref 0.0–0.1)
Basophils Relative: 1 %
Eosinophils Absolute: 0.1 10*3/uL (ref 0.0–0.5)
Eosinophils Relative: 3 %
HCT: 34.9 % — ABNORMAL LOW (ref 36.0–46.0)
Hemoglobin: 11.5 g/dL — ABNORMAL LOW (ref 12.0–15.0)
Immature Granulocytes: 1 %
Lymphocytes Relative: 38 %
Lymphs Abs: 1.4 10*3/uL (ref 0.7–4.0)
MCH: 29.6 pg (ref 26.0–34.0)
MCHC: 33 g/dL (ref 30.0–36.0)
MCV: 89.9 fL (ref 80.0–100.0)
Monocytes Absolute: 0.3 10*3/uL (ref 0.1–1.0)
Monocytes Relative: 9 %
Neutro Abs: 1.8 10*3/uL (ref 1.7–7.7)
Neutrophils Relative %: 48 %
Platelets: 138 10*3/uL — ABNORMAL LOW (ref 150–400)
RBC: 3.88 MIL/uL (ref 3.87–5.11)
RDW: 14.9 % (ref 11.5–15.5)
WBC: 3.8 10*3/uL — ABNORMAL LOW (ref 4.0–10.5)

## 2021-04-26 LAB — BASIC METABOLIC PANEL
Anion gap: 9 (ref 5–15)
BUN: 11 mg/dL (ref 8–23)
CO2: 26 mmol/L (ref 22–32)
Calcium: 9 mg/dL (ref 8.9–10.3)
Chloride: 107 mmol/L (ref 98–111)
Creatinine, Ser: 0.57 mg/dL (ref 0.44–1.00)
GFR, Estimated: >60 mL/min (ref >60)
Glucose, Bld: 112 mg/dL — ABNORMAL HIGH (ref 70–99)
Potassium: 3.6 mmol/L (ref 3.5–5.1)
Sodium: 142 mmol/L (ref 135–145)

## 2021-04-26 LAB — CBG MONITORING, ED: Glucose-Capillary: 109 mg/dL — ABNORMAL HIGH (ref 70–99)

## 2021-04-26 MED ORDER — LIDOCAINE HCL (PF) 1 % IJ SOLN
5.0000 mL | Freq: Once | INTRAMUSCULAR | Status: AC
  Administered 2021-04-26: 5 mL via INTRADERMAL
  Filled 2021-04-26: qty 5

## 2021-04-26 MED ORDER — OXYMETAZOLINE HCL 0.05 % NA SOLN
1.0000 | Freq: Once | NASAL | Status: AC
  Administered 2021-04-26: 1 via NASAL
  Filled 2021-04-26: qty 30

## 2021-04-26 MED ORDER — LIDOCAINE-EPINEPHRINE (PF) 2 %-1:200000 IJ SOLN
20.0000 mL | Freq: Once | INTRAMUSCULAR | Status: AC
  Administered 2021-04-26: 20 mL
  Filled 2021-04-26: qty 20

## 2021-04-26 NOTE — Telephone Encounter (Signed)
Patient fell and had fractures, family called to let us know. They have a CT scheduled on 6/2, please let them know if they should still procede. thanks

## 2021-04-26 NOTE — ED Provider Notes (Signed)
[facility] EMERGENC[facility] DEPT Provider Note   CSN: [id number] Arrival date & time: 04/26/21  1497     History Chief Complaint  Patient presents with  . Fall    [name] [name] is a 85 y.o. female.  85 yo F with a chief complaints of a fall.  The patient went to the bathroom and when she was trying to get up she fell onto her face.  She thinks that she felt a bit lightheaded is why she fell down.  She injured her face but denies any other obvious injury.  Denies pain to the neck or the back of the chest or the abdomen.  She was unable to get off the ground but her husband was able to help her up and then brought her here for evaluation.  The history is provided by the patient and the spouse.  Fall This is a new problem. The current episode started less than 1 hour ago. The problem occurs constantly. The problem has not changed since onset.Pertinent negatives include no chest pain, no headaches and no shortness of breath. Nothing aggravates the symptoms. Nothing relieves the symptoms. She has tried nothing for the symptoms. The treatment provided no relief.       Past Medical History:  Diagnosis Date  . Acid reflux disease   . Anemia    history of  . Arthritis    both hands  . Blood transfusion without reported diagnosis   . Cataract    bilateral cateracts removed  . Common migraine   . Diverticulosis    patient denies  . DJD (degenerative joint disease)   . Esophageal stricture   . Herniated disc   . Hiatal hernia 2009   EGD-Small   . History of colonic polyps   . Hyperlipidemia   . Lacunar infarction ([place])   . Macular degeneration   . Macular degeneration 2016   per husband  . Osteoporosis   . Sleep apnea    has c-pap but does not wear  . Spinal stress fracture 2019   per husband 09/29/2018    Patient Active Problem List   Diagnosis Date Noted  . TBI (traumatic brain injury) ([place]) 04/16/2021  . Dementia without behavioral disturbance ([place])  04/16/2021  . Subdural bleeding ([place]) 02/08/2021  . SDH (subdural hematoma) ([place]) 02/06/2021  . Chronic pain 02/06/2021  . Falls 02/06/2021  . Tachycardia   . Hypertension   . Advanced nonexudative age-related macular degeneration of both eyes with subfoveal involvement 07/01/2020  . Degenerative retinal drusen of both eyes 07/01/2020  . Pseudophakia 07/01/2020  . Postoperative state 02/13/2016  . Chest pain with moderate risk of acute coronary syndrome 10/08/2014  . Sepsis syndrome 06/26/2013  . Acute pyelonephritis 06/26/2013  . Migraine 03/28/2013  . Rectocele 02/13/2013  . Sleep apnea 02/16/2012  . Chronic daily headache 01/12/2012  . OSTEOPENIA 10/17/2008  . ESOPHAGEAL STRICTURE 05/02/2008  . DIVERTICULOSIS-COLON 05/02/2008  . DIVERTICULITIS-COLON 05/02/2008  . ABDOMINAL PAIN-EPIGASTRIC 05/02/2008  . GASTRITIS, ACUTE 03/27/2008  . LEG CRAMPS, NOCTURNAL 02/10/2008  . Headache(784.0) 02/10/2008  . OTHER DYSPHAGIA 02/10/2008  . HYPERLIPIDEMIA 02/06/2008  . LACUNAR INFARCTION 02/06/2008  . COLONIC POLYPS 10/13/2007  . Migraine without aura 09/27/2007  . Esophageal reflux 09/27/2007  . HERNIATED DISC 09/27/2007    Past Surgical History:  Procedure Laterality Date  . CHOLECYSTECTOMY     4-5 YRS AGO.  Marland Kitchen COLONOSCOPY    . ESOPHAGEAL DILATION    . EYE SURGERY  BILATERAL CATARAT  . INGUINAL HERNIA REPAIR     rt.  Marland Kitchen RECTOCELE REPAIR N/A 02/13/2013   Procedure: POSTERIOR REPAIR (RECTOCELE);  Surgeon: Emily Filbert, MD;  Location: [facility];  Service: Gynecology;  Laterality: N/A;  . ROBOTIC ASSISTED LAPAROSCOPIC LYSIS OF ADHESION  02/13/2016   Procedure: ROBOTIC ASSISTED LAPAROSCOPIC LYSIS OF ADHESION;  Surgeon: Princess Bruins, MD;  Location: [facility];  Service: Gynecology;;  . ROBOTIC ASSISTED LAPAROSCOPIC SACROCOLPOPEXY N/A 02/13/2016   Procedure: ROBOTIC ASSISTED LAPAROSCOPIC SACROCOLPOPEXY With Theola Sequin;  Surgeon: Princess Bruins, MD;  Location: [facility];  Service:  Gynecology;  Laterality: N/A;  . TOTAL ABDOMINAL HYSTERECTOMY    . UPPER GASTROINTESTINAL ENDOSCOPY       OB History    Gravida  3   Para  3   Term      Preterm      AB      Living  3     SAB      IAB      Ectopic      Multiple      Live Births              Family History  Problem Relation Age of Onset  . Breast cancer Maternal Aunt   . Colon cancer Maternal Aunt   . Dementia Mother   . Cancer Brother        kidney cancer  . Aneurysm Sister 22       died   . Breast cancer Paternal Aunt   . Esophageal cancer Neg Hx   . Rectal cancer Neg Hx   . Stomach cancer Neg Hx     Social History   Tobacco Use  . Smoking status: Former Smoker    Years: 10.00    Types: Cigarettes  . Smokeless tobacco: Never Used  Vaping Use  . Vaping Use: Never used  Substance Use Topics  . Alcohol use: No  . Drug use: No    Home Medications Prior to Admission medications   Medication Sig Start Date End Date Taking? Authorizing Provider  acetaminophen (TYLENOL) 325 MG tablet Take 2 tablets (650 mg total) by mouth every 6 (six) hours as needed for mild pain or headache. 02/09/21   Barb Merino, MD  artificial tears (LACRILUBE) OINT ophthalmic ointment Place into both eyes every 3 (three) hours as needed for dry eyes. 02/09/21   Barb Merino, MD  docusate sodium (COLACE) 100 MG capsule Take 100 mg by mouth daily.    [provider]  donepezil (ARICEPT) 5 MG tablet Take 1 tablet (5 mg total) by mouth at bedtime. 04/16/21   Dohmeier, Asencion Partridge, MD  gabapentin (NEURONTIN) 100 MG capsule Take 100 mg by mouth at bedtime. 01/10/20   [provider]  hydrOXYzine (ATARAX/VISTARIL) 25 MG tablet Take 25 mg by mouth every 8 (eight) hours as needed.    [provider]  levETIRAcetam (KEPPRA) 500 MG tablet Take 1 tablet (500 mg total) by mouth 2 (two) times daily. 02/09/20   Quintella Reichert, MD  loratadine (CLARITIN) 10 MG tablet Take 10 mg by mouth at bedtime.     [provider]  melatonin 3 MG TABS tablet Take 1 tablet (3 mg total) by mouth at bedtime as needed. 02/09/21   Barb Merino, MD  Multiple Vitamins-Minerals (CENTRUM SILVER ADULT 50+ PO) Take 1 tablet by mouth daily.    [provider]  pantoprazole (PROTONIX) 40 MG tablet Take 40 mg by mouth 2 (two) times daily.  [provider]  polyethylene glycol (MIRALAX / GLYCOLAX) packet Take 17 g by mouth 2 (two) times daily as needed. Patient taking differently: Take 17 g by mouth daily as needed for mild constipation. 10/03/18   Virgel Manifold, MD    Allergies    Isometheptene-dichloral-apap and Oxycodone hcl  Review of Systems   Review of Systems  Constitutional: Negative for chills and fever.  HENT: Negative for congestion and rhinorrhea.   Eyes: Negative for redness and visual disturbance.  Respiratory: Negative for shortness of breath and wheezing.   Cardiovascular: Negative for chest pain and palpitations.  Gastrointestinal: Negative for nausea and vomiting.  Genitourinary: Negative for dysuria and urgency.  Musculoskeletal: Negative for arthralgias and myalgias.  Skin: Positive for wound. Negative for pallor.  Neurological: Negative for dizziness and headaches.    Physical Exam Updated Vital Signs BP (!) 150/76   Pulse 99   Temp (!) 97.3 F (36.3 C) (Oral)   Resp 15   Ht 5\' 2"  (1.575 m)   Wt 52.6 kg   SpO2 97%   BMI 21.22 kg/m   Physical Exam Vitals and nursing note reviewed.  Constitutional:      General: She is not in acute distress.    Appearance: She is well-developed. She is not diaphoretic.  HENT:     Head: Normocephalic.     Comments: Laceration to the bridge of the nose.  No nasal septal hematoma.  Nasal bone deformity.  Eyes:     Pupils: Pupils are equal, round, and reactive to light.  Cardiovascular:     Rate and Rhythm: Normal rate and regular rhythm.     Heart sounds: No murmur heard. No friction rub. No gallop.   Pulmonary:      Effort: Pulmonary effort is normal.     Breath sounds: No wheezing or rales.  Abdominal:     General: There is no distension.     Palpations: Abdomen is soft.     Tenderness: There is no abdominal tenderness.  Musculoskeletal:        General: No tenderness.     Cervical back: Normal range of motion and neck supple.  Skin:    General: Skin is warm and dry.  Neurological:     Mental Status: She is alert.     Comments: Confused to questioning  Psychiatric:        Behavior: Behavior normal.     ED Results / Procedures / Treatments   Labs (all labs ordered are listed, but only abnormal results are displayed) Labs Reviewed  CBC WITH DIFFERENTIAL/PLATELET - Abnormal; Notable for the following components:      Result Value   WBC 3.8 (*)    Hemoglobin 11.5 (*)    HCT 34.9 (*)    Platelets 138 (*)    All other components within normal limits  BASIC METABOLIC PANEL - Abnormal; Notable for the following components:   Glucose, Bld 112 (*)    All other components within normal limits  CBG MONITORING, ED - Abnormal; Notable for the following components:   Glucose-Capillary 109 (*)    All other components within normal limits    EKG EKG Interpretation  Date/Time:  Saturday Apr 26 2021 06:44:40 EDT Ventricular Rate:  97 PR Interval:  170 QRS Duration: 90 QT Interval:  356 QTC Calculation: 453 R Axis:   25 Text Interpretation: Sinus rhythm Abnormal R-wave progression, early transition no wpw, prolonged qt or brugada No significant change since last tracing Confirmed by  Deno Etienne [phone]) on 04/26/2021 6:46:57 AM   Radiology CT Head Wo Contrast  Result Date: 04/26/2021 CLINICAL DATA:  Head trauma.  Fall in bathroom with nose laceration EXAM: CT HEAD WITHOUT CONTRAST CT MAXILLOFACIAL WITHOUT CONTRAST CT CERVICAL SPINE WITHOUT CONTRAST TECHNIQUE: Multidetector CT imaging of the head, cervical spine, and maxillofacial structures were performed using the standard protocol without  intravenous contrast. Multiplanar CT image reconstructions of the cervical spine and maxillofacial structures were also generated. COMPARISON:  02/06/2021 FINDINGS: CT HEAD FINDINGS Brain: No evidence of acute infarction, hemorrhage, hydrocephalus, extra-axial collection or mass lesion/mass effect. Generalized brain atrophy. Vascular: No hyperdense vessel or unexpected calcification. Skull: Negative for fracture or lesion CT MAXILLOFACIAL FINDINGS Osseous: Fracture of both nasal bones and at the anterior nasal septal attachment. There is a chronic anterior nasal septal perforation and likely chronic left-sided septal deviation with spur. Generalized nasal mucosal swelling. No orbital or ethmoid continuation. Located and intact mandible. Orbits: Bilateral cataract resection.  No visible injury. Sinuses: Negative for hemosinus Soft tissues: Soft tissue swelling centered on the nose. CT CERVICAL SPINE FINDINGS Alignment: No traumatic malalignment Skull base and vertebrae: No acute fracture. Soft tissues and spinal canal: No prevertebral fluid or swelling. No visible canal hematoma. Disc levels: Focal advanced disc degeneration at C6-7. Multilevel facet spurring, asymmetric to the left and associated with ankylosis at C2-3. Mild anterolisthesis at C5-6. Upper chest: Negative IMPRESSION: 1. Bilateral nasal arch and nasal septal fractures. 2. No evidence of intracranial injury or cervical spine fracture. Electronically Signed   By: Monte Fantasia M.D.   On: 04/26/2021 07:32   CT Cervical Spine Wo Contrast  Result Date: 04/26/2021 CLINICAL DATA:  Head trauma.  Fall in bathroom with nose laceration EXAM: CT HEAD WITHOUT CONTRAST CT MAXILLOFACIAL WITHOUT CONTRAST CT CERVICAL SPINE WITHOUT CONTRAST TECHNIQUE: Multidetector CT imaging of the head, cervical spine, and maxillofacial structures were performed using the standard protocol without intravenous contrast. Multiplanar CT image reconstructions of the cervical spine  and maxillofacial structures were also generated. COMPARISON:  02/06/2021 FINDINGS: CT HEAD FINDINGS Brain: No evidence of acute infarction, hemorrhage, hydrocephalus, extra-axial collection or mass lesion/mass effect. Generalized brain atrophy. Vascular: No hyperdense vessel or unexpected calcification. Skull: Negative for fracture or lesion CT MAXILLOFACIAL FINDINGS Osseous: Fracture of both nasal bones and at the anterior nasal septal attachment. There is a chronic anterior nasal septal perforation and likely chronic left-sided septal deviation with spur. Generalized nasal mucosal swelling. No orbital or ethmoid continuation. Located and intact mandible. Orbits: Bilateral cataract resection.  No visible injury. Sinuses: Negative for hemosinus Soft tissues: Soft tissue swelling centered on the nose. CT CERVICAL SPINE FINDINGS Alignment: No traumatic malalignment Skull base and vertebrae: No acute fracture. Soft tissues and spinal canal: No prevertebral fluid or swelling. No visible canal hematoma. Disc levels: Focal advanced disc degeneration at C6-7. Multilevel facet spurring, asymmetric to the left and associated with ankylosis at C2-3. Mild anterolisthesis at C5-6. Upper chest: Negative IMPRESSION: 1. Bilateral nasal arch and nasal septal fractures. 2. No evidence of intracranial injury or cervical spine fracture. Electronically Signed   By: Monte Fantasia M.D.   On: 04/26/2021 07:32   CT Maxillofacial Wo Contrast  Result Date: 04/26/2021 CLINICAL DATA:  Head trauma.  Fall in bathroom with nose laceration EXAM: CT HEAD WITHOUT CONTRAST CT MAXILLOFACIAL WITHOUT CONTRAST CT CERVICAL SPINE WITHOUT CONTRAST TECHNIQUE: Multidetector CT imaging of the head, cervical spine, and maxillofacial structures were performed using the standard protocol without intravenous contrast. Multiplanar CT image reconstructions  of the cervical spine and maxillofacial structures were also generated. COMPARISON:  02/06/2021  FINDINGS: CT HEAD FINDINGS Brain: No evidence of acute infarction, hemorrhage, hydrocephalus, extra-axial collection or mass lesion/mass effect. Generalized brain atrophy. Vascular: No hyperdense vessel or unexpected calcification. Skull: Negative for fracture or lesion CT MAXILLOFACIAL FINDINGS Osseous: Fracture of both nasal bones and at the anterior nasal septal attachment. There is a chronic anterior nasal septal perforation and likely chronic left-sided septal deviation with spur. Generalized nasal mucosal swelling. No orbital or ethmoid continuation. Located and intact mandible. Orbits: Bilateral cataract resection.  No visible injury. Sinuses: Negative for hemosinus Soft tissues: Soft tissue swelling centered on the nose. CT CERVICAL SPINE FINDINGS Alignment: No traumatic malalignment Skull base and vertebrae: No acute fracture. Soft tissues and spinal canal: No prevertebral fluid or swelling. No visible canal hematoma. Disc levels: Focal advanced disc degeneration at C6-7. Multilevel facet spurring, asymmetric to the left and associated with ankylosis at C2-3. Mild anterolisthesis at C5-6. Upper chest: Negative IMPRESSION: 1. Bilateral nasal arch and nasal septal fractures. 2. No evidence of intracranial injury or cervical spine fracture. Electronically Signed   By: Monte Fantasia M.D.   On: 04/26/2021 07:32    Procedures .Marland KitchenLaceration Repair  Date/Time: 04/26/2021 6:41 AM Performed by: Deno Etienne, DO Authorized by: Deno Etienne, DO   Consent:    Consent obtained:  Verbal   Consent given by:  Patient   Risks, benefits, and alternatives were discussed: yes     Risks discussed:  Infection, pain, poor cosmetic result and poor wound healing   Alternatives discussed:  No treatment, delayed treatment and observation Universal protocol:    Procedure explained and questions answered to patient or proxy's satisfaction: yes     Immediately prior to procedure, a time out was called: yes     Patient  identity confirmed:  Verbally with patient Anesthesia:    Anesthesia method:  Local infiltration   Local anesthetic:  Lidocaine 2% WITH epi Laceration details:    Location:  Face   Face location:  Nose   Length (cm):  5.6 Pre-procedure details:    Preparation:  Patient was prepped and draped in usual sterile fashion Exploration:    Hemostasis achieved with:  Epinephrine and direct pressure   Wound exploration: entire depth of wound visualized     Contaminated: no   Treatment:    Area cleansed with:  Saline   Amount of cleaning:  Standard   Irrigation solution:  Sterile saline   Irrigation volume:  50   Irrigation method:  Syringe and pressure wash   Visualized foreign bodies/material removed: no     Debridement:  None   Undermining:  None   Scar revision: no   Skin repair:    Repair method:  Sutures   Suture size:  5-0   Suture material:  Fast-absorbing gut   Suture technique:  Simple interrupted   Number of sutures:  5 Approximation:    Approximation:  Close Repair type:    Repair type:  Simple Post-procedure details:    Dressing:  Open (no dressing)   Procedure completion:  Tolerated well, no immediate complications     Medications Ordered in ED Medications  lidocaine-EPINEPHrine (XYLOCAINE W/EPI) 2 %-1:200000 (PF) injection 20 mL (20 mLs Infiltration Given 04/26/21 0650)  lidocaine (PF) (XYLOCAINE) 1 % injection 5 mL (5 mLs Intradermal Given 04/26/21 0808)  oxymetazoline (AFRIN) 0.05 % nasal spray 1 spray (1 spray Each Nare Given 04/26/21 0857)    ED Course  I have reviewed the triage vital signs and the nursing notes.  Pertinent labs & imaging results that were available during my care of the patient were reviewed by me and considered in my medical decision making (see chart for details).    MDM Rules/Calculators/A&P                          85 yo F with a chief complaints of fall.  Patient fell when she was getting up off the toilet.  Struck her face.   Patient is somewhat confused on arrival here.  Will obtain a CT of the head face and C-spine.  Will obtain blood work EKG.  Signed out to Dr. Billy [name], please see their note for further details of care in the ED.  The patients results and plan were reviewed and discussed.   Any x-rays performed were independently reviewed by myself.   Differential diagnosis were considered with the presenting HPI.  Medications  lidocaine-EPINEPHrine (XYLOCAINE W/EPI) 2 %-1:200000 (PF) injection 20 mL (20 mLs Infiltration Given 04/26/21 0650)  lidocaine (PF) (XYLOCAINE) 1 % injection 5 mL (5 mLs Intradermal Given 04/26/21 0808)  oxymetazoline (AFRIN) 0.05 % nasal spray 1 spray (1 spray Each Nare Given 04/26/21 0857)    Vitals:   04/26/21 0645 04/26/21 0740 04/26/21 0758 04/26/21 0830  BP:  (!) 168/84 (!) 168/84 (!) 150/76  Pulse: (!) 104  98 99  Resp: 16 17 (!) 6 15  Temp:      TempSrc:      SpO2: 97%  97% 97%  Weight:      Height:        Final diagnoses:  Nasal laceration, initial encounter  Open fracture of nasal bone, initial encounter  Laceration of oral cavity, initial encounter  Laceration of left hand without foreign body, initial encounter  Fall, initial encounter  Syncope, unspecified syncope type     Final Clinical Impression(s) / ED Diagnoses Final diagnoses:  Nasal laceration, initial encounter  Open fracture of nasal bone, initial encounter  Laceration of oral cavity, initial encounter  Laceration of left hand without foreign body, initial encounter  Fall, initial encounter  Syncope, unspecified syncope type    Rx / DC Orders ED Discharge Orders    None       Deno Etienne, DO 04/26/21 2300

## 2021-04-26 NOTE — ED Provider Notes (Signed)
Physical Exam  BP (!) 168/84 (BP Location: Right Arm)   Pulse 98   Temp (!) 97.3 F (36.3 C) (Oral)   Resp (!) 6   Ht 5\' 2"  (1.575 m)   Wt 52.6 kg   SpO2 97%   BMI 21.22 kg/m   Physical Exam HENT:     Head:     Comments: Nasal bone tenderness, swelling, laceration with sutures in place (per Dr. Tyrone Nine) Evidence of prior epistaxis, no active bleeding No nasal septal hematoma  1.5cm laceration oral mucosa upper lip Musculoskeletal:     Comments: 2cm laceration to dorsum base of thumb, normal thumb ROM, no sign of foreign body, no thumb tenderness Normal capillary refill     ED Course/Procedures     .Marland KitchenLaceration Repair  Date/Time: 04/26/2021 8:37 AM Performed by: Gareth Morgan, MD Authorized by: Gareth Morgan, MD   Consent:    Consent obtained:  Verbal   Consent given by:  Patient   Risks, benefits, and alternatives were discussed: yes     Risks discussed:  Infection, pain and need for additional repair   Alternatives discussed:  No treatment Universal protocol:    Patient identity confirmed:  Verbally with patient Anesthesia:    Anesthesia method:  None Laceration details:    Location:  Hand   Hand location:  L hand, dorsum   Length (cm):  2 Pre-procedure details:    Preparation:  Patient was prepped and draped in usual sterile fashion Exploration:    Wound exploration: entire depth of wound visualized     Contaminated: no   Treatment:    Area cleansed with:  Saline and chlorhexidine   Irrigation solution:  Sterile saline Skin repair:    Repair method:  Sutures   Suture size:  4-0   Suture material:  Prolene   Number of sutures:  3 Approximation:    Approximation:  Close Repair type:    Repair type:  Simple Post-procedure details:    Procedure completion:  Tolerated well, no immediate complications    MDM   85 year old female with history of chronic back pain and radiculopathy, traumatic subdural 1 year ago as well as a small traumatic  subdural 01/2021, presents with concern for fall in the bathroom with amnesia to event, possible syncope as etiology versus fall with head trauma and resultant syncope.  Received care of patient from Dr. Tyrone Nine.  Please see his history, physical and prior medical decision making for additional information.  Nasal laceration has been repaired, and she is currently awaiting labs and CT imaging.  EKG also reviewed by me and does not show sign of arrhythmia.  Labs obtained show stable anemia and thrombocytopenia, normal renal function and no significant electrolyte abnormalities.  CT head and cervical spine shows bilateral nasal arch and nasal septal fractures.  CT shows chronic anterior nasal septal perforation and likely chronic left-sided septal deviation with spur.  Do not see evidence of nasal septal hematoma on exam, and no active epistaxis.  Also noted oral mucosal laceration.  Discussed it would be reasonable to place suture, but given it otherwise is not gaping healing by secondary intention is also reasonable.   Also reports right leg pain.  Has contusion over her thigh, but normal range of motion of the hip, is able to weight-bear, and have low suspicion for pelvic or femur fracture.   Has laceration to left hand which was repaired as above and 3 sutures to be removed in 10 days.  Recommend continued outpatient  follow up, given number for ENT. Patient discharged in stable condition with understanding of reasons to return.             Gareth Morgan, MD 04/26/21 [phone]

## 2021-04-26 NOTE — ED Notes (Signed)
Placed pt on bedpan, unable to void

## 2021-04-26 NOTE — ED Notes (Signed)
Cleaned blood out of pts hair and removed blood from face. Husband at bedside.

## 2021-04-26 NOTE — ED Notes (Signed)
Ambulated to a w/c with assist of 2.

## 2021-04-26 NOTE — ED Triage Notes (Addendum)
Pt brought in by husband.  Reports that pt fell in the bathroom this morning.  pts husband was able to get her off the floor.  Lac to pts nose and right wrist. Pt covered in blood-bleeding controlled at this time.  Denies LOC-unwitnessed fall. Reports that she was trying to get off the toilet and lost her balance and fell onto the tub.

## 2021-04-26 NOTE — ED Notes (Signed)
Patient transported to CT 

## 2021-04-26 NOTE — Discharge Instructions (Addendum)
Return for redness drainage or if you get a fever.  The sutures that were used on your face are dissolvable that should dissolve between day 3 and day 5.  If they are still there then you can gently plucked them out with tweezers.  The area can get wet but not fully immersed underwater.  No scrubbing.  If you really want to clean it you can apply a half-and-half hydrogen peroxide solution with water on a Q-tip.  You can apply an ointment a couple times a day this could be as simple as Vaseline but could also be an antibiotic ointment if you wish. Once it is healed please try to avoid prolonged sun exposure use sunscreen.  Gells that have silicone antigens have been shown to reduce scarring and some research.    The laceration of your hand will need suture removal in 10 days.  This can be done with your primary care physician if they do suture removal, an urgent care, or you may return here.

## 2021-04-29 DIAGNOSIS — G43709 Chronic migraine without aura, not intractable, without status migrainosus: Secondary | ICD-10-CM | POA: Diagnosis not present

## 2021-04-29 DIAGNOSIS — G8929 Other chronic pain: Secondary | ICD-10-CM | POA: Diagnosis not present

## 2021-04-29 DIAGNOSIS — G629 Polyneuropathy, unspecified: Secondary | ICD-10-CM | POA: Diagnosis not present

## 2021-04-29 DIAGNOSIS — I119 Hypertensive heart disease without heart failure: Secondary | ICD-10-CM | POA: Diagnosis not present

## 2021-04-29 DIAGNOSIS — G40909 Epilepsy, unspecified, not intractable, without status epilepticus: Secondary | ICD-10-CM | POA: Diagnosis not present

## 2021-04-29 DIAGNOSIS — S065X9D Traumatic subdural hemorrhage with loss of consciousness of unspecified duration, subsequent encounter: Secondary | ICD-10-CM | POA: Diagnosis not present

## 2021-04-29 DIAGNOSIS — M549 Dorsalgia, unspecified: Secondary | ICD-10-CM | POA: Diagnosis not present

## 2021-04-29 DIAGNOSIS — R1319 Other dysphagia: Secondary | ICD-10-CM | POA: Diagnosis not present

## 2021-04-29 DIAGNOSIS — M5410 Radiculopathy, site unspecified: Secondary | ICD-10-CM | POA: Diagnosis not present

## 2021-04-30 DIAGNOSIS — G40909 Epilepsy, unspecified, not intractable, without status epilepticus: Secondary | ICD-10-CM | POA: Diagnosis not present

## 2021-04-30 DIAGNOSIS — G43709 Chronic migraine without aura, not intractable, without status migrainosus: Secondary | ICD-10-CM | POA: Diagnosis not present

## 2021-04-30 DIAGNOSIS — I119 Hypertensive heart disease without heart failure: Secondary | ICD-10-CM | POA: Diagnosis not present

## 2021-04-30 DIAGNOSIS — R1319 Other dysphagia: Secondary | ICD-10-CM | POA: Diagnosis not present

## 2021-04-30 DIAGNOSIS — M5410 Radiculopathy, site unspecified: Secondary | ICD-10-CM | POA: Diagnosis not present

## 2021-04-30 DIAGNOSIS — G8929 Other chronic pain: Secondary | ICD-10-CM | POA: Diagnosis not present

## 2021-04-30 DIAGNOSIS — M549 Dorsalgia, unspecified: Secondary | ICD-10-CM | POA: Diagnosis not present

## 2021-04-30 DIAGNOSIS — S065X9D Traumatic subdural hemorrhage with loss of consciousness of unspecified duration, subsequent encounter: Secondary | ICD-10-CM | POA: Diagnosis not present

## 2021-04-30 DIAGNOSIS — G629 Polyneuropathy, unspecified: Secondary | ICD-10-CM | POA: Diagnosis not present

## 2021-05-01 ENCOUNTER — Other Ambulatory Visit: Payer: Medicare HMO

## 2021-05-02 DIAGNOSIS — G629 Polyneuropathy, unspecified: Secondary | ICD-10-CM | POA: Diagnosis not present

## 2021-05-02 DIAGNOSIS — G43709 Chronic migraine without aura, not intractable, without status migrainosus: Secondary | ICD-10-CM | POA: Diagnosis not present

## 2021-05-02 DIAGNOSIS — I119 Hypertensive heart disease without heart failure: Secondary | ICD-10-CM | POA: Diagnosis not present

## 2021-05-02 DIAGNOSIS — M549 Dorsalgia, unspecified: Secondary | ICD-10-CM | POA: Diagnosis not present

## 2021-05-02 DIAGNOSIS — M5410 Radiculopathy, site unspecified: Secondary | ICD-10-CM | POA: Diagnosis not present

## 2021-05-02 DIAGNOSIS — G8929 Other chronic pain: Secondary | ICD-10-CM | POA: Diagnosis not present

## 2021-05-02 DIAGNOSIS — R1319 Other dysphagia: Secondary | ICD-10-CM | POA: Diagnosis not present

## 2021-05-02 DIAGNOSIS — G40909 Epilepsy, unspecified, not intractable, without status epilepticus: Secondary | ICD-10-CM | POA: Diagnosis not present

## 2021-05-02 DIAGNOSIS — S065X9D Traumatic subdural hemorrhage with loss of consciousness of unspecified duration, subsequent encounter: Secondary | ICD-10-CM | POA: Diagnosis not present

## 2021-05-05 DIAGNOSIS — M549 Dorsalgia, unspecified: Secondary | ICD-10-CM | POA: Diagnosis not present

## 2021-05-05 DIAGNOSIS — I119 Hypertensive heart disease without heart failure: Secondary | ICD-10-CM | POA: Diagnosis not present

## 2021-05-05 DIAGNOSIS — G629 Polyneuropathy, unspecified: Secondary | ICD-10-CM | POA: Diagnosis not present

## 2021-05-05 DIAGNOSIS — S065X9D Traumatic subdural hemorrhage with loss of consciousness of unspecified duration, subsequent encounter: Secondary | ICD-10-CM | POA: Diagnosis not present

## 2021-05-05 DIAGNOSIS — R1319 Other dysphagia: Secondary | ICD-10-CM | POA: Diagnosis not present

## 2021-05-05 DIAGNOSIS — M5410 Radiculopathy, site unspecified: Secondary | ICD-10-CM | POA: Diagnosis not present

## 2021-05-05 DIAGNOSIS — G43709 Chronic migraine without aura, not intractable, without status migrainosus: Secondary | ICD-10-CM | POA: Diagnosis not present

## 2021-05-05 DIAGNOSIS — G8929 Other chronic pain: Secondary | ICD-10-CM | POA: Diagnosis not present

## 2021-05-05 DIAGNOSIS — G40909 Epilepsy, unspecified, not intractable, without status epilepticus: Secondary | ICD-10-CM | POA: Diagnosis not present

## 2021-05-05 NOTE — Telephone Encounter (Signed)
I spoke to the patient's husband on DPR. Reports she fell on 04/26/21. He had helped her to the bathroom about 5:30am. He stepped out of the room while she was sitting on the commode. Says she was a little impatient and attempted to get back into her wheelchair unassisted. Fell head first into the shower. She was on speaker phone with [place] today and denies any dizziness or loss of consciousness related to the incident. She just lost her balance. She is healing nicely from her injuries. They confirmed she has already started donepezil and doing well on the medication. They expect speech and PT to start home therapy within the week.

## 2021-05-05 NOTE — Telephone Encounter (Signed)
Can we find out how the patient fell? Sudden syncope? Mechanical barrier? I would still use Aricept for memory support if it wasn't syncope. CD

## 2021-05-06 DIAGNOSIS — M549 Dorsalgia, unspecified: Secondary | ICD-10-CM | POA: Diagnosis not present

## 2021-05-06 DIAGNOSIS — R1319 Other dysphagia: Secondary | ICD-10-CM | POA: Diagnosis not present

## 2021-05-06 DIAGNOSIS — G43709 Chronic migraine without aura, not intractable, without status migrainosus: Secondary | ICD-10-CM | POA: Diagnosis not present

## 2021-05-06 DIAGNOSIS — G8929 Other chronic pain: Secondary | ICD-10-CM | POA: Diagnosis not present

## 2021-05-06 DIAGNOSIS — M5410 Radiculopathy, site unspecified: Secondary | ICD-10-CM | POA: Diagnosis not present

## 2021-05-06 DIAGNOSIS — G40909 Epilepsy, unspecified, not intractable, without status epilepticus: Secondary | ICD-10-CM | POA: Diagnosis not present

## 2021-05-06 DIAGNOSIS — G629 Polyneuropathy, unspecified: Secondary | ICD-10-CM | POA: Diagnosis not present

## 2021-05-06 DIAGNOSIS — I119 Hypertensive heart disease without heart failure: Secondary | ICD-10-CM | POA: Diagnosis not present

## 2021-05-06 DIAGNOSIS — S065X9D Traumatic subdural hemorrhage with loss of consciousness of unspecified duration, subsequent encounter: Secondary | ICD-10-CM | POA: Diagnosis not present

## 2021-05-06 NOTE — Telephone Encounter (Signed)
Thank you for clarification.

## 2021-05-08 DIAGNOSIS — G8929 Other chronic pain: Secondary | ICD-10-CM | POA: Diagnosis not present

## 2021-05-08 DIAGNOSIS — S065X9D Traumatic subdural hemorrhage with loss of consciousness of unspecified duration, subsequent encounter: Secondary | ICD-10-CM | POA: Diagnosis not present

## 2021-05-08 DIAGNOSIS — G629 Polyneuropathy, unspecified: Secondary | ICD-10-CM | POA: Diagnosis not present

## 2021-05-08 DIAGNOSIS — G40909 Epilepsy, unspecified, not intractable, without status epilepticus: Secondary | ICD-10-CM | POA: Diagnosis not present

## 2021-05-08 DIAGNOSIS — G43709 Chronic migraine without aura, not intractable, without status migrainosus: Secondary | ICD-10-CM | POA: Diagnosis not present

## 2021-05-08 DIAGNOSIS — M5410 Radiculopathy, site unspecified: Secondary | ICD-10-CM | POA: Diagnosis not present

## 2021-05-08 DIAGNOSIS — M549 Dorsalgia, unspecified: Secondary | ICD-10-CM | POA: Diagnosis not present

## 2021-05-08 DIAGNOSIS — R1319 Other dysphagia: Secondary | ICD-10-CM | POA: Diagnosis not present

## 2021-05-08 DIAGNOSIS — I119 Hypertensive heart disease without heart failure: Secondary | ICD-10-CM | POA: Diagnosis not present

## 2021-05-13 DIAGNOSIS — M549 Dorsalgia, unspecified: Secondary | ICD-10-CM | POA: Diagnosis not present

## 2021-05-13 DIAGNOSIS — G40909 Epilepsy, unspecified, not intractable, without status epilepticus: Secondary | ICD-10-CM | POA: Diagnosis not present

## 2021-05-13 DIAGNOSIS — S065X9D Traumatic subdural hemorrhage with loss of consciousness of unspecified duration, subsequent encounter: Secondary | ICD-10-CM | POA: Diagnosis not present

## 2021-05-13 DIAGNOSIS — M5410 Radiculopathy, site unspecified: Secondary | ICD-10-CM | POA: Diagnosis not present

## 2021-05-13 DIAGNOSIS — I119 Hypertensive heart disease without heart failure: Secondary | ICD-10-CM | POA: Diagnosis not present

## 2021-05-13 DIAGNOSIS — G629 Polyneuropathy, unspecified: Secondary | ICD-10-CM | POA: Diagnosis not present

## 2021-05-13 DIAGNOSIS — G43709 Chronic migraine without aura, not intractable, without status migrainosus: Secondary | ICD-10-CM | POA: Diagnosis not present

## 2021-05-13 DIAGNOSIS — R1319 Other dysphagia: Secondary | ICD-10-CM | POA: Diagnosis not present

## 2021-05-13 DIAGNOSIS — G8929 Other chronic pain: Secondary | ICD-10-CM | POA: Diagnosis not present

## 2021-05-14 DIAGNOSIS — R1319 Other dysphagia: Secondary | ICD-10-CM | POA: Diagnosis not present

## 2021-05-14 DIAGNOSIS — M5410 Radiculopathy, site unspecified: Secondary | ICD-10-CM | POA: Diagnosis not present

## 2021-05-14 DIAGNOSIS — S065X9D Traumatic subdural hemorrhage with loss of consciousness of unspecified duration, subsequent encounter: Secondary | ICD-10-CM | POA: Diagnosis not present

## 2021-05-14 DIAGNOSIS — M549 Dorsalgia, unspecified: Secondary | ICD-10-CM | POA: Diagnosis not present

## 2021-05-14 DIAGNOSIS — G43709 Chronic migraine without aura, not intractable, without status migrainosus: Secondary | ICD-10-CM | POA: Diagnosis not present

## 2021-05-14 DIAGNOSIS — G40909 Epilepsy, unspecified, not intractable, without status epilepticus: Secondary | ICD-10-CM | POA: Diagnosis not present

## 2021-05-14 DIAGNOSIS — G629 Polyneuropathy, unspecified: Secondary | ICD-10-CM | POA: Diagnosis not present

## 2021-05-14 DIAGNOSIS — G8929 Other chronic pain: Secondary | ICD-10-CM | POA: Diagnosis not present

## 2021-05-14 DIAGNOSIS — I119 Hypertensive heart disease without heart failure: Secondary | ICD-10-CM | POA: Diagnosis not present

## 2021-05-16 DIAGNOSIS — G43709 Chronic migraine without aura, not intractable, without status migrainosus: Secondary | ICD-10-CM | POA: Diagnosis not present

## 2021-05-16 DIAGNOSIS — M5410 Radiculopathy, site unspecified: Secondary | ICD-10-CM | POA: Diagnosis not present

## 2021-05-16 DIAGNOSIS — I119 Hypertensive heart disease without heart failure: Secondary | ICD-10-CM | POA: Diagnosis not present

## 2021-05-16 DIAGNOSIS — S022XXD Fracture of nasal bones, subsequent encounter for fracture with routine healing: Secondary | ICD-10-CM | POA: Diagnosis not present

## 2021-05-16 DIAGNOSIS — G8929 Other chronic pain: Secondary | ICD-10-CM | POA: Diagnosis not present

## 2021-05-16 DIAGNOSIS — G40909 Epilepsy, unspecified, not intractable, without status epilepticus: Secondary | ICD-10-CM | POA: Diagnosis not present

## 2021-05-16 DIAGNOSIS — S065X9D Traumatic subdural hemorrhage with loss of consciousness of unspecified duration, subsequent encounter: Secondary | ICD-10-CM | POA: Diagnosis not present

## 2021-05-16 DIAGNOSIS — G629 Polyneuropathy, unspecified: Secondary | ICD-10-CM | POA: Diagnosis not present

## 2021-05-16 DIAGNOSIS — R1319 Other dysphagia: Secondary | ICD-10-CM | POA: Diagnosis not present

## 2021-05-19 ENCOUNTER — Ambulatory Visit: Payer: Medicare HMO | Admitting: Neurology

## 2021-05-19 DIAGNOSIS — G40909 Epilepsy, unspecified, not intractable, without status epilepticus: Secondary | ICD-10-CM | POA: Diagnosis not present

## 2021-05-19 DIAGNOSIS — G43709 Chronic migraine without aura, not intractable, without status migrainosus: Secondary | ICD-10-CM | POA: Diagnosis not present

## 2021-05-19 DIAGNOSIS — S065X9D Traumatic subdural hemorrhage with loss of consciousness of unspecified duration, subsequent encounter: Secondary | ICD-10-CM | POA: Diagnosis not present

## 2021-05-19 DIAGNOSIS — M5410 Radiculopathy, site unspecified: Secondary | ICD-10-CM | POA: Diagnosis not present

## 2021-05-19 DIAGNOSIS — I119 Hypertensive heart disease without heart failure: Secondary | ICD-10-CM | POA: Diagnosis not present

## 2021-05-19 DIAGNOSIS — G8929 Other chronic pain: Secondary | ICD-10-CM | POA: Diagnosis not present

## 2021-05-19 DIAGNOSIS — G629 Polyneuropathy, unspecified: Secondary | ICD-10-CM | POA: Diagnosis not present

## 2021-05-19 DIAGNOSIS — R1319 Other dysphagia: Secondary | ICD-10-CM | POA: Diagnosis not present

## 2021-05-19 DIAGNOSIS — S022XXD Fracture of nasal bones, subsequent encounter for fracture with routine healing: Secondary | ICD-10-CM | POA: Diagnosis not present

## 2021-05-20 DIAGNOSIS — I119 Hypertensive heart disease without heart failure: Secondary | ICD-10-CM | POA: Diagnosis not present

## 2021-05-20 DIAGNOSIS — R1319 Other dysphagia: Secondary | ICD-10-CM | POA: Diagnosis not present

## 2021-05-20 DIAGNOSIS — G8929 Other chronic pain: Secondary | ICD-10-CM | POA: Diagnosis not present

## 2021-05-20 DIAGNOSIS — G43709 Chronic migraine without aura, not intractable, without status migrainosus: Secondary | ICD-10-CM | POA: Diagnosis not present

## 2021-05-20 DIAGNOSIS — G40909 Epilepsy, unspecified, not intractable, without status epilepticus: Secondary | ICD-10-CM | POA: Diagnosis not present

## 2021-05-20 DIAGNOSIS — G629 Polyneuropathy, unspecified: Secondary | ICD-10-CM | POA: Diagnosis not present

## 2021-05-20 DIAGNOSIS — M5410 Radiculopathy, site unspecified: Secondary | ICD-10-CM | POA: Diagnosis not present

## 2021-05-20 DIAGNOSIS — S022XXD Fracture of nasal bones, subsequent encounter for fracture with routine healing: Secondary | ICD-10-CM | POA: Diagnosis not present

## 2021-05-20 DIAGNOSIS — S065X9D Traumatic subdural hemorrhage with loss of consciousness of unspecified duration, subsequent encounter: Secondary | ICD-10-CM | POA: Diagnosis not present

## 2021-05-22 DIAGNOSIS — G43709 Chronic migraine without aura, not intractable, without status migrainosus: Secondary | ICD-10-CM | POA: Diagnosis not present

## 2021-05-22 DIAGNOSIS — R296 Repeated falls: Secondary | ICD-10-CM | POA: Diagnosis not present

## 2021-05-22 DIAGNOSIS — S065X9D Traumatic subdural hemorrhage with loss of consciousness of unspecified duration, subsequent encounter: Secondary | ICD-10-CM | POA: Diagnosis not present

## 2021-05-22 DIAGNOSIS — G40909 Epilepsy, unspecified, not intractable, without status epilepticus: Secondary | ICD-10-CM | POA: Diagnosis not present

## 2021-05-22 DIAGNOSIS — S022XXD Fracture of nasal bones, subsequent encounter for fracture with routine healing: Secondary | ICD-10-CM | POA: Diagnosis not present

## 2021-05-22 DIAGNOSIS — I119 Hypertensive heart disease without heart failure: Secondary | ICD-10-CM | POA: Diagnosis not present

## 2021-05-22 DIAGNOSIS — R1319 Other dysphagia: Secondary | ICD-10-CM | POA: Diagnosis not present

## 2021-05-22 DIAGNOSIS — G8929 Other chronic pain: Secondary | ICD-10-CM | POA: Diagnosis not present

## 2021-05-22 DIAGNOSIS — M5410 Radiculopathy, site unspecified: Secondary | ICD-10-CM | POA: Diagnosis not present

## 2021-05-22 DIAGNOSIS — R079 Chest pain, unspecified: Secondary | ICD-10-CM | POA: Diagnosis not present

## 2021-05-22 DIAGNOSIS — G629 Polyneuropathy, unspecified: Secondary | ICD-10-CM | POA: Diagnosis not present

## 2021-05-27 DIAGNOSIS — G43709 Chronic migraine without aura, not intractable, without status migrainosus: Secondary | ICD-10-CM | POA: Diagnosis not present

## 2021-05-27 DIAGNOSIS — G8929 Other chronic pain: Secondary | ICD-10-CM | POA: Diagnosis not present

## 2021-05-27 DIAGNOSIS — M5410 Radiculopathy, site unspecified: Secondary | ICD-10-CM | POA: Diagnosis not present

## 2021-05-27 DIAGNOSIS — G40909 Epilepsy, unspecified, not intractable, without status epilepticus: Secondary | ICD-10-CM | POA: Diagnosis not present

## 2021-05-27 DIAGNOSIS — S065X9D Traumatic subdural hemorrhage with loss of consciousness of unspecified duration, subsequent encounter: Secondary | ICD-10-CM | POA: Diagnosis not present

## 2021-05-27 DIAGNOSIS — R1319 Other dysphagia: Secondary | ICD-10-CM | POA: Diagnosis not present

## 2021-05-27 DIAGNOSIS — G629 Polyneuropathy, unspecified: Secondary | ICD-10-CM | POA: Diagnosis not present

## 2021-05-27 DIAGNOSIS — I119 Hypertensive heart disease without heart failure: Secondary | ICD-10-CM | POA: Diagnosis not present

## 2021-05-27 DIAGNOSIS — S022XXD Fracture of nasal bones, subsequent encounter for fracture with routine healing: Secondary | ICD-10-CM | POA: Diagnosis not present

## 2021-05-29 DIAGNOSIS — R1319 Other dysphagia: Secondary | ICD-10-CM | POA: Diagnosis not present

## 2021-05-29 DIAGNOSIS — G8929 Other chronic pain: Secondary | ICD-10-CM | POA: Diagnosis not present

## 2021-05-29 DIAGNOSIS — S065X9D Traumatic subdural hemorrhage with loss of consciousness of unspecified duration, subsequent encounter: Secondary | ICD-10-CM | POA: Diagnosis not present

## 2021-05-29 DIAGNOSIS — S022XXD Fracture of nasal bones, subsequent encounter for fracture with routine healing: Secondary | ICD-10-CM | POA: Diagnosis not present

## 2021-05-29 DIAGNOSIS — G40909 Epilepsy, unspecified, not intractable, without status epilepticus: Secondary | ICD-10-CM | POA: Diagnosis not present

## 2021-05-29 DIAGNOSIS — G43709 Chronic migraine without aura, not intractable, without status migrainosus: Secondary | ICD-10-CM | POA: Diagnosis not present

## 2021-05-29 DIAGNOSIS — M5134 Other intervertebral disc degeneration, thoracic region: Secondary | ICD-10-CM | POA: Diagnosis not present

## 2021-05-29 DIAGNOSIS — G629 Polyneuropathy, unspecified: Secondary | ICD-10-CM | POA: Diagnosis not present

## 2021-05-29 DIAGNOSIS — I119 Hypertensive heart disease without heart failure: Secondary | ICD-10-CM | POA: Diagnosis not present

## 2021-05-29 DIAGNOSIS — M5414 Radiculopathy, thoracic region: Secondary | ICD-10-CM | POA: Diagnosis not present

## 2021-05-29 DIAGNOSIS — M5410 Radiculopathy, site unspecified: Secondary | ICD-10-CM | POA: Diagnosis not present

## 2021-05-29 DIAGNOSIS — S22000A Wedge compression fracture of unspecified thoracic vertebra, initial encounter for closed fracture: Secondary | ICD-10-CM | POA: Diagnosis not present

## 2021-05-30 DIAGNOSIS — G629 Polyneuropathy, unspecified: Secondary | ICD-10-CM | POA: Diagnosis not present

## 2021-05-30 DIAGNOSIS — G43709 Chronic migraine without aura, not intractable, without status migrainosus: Secondary | ICD-10-CM | POA: Diagnosis not present

## 2021-05-30 DIAGNOSIS — I119 Hypertensive heart disease without heart failure: Secondary | ICD-10-CM | POA: Diagnosis not present

## 2021-05-30 DIAGNOSIS — G40909 Epilepsy, unspecified, not intractable, without status epilepticus: Secondary | ICD-10-CM | POA: Diagnosis not present

## 2021-05-30 DIAGNOSIS — S022XXD Fracture of nasal bones, subsequent encounter for fracture with routine healing: Secondary | ICD-10-CM | POA: Diagnosis not present

## 2021-05-30 DIAGNOSIS — R1319 Other dysphagia: Secondary | ICD-10-CM | POA: Diagnosis not present

## 2021-05-30 DIAGNOSIS — S065X9D Traumatic subdural hemorrhage with loss of consciousness of unspecified duration, subsequent encounter: Secondary | ICD-10-CM | POA: Diagnosis not present

## 2021-05-30 DIAGNOSIS — M5410 Radiculopathy, site unspecified: Secondary | ICD-10-CM | POA: Diagnosis not present

## 2021-05-30 DIAGNOSIS — G8929 Other chronic pain: Secondary | ICD-10-CM | POA: Diagnosis not present

## 2021-06-02 DIAGNOSIS — G40909 Epilepsy, unspecified, not intractable, without status epilepticus: Secondary | ICD-10-CM | POA: Diagnosis not present

## 2021-06-02 DIAGNOSIS — G629 Polyneuropathy, unspecified: Secondary | ICD-10-CM | POA: Diagnosis not present

## 2021-06-02 DIAGNOSIS — S065X9D Traumatic subdural hemorrhage with loss of consciousness of unspecified duration, subsequent encounter: Secondary | ICD-10-CM | POA: Diagnosis not present

## 2021-06-02 DIAGNOSIS — I119 Hypertensive heart disease without heart failure: Secondary | ICD-10-CM | POA: Diagnosis not present

## 2021-06-02 DIAGNOSIS — R1319 Other dysphagia: Secondary | ICD-10-CM | POA: Diagnosis not present

## 2021-06-02 DIAGNOSIS — G8929 Other chronic pain: Secondary | ICD-10-CM | POA: Diagnosis not present

## 2021-06-02 DIAGNOSIS — M5410 Radiculopathy, site unspecified: Secondary | ICD-10-CM | POA: Diagnosis not present

## 2021-06-02 DIAGNOSIS — S022XXD Fracture of nasal bones, subsequent encounter for fracture with routine healing: Secondary | ICD-10-CM | POA: Diagnosis not present

## 2021-06-02 DIAGNOSIS — G43709 Chronic migraine without aura, not intractable, without status migrainosus: Secondary | ICD-10-CM | POA: Diagnosis not present

## 2021-06-03 ENCOUNTER — Telehealth: Payer: Self-pay | Admitting: Neurology

## 2021-06-03 DIAGNOSIS — G40909 Epilepsy, unspecified, not intractable, without status epilepticus: Secondary | ICD-10-CM | POA: Diagnosis not present

## 2021-06-03 DIAGNOSIS — S022XXD Fracture of nasal bones, subsequent encounter for fracture with routine healing: Secondary | ICD-10-CM | POA: Diagnosis not present

## 2021-06-03 DIAGNOSIS — S065X9D Traumatic subdural hemorrhage with loss of consciousness of unspecified duration, subsequent encounter: Secondary | ICD-10-CM | POA: Diagnosis not present

## 2021-06-03 DIAGNOSIS — R1319 Other dysphagia: Secondary | ICD-10-CM | POA: Diagnosis not present

## 2021-06-03 DIAGNOSIS — G629 Polyneuropathy, unspecified: Secondary | ICD-10-CM | POA: Diagnosis not present

## 2021-06-03 DIAGNOSIS — M5410 Radiculopathy, site unspecified: Secondary | ICD-10-CM | POA: Diagnosis not present

## 2021-06-03 DIAGNOSIS — G8929 Other chronic pain: Secondary | ICD-10-CM | POA: Diagnosis not present

## 2021-06-03 DIAGNOSIS — G43709 Chronic migraine without aura, not intractable, without status migrainosus: Secondary | ICD-10-CM | POA: Diagnosis not present

## 2021-06-03 DIAGNOSIS — I119 Hypertensive heart disease without heart failure: Secondary | ICD-10-CM | POA: Diagnosis not present

## 2021-06-03 NOTE — Telephone Encounter (Signed)
Dr. Brett Fairy- what would you recommend?

## 2021-06-03 NOTE — Telephone Encounter (Signed)
Pt's husband called wanting to know when wife can start weaning off the levETIRAcetam (KEPPRA) 500 MG tablet, pt has been on the medication for about 4 months. Husband is asking for a call back.

## 2021-06-03 NOTE — Telephone Encounter (Signed)
Called the husband back.  Advised the patient's husband I discussed with Dr. Edwena Felty concerns.  Advised that Dr. Brett Fairy thinks it may be worth completing a EEG for the pt just to ensure there is no evidence of seizure-like activity.  Advised that patient has a upcoming appointment in August with Amy the nurse practitioner.  Given the CT scan did not appear concerning when it was reviewed by our working physician Dr. Jaynee Eagles it may be that we can reassess weaning off of the medication at that time.  The patient verbalized understanding and had no further questions at this time.  He was appreciative for the call back.

## 2021-06-03 NOTE — Telephone Encounter (Signed)
Called husband back. Dr. Brett Fairy did not discuss having her come off Keppra. However, since she is going well and has not fallen in awhile, they are wanting to know if she can taper off of this medication. Thinking medication is contributing to her confusion. Currently on Keppra 500mg  po BID. Advised we will discuss w/ MD and call back.

## 2021-06-05 DIAGNOSIS — M5410 Radiculopathy, site unspecified: Secondary | ICD-10-CM | POA: Diagnosis not present

## 2021-06-05 DIAGNOSIS — G629 Polyneuropathy, unspecified: Secondary | ICD-10-CM | POA: Diagnosis not present

## 2021-06-05 DIAGNOSIS — S065X9D Traumatic subdural hemorrhage with loss of consciousness of unspecified duration, subsequent encounter: Secondary | ICD-10-CM | POA: Diagnosis not present

## 2021-06-05 DIAGNOSIS — G40909 Epilepsy, unspecified, not intractable, without status epilepticus: Secondary | ICD-10-CM | POA: Diagnosis not present

## 2021-06-05 DIAGNOSIS — I119 Hypertensive heart disease without heart failure: Secondary | ICD-10-CM | POA: Diagnosis not present

## 2021-06-05 DIAGNOSIS — R1319 Other dysphagia: Secondary | ICD-10-CM | POA: Diagnosis not present

## 2021-06-05 DIAGNOSIS — G8929 Other chronic pain: Secondary | ICD-10-CM | POA: Diagnosis not present

## 2021-06-05 DIAGNOSIS — G43709 Chronic migraine without aura, not intractable, without status migrainosus: Secondary | ICD-10-CM | POA: Diagnosis not present

## 2021-06-05 DIAGNOSIS — S022XXD Fracture of nasal bones, subsequent encounter for fracture with routine healing: Secondary | ICD-10-CM | POA: Diagnosis not present

## 2021-06-06 DIAGNOSIS — I119 Hypertensive heart disease without heart failure: Secondary | ICD-10-CM | POA: Diagnosis not present

## 2021-06-06 DIAGNOSIS — M5410 Radiculopathy, site unspecified: Secondary | ICD-10-CM | POA: Diagnosis not present

## 2021-06-06 DIAGNOSIS — S065X9D Traumatic subdural hemorrhage with loss of consciousness of unspecified duration, subsequent encounter: Secondary | ICD-10-CM | POA: Diagnosis not present

## 2021-06-06 DIAGNOSIS — G40909 Epilepsy, unspecified, not intractable, without status epilepticus: Secondary | ICD-10-CM | POA: Diagnosis not present

## 2021-06-06 DIAGNOSIS — R1319 Other dysphagia: Secondary | ICD-10-CM | POA: Diagnosis not present

## 2021-06-06 DIAGNOSIS — G629 Polyneuropathy, unspecified: Secondary | ICD-10-CM | POA: Diagnosis not present

## 2021-06-06 DIAGNOSIS — G43709 Chronic migraine without aura, not intractable, without status migrainosus: Secondary | ICD-10-CM | POA: Diagnosis not present

## 2021-06-06 DIAGNOSIS — S022XXD Fracture of nasal bones, subsequent encounter for fracture with routine healing: Secondary | ICD-10-CM | POA: Diagnosis not present

## 2021-06-06 DIAGNOSIS — G8929 Other chronic pain: Secondary | ICD-10-CM | POA: Diagnosis not present

## 2021-06-09 DIAGNOSIS — G40909 Epilepsy, unspecified, not intractable, without status epilepticus: Secondary | ICD-10-CM | POA: Diagnosis not present

## 2021-06-09 DIAGNOSIS — G43709 Chronic migraine without aura, not intractable, without status migrainosus: Secondary | ICD-10-CM | POA: Diagnosis not present

## 2021-06-09 DIAGNOSIS — G8929 Other chronic pain: Secondary | ICD-10-CM | POA: Diagnosis not present

## 2021-06-09 DIAGNOSIS — S022XXD Fracture of nasal bones, subsequent encounter for fracture with routine healing: Secondary | ICD-10-CM | POA: Diagnosis not present

## 2021-06-09 DIAGNOSIS — G629 Polyneuropathy, unspecified: Secondary | ICD-10-CM | POA: Diagnosis not present

## 2021-06-09 DIAGNOSIS — R1319 Other dysphagia: Secondary | ICD-10-CM | POA: Diagnosis not present

## 2021-06-09 DIAGNOSIS — M5410 Radiculopathy, site unspecified: Secondary | ICD-10-CM | POA: Diagnosis not present

## 2021-06-09 DIAGNOSIS — S065X9D Traumatic subdural hemorrhage with loss of consciousness of unspecified duration, subsequent encounter: Secondary | ICD-10-CM | POA: Diagnosis not present

## 2021-06-09 DIAGNOSIS — I119 Hypertensive heart disease without heart failure: Secondary | ICD-10-CM | POA: Diagnosis not present

## 2021-06-10 DIAGNOSIS — R1319 Other dysphagia: Secondary | ICD-10-CM | POA: Diagnosis not present

## 2021-06-10 DIAGNOSIS — M5410 Radiculopathy, site unspecified: Secondary | ICD-10-CM | POA: Diagnosis not present

## 2021-06-10 DIAGNOSIS — S065X9D Traumatic subdural hemorrhage with loss of consciousness of unspecified duration, subsequent encounter: Secondary | ICD-10-CM | POA: Diagnosis not present

## 2021-06-10 DIAGNOSIS — S022XXD Fracture of nasal bones, subsequent encounter for fracture with routine healing: Secondary | ICD-10-CM | POA: Diagnosis not present

## 2021-06-10 DIAGNOSIS — G43709 Chronic migraine without aura, not intractable, without status migrainosus: Secondary | ICD-10-CM | POA: Diagnosis not present

## 2021-06-10 DIAGNOSIS — G8929 Other chronic pain: Secondary | ICD-10-CM | POA: Diagnosis not present

## 2021-06-10 DIAGNOSIS — G40909 Epilepsy, unspecified, not intractable, without status epilepticus: Secondary | ICD-10-CM | POA: Diagnosis not present

## 2021-06-10 DIAGNOSIS — I119 Hypertensive heart disease without heart failure: Secondary | ICD-10-CM | POA: Diagnosis not present

## 2021-06-10 DIAGNOSIS — G629 Polyneuropathy, unspecified: Secondary | ICD-10-CM | POA: Diagnosis not present

## 2021-06-13 DIAGNOSIS — M5410 Radiculopathy, site unspecified: Secondary | ICD-10-CM | POA: Diagnosis not present

## 2021-06-13 DIAGNOSIS — I119 Hypertensive heart disease without heart failure: Secondary | ICD-10-CM | POA: Diagnosis not present

## 2021-06-13 DIAGNOSIS — G8929 Other chronic pain: Secondary | ICD-10-CM | POA: Diagnosis not present

## 2021-06-13 DIAGNOSIS — S022XXD Fracture of nasal bones, subsequent encounter for fracture with routine healing: Secondary | ICD-10-CM | POA: Diagnosis not present

## 2021-06-13 DIAGNOSIS — G40909 Epilepsy, unspecified, not intractable, without status epilepticus: Secondary | ICD-10-CM | POA: Diagnosis not present

## 2021-06-13 DIAGNOSIS — S065X9D Traumatic subdural hemorrhage with loss of consciousness of unspecified duration, subsequent encounter: Secondary | ICD-10-CM | POA: Diagnosis not present

## 2021-06-13 DIAGNOSIS — G43709 Chronic migraine without aura, not intractable, without status migrainosus: Secondary | ICD-10-CM | POA: Diagnosis not present

## 2021-06-13 DIAGNOSIS — G629 Polyneuropathy, unspecified: Secondary | ICD-10-CM | POA: Diagnosis not present

## 2021-06-13 DIAGNOSIS — R1319 Other dysphagia: Secondary | ICD-10-CM | POA: Diagnosis not present

## 2021-06-17 DIAGNOSIS — M5410 Radiculopathy, site unspecified: Secondary | ICD-10-CM | POA: Diagnosis not present

## 2021-06-17 DIAGNOSIS — I119 Hypertensive heart disease without heart failure: Secondary | ICD-10-CM | POA: Diagnosis not present

## 2021-06-17 DIAGNOSIS — G40909 Epilepsy, unspecified, not intractable, without status epilepticus: Secondary | ICD-10-CM | POA: Diagnosis not present

## 2021-06-17 DIAGNOSIS — G8929 Other chronic pain: Secondary | ICD-10-CM | POA: Diagnosis not present

## 2021-06-17 DIAGNOSIS — R1319 Other dysphagia: Secondary | ICD-10-CM | POA: Diagnosis not present

## 2021-06-17 DIAGNOSIS — G43709 Chronic migraine without aura, not intractable, without status migrainosus: Secondary | ICD-10-CM | POA: Diagnosis not present

## 2021-06-17 DIAGNOSIS — G629 Polyneuropathy, unspecified: Secondary | ICD-10-CM | POA: Diagnosis not present

## 2021-06-17 DIAGNOSIS — S065X9D Traumatic subdural hemorrhage with loss of consciousness of unspecified duration, subsequent encounter: Secondary | ICD-10-CM | POA: Diagnosis not present

## 2021-06-17 DIAGNOSIS — S022XXD Fracture of nasal bones, subsequent encounter for fracture with routine healing: Secondary | ICD-10-CM | POA: Diagnosis not present

## 2021-06-18 DIAGNOSIS — M5414 Radiculopathy, thoracic region: Secondary | ICD-10-CM | POA: Diagnosis not present

## 2021-06-18 DIAGNOSIS — M5410 Radiculopathy, site unspecified: Secondary | ICD-10-CM | POA: Diagnosis not present

## 2021-06-18 DIAGNOSIS — G43709 Chronic migraine without aura, not intractable, without status migrainosus: Secondary | ICD-10-CM | POA: Diagnosis not present

## 2021-06-18 DIAGNOSIS — G629 Polyneuropathy, unspecified: Secondary | ICD-10-CM | POA: Diagnosis not present

## 2021-06-18 DIAGNOSIS — R1319 Other dysphagia: Secondary | ICD-10-CM | POA: Diagnosis not present

## 2021-06-18 DIAGNOSIS — G40909 Epilepsy, unspecified, not intractable, without status epilepticus: Secondary | ICD-10-CM | POA: Diagnosis not present

## 2021-06-18 DIAGNOSIS — S065X9D Traumatic subdural hemorrhage with loss of consciousness of unspecified duration, subsequent encounter: Secondary | ICD-10-CM | POA: Diagnosis not present

## 2021-06-18 DIAGNOSIS — G8929 Other chronic pain: Secondary | ICD-10-CM | POA: Diagnosis not present

## 2021-06-18 DIAGNOSIS — I119 Hypertensive heart disease without heart failure: Secondary | ICD-10-CM | POA: Diagnosis not present

## 2021-06-18 DIAGNOSIS — S022XXD Fracture of nasal bones, subsequent encounter for fracture with routine healing: Secondary | ICD-10-CM | POA: Diagnosis not present

## 2021-06-19 DIAGNOSIS — S065X9D Traumatic subdural hemorrhage with loss of consciousness of unspecified duration, subsequent encounter: Secondary | ICD-10-CM | POA: Diagnosis not present

## 2021-06-19 DIAGNOSIS — S022XXD Fracture of nasal bones, subsequent encounter for fracture with routine healing: Secondary | ICD-10-CM | POA: Diagnosis not present

## 2021-06-19 DIAGNOSIS — G40909 Epilepsy, unspecified, not intractable, without status epilepticus: Secondary | ICD-10-CM | POA: Diagnosis not present

## 2021-06-19 DIAGNOSIS — G629 Polyneuropathy, unspecified: Secondary | ICD-10-CM | POA: Diagnosis not present

## 2021-06-19 DIAGNOSIS — M5410 Radiculopathy, site unspecified: Secondary | ICD-10-CM | POA: Diagnosis not present

## 2021-06-19 DIAGNOSIS — G8929 Other chronic pain: Secondary | ICD-10-CM | POA: Diagnosis not present

## 2021-06-19 DIAGNOSIS — I119 Hypertensive heart disease without heart failure: Secondary | ICD-10-CM | POA: Diagnosis not present

## 2021-06-19 DIAGNOSIS — G43709 Chronic migraine without aura, not intractable, without status migrainosus: Secondary | ICD-10-CM | POA: Diagnosis not present

## 2021-06-19 DIAGNOSIS — R1319 Other dysphagia: Secondary | ICD-10-CM | POA: Diagnosis not present

## 2021-06-20 DIAGNOSIS — G629 Polyneuropathy, unspecified: Secondary | ICD-10-CM | POA: Diagnosis not present

## 2021-06-20 DIAGNOSIS — R1319 Other dysphagia: Secondary | ICD-10-CM | POA: Diagnosis not present

## 2021-06-20 DIAGNOSIS — G40909 Epilepsy, unspecified, not intractable, without status epilepticus: Secondary | ICD-10-CM | POA: Diagnosis not present

## 2021-06-20 DIAGNOSIS — G43709 Chronic migraine without aura, not intractable, without status migrainosus: Secondary | ICD-10-CM | POA: Diagnosis not present

## 2021-06-20 DIAGNOSIS — S022XXD Fracture of nasal bones, subsequent encounter for fracture with routine healing: Secondary | ICD-10-CM | POA: Diagnosis not present

## 2021-06-20 DIAGNOSIS — G8929 Other chronic pain: Secondary | ICD-10-CM | POA: Diagnosis not present

## 2021-06-20 DIAGNOSIS — I119 Hypertensive heart disease without heart failure: Secondary | ICD-10-CM | POA: Diagnosis not present

## 2021-06-20 DIAGNOSIS — S065X9D Traumatic subdural hemorrhage with loss of consciousness of unspecified duration, subsequent encounter: Secondary | ICD-10-CM | POA: Diagnosis not present

## 2021-06-20 DIAGNOSIS — M5410 Radiculopathy, site unspecified: Secondary | ICD-10-CM | POA: Diagnosis not present

## 2021-06-21 DIAGNOSIS — R079 Chest pain, unspecified: Secondary | ICD-10-CM | POA: Diagnosis not present

## 2021-06-21 DIAGNOSIS — R296 Repeated falls: Secondary | ICD-10-CM | POA: Diagnosis not present

## 2021-06-23 ENCOUNTER — Other Ambulatory Visit (HOSPITAL_COMMUNITY): Payer: Self-pay | Admitting: *Deleted

## 2021-06-24 ENCOUNTER — Ambulatory Visit (HOSPITAL_COMMUNITY)
Admission: RE | Admit: 2021-06-24 | Discharge: 2021-06-24 | Disposition: A | Discharge status: Home / Self Care | Payer: Medicare HMO | Source: Ambulatory Visit | Attending: Internal Medicine | Admitting: Internal Medicine

## 2021-06-24 ENCOUNTER — Other Ambulatory Visit: Payer: Self-pay

## 2021-06-24 DIAGNOSIS — S065X9D Traumatic subdural hemorrhage with loss of consciousness of unspecified duration, subsequent encounter: Secondary | ICD-10-CM | POA: Diagnosis not present

## 2021-06-24 DIAGNOSIS — G40909 Epilepsy, unspecified, not intractable, without status epilepticus: Secondary | ICD-10-CM | POA: Diagnosis not present

## 2021-06-24 DIAGNOSIS — G8929 Other chronic pain: Secondary | ICD-10-CM | POA: Diagnosis not present

## 2021-06-24 DIAGNOSIS — S022XXD Fracture of nasal bones, subsequent encounter for fracture with routine healing: Secondary | ICD-10-CM | POA: Diagnosis not present

## 2021-06-24 DIAGNOSIS — M810 Age-related osteoporosis without current pathological fracture: Secondary | ICD-10-CM | POA: Insufficient documentation

## 2021-06-24 DIAGNOSIS — I119 Hypertensive heart disease without heart failure: Secondary | ICD-10-CM | POA: Diagnosis not present

## 2021-06-24 DIAGNOSIS — M5410 Radiculopathy, site unspecified: Secondary | ICD-10-CM | POA: Diagnosis not present

## 2021-06-24 DIAGNOSIS — G43709 Chronic migraine without aura, not intractable, without status migrainosus: Secondary | ICD-10-CM | POA: Diagnosis not present

## 2021-06-24 DIAGNOSIS — G629 Polyneuropathy, unspecified: Secondary | ICD-10-CM | POA: Diagnosis not present

## 2021-06-24 DIAGNOSIS — R1319 Other dysphagia: Secondary | ICD-10-CM | POA: Diagnosis not present

## 2021-06-24 MED ORDER — DENOSUMAB 60 MG/ML ~~LOC~~ SOSY
60.0000 mg | PREFILLED_SYRINGE | Freq: Once | SUBCUTANEOUS | Status: AC
  Administered 2021-06-24: 60 mg via SUBCUTANEOUS

## 2021-06-24 MED ORDER — DENOSUMAB 60 MG/ML ~~LOC~~ SOSY
PREFILLED_SYRINGE | SUBCUTANEOUS | Status: AC
  Filled 2021-06-24: qty 1

## 2021-06-25 DIAGNOSIS — R1319 Other dysphagia: Secondary | ICD-10-CM | POA: Diagnosis not present

## 2021-06-25 DIAGNOSIS — G43709 Chronic migraine without aura, not intractable, without status migrainosus: Secondary | ICD-10-CM | POA: Diagnosis not present

## 2021-06-25 DIAGNOSIS — M5410 Radiculopathy, site unspecified: Secondary | ICD-10-CM | POA: Diagnosis not present

## 2021-06-25 DIAGNOSIS — G8929 Other chronic pain: Secondary | ICD-10-CM | POA: Diagnosis not present

## 2021-06-25 DIAGNOSIS — S065X9D Traumatic subdural hemorrhage with loss of consciousness of unspecified duration, subsequent encounter: Secondary | ICD-10-CM | POA: Diagnosis not present

## 2021-06-25 DIAGNOSIS — G40909 Epilepsy, unspecified, not intractable, without status epilepticus: Secondary | ICD-10-CM | POA: Diagnosis not present

## 2021-06-25 DIAGNOSIS — I119 Hypertensive heart disease without heart failure: Secondary | ICD-10-CM | POA: Diagnosis not present

## 2021-06-25 DIAGNOSIS — S022XXD Fracture of nasal bones, subsequent encounter for fracture with routine healing: Secondary | ICD-10-CM | POA: Diagnosis not present

## 2021-06-25 DIAGNOSIS — G629 Polyneuropathy, unspecified: Secondary | ICD-10-CM | POA: Diagnosis not present

## 2021-06-26 DIAGNOSIS — G43709 Chronic migraine without aura, not intractable, without status migrainosus: Secondary | ICD-10-CM | POA: Diagnosis not present

## 2021-06-26 DIAGNOSIS — I119 Hypertensive heart disease without heart failure: Secondary | ICD-10-CM | POA: Diagnosis not present

## 2021-06-26 DIAGNOSIS — R1319 Other dysphagia: Secondary | ICD-10-CM | POA: Diagnosis not present

## 2021-06-26 DIAGNOSIS — G8929 Other chronic pain: Secondary | ICD-10-CM | POA: Diagnosis not present

## 2021-06-26 DIAGNOSIS — S065X9D Traumatic subdural hemorrhage with loss of consciousness of unspecified duration, subsequent encounter: Secondary | ICD-10-CM | POA: Diagnosis not present

## 2021-06-26 DIAGNOSIS — S022XXD Fracture of nasal bones, subsequent encounter for fracture with routine healing: Secondary | ICD-10-CM | POA: Diagnosis not present

## 2021-06-26 DIAGNOSIS — G629 Polyneuropathy, unspecified: Secondary | ICD-10-CM | POA: Diagnosis not present

## 2021-06-26 DIAGNOSIS — M5410 Radiculopathy, site unspecified: Secondary | ICD-10-CM | POA: Diagnosis not present

## 2021-06-26 DIAGNOSIS — G40909 Epilepsy, unspecified, not intractable, without status epilepticus: Secondary | ICD-10-CM | POA: Diagnosis not present

## 2021-06-27 DIAGNOSIS — I119 Hypertensive heart disease without heart failure: Secondary | ICD-10-CM | POA: Diagnosis not present

## 2021-06-27 DIAGNOSIS — M5410 Radiculopathy, site unspecified: Secondary | ICD-10-CM | POA: Diagnosis not present

## 2021-06-27 DIAGNOSIS — S022XXD Fracture of nasal bones, subsequent encounter for fracture with routine healing: Secondary | ICD-10-CM | POA: Diagnosis not present

## 2021-06-27 DIAGNOSIS — G40909 Epilepsy, unspecified, not intractable, without status epilepticus: Secondary | ICD-10-CM | POA: Diagnosis not present

## 2021-06-27 DIAGNOSIS — G8929 Other chronic pain: Secondary | ICD-10-CM | POA: Diagnosis not present

## 2021-06-27 DIAGNOSIS — R1319 Other dysphagia: Secondary | ICD-10-CM | POA: Diagnosis not present

## 2021-06-27 DIAGNOSIS — S065X9D Traumatic subdural hemorrhage with loss of consciousness of unspecified duration, subsequent encounter: Secondary | ICD-10-CM | POA: Diagnosis not present

## 2021-06-27 DIAGNOSIS — G629 Polyneuropathy, unspecified: Secondary | ICD-10-CM | POA: Diagnosis not present

## 2021-06-27 DIAGNOSIS — G43709 Chronic migraine without aura, not intractable, without status migrainosus: Secondary | ICD-10-CM | POA: Diagnosis not present

## 2021-06-30 DIAGNOSIS — G8929 Other chronic pain: Secondary | ICD-10-CM | POA: Diagnosis not present

## 2021-06-30 DIAGNOSIS — S022XXD Fracture of nasal bones, subsequent encounter for fracture with routine healing: Secondary | ICD-10-CM | POA: Diagnosis not present

## 2021-06-30 DIAGNOSIS — I119 Hypertensive heart disease without heart failure: Secondary | ICD-10-CM | POA: Diagnosis not present

## 2021-06-30 DIAGNOSIS — S065X9D Traumatic subdural hemorrhage with loss of consciousness of unspecified duration, subsequent encounter: Secondary | ICD-10-CM | POA: Diagnosis not present

## 2021-06-30 DIAGNOSIS — M5410 Radiculopathy, site unspecified: Secondary | ICD-10-CM | POA: Diagnosis not present

## 2021-06-30 DIAGNOSIS — R1319 Other dysphagia: Secondary | ICD-10-CM | POA: Diagnosis not present

## 2021-06-30 DIAGNOSIS — G43709 Chronic migraine without aura, not intractable, without status migrainosus: Secondary | ICD-10-CM | POA: Diagnosis not present

## 2021-06-30 DIAGNOSIS — G629 Polyneuropathy, unspecified: Secondary | ICD-10-CM | POA: Diagnosis not present

## 2021-06-30 DIAGNOSIS — G40909 Epilepsy, unspecified, not intractable, without status epilepticus: Secondary | ICD-10-CM | POA: Diagnosis not present

## 2021-07-01 DIAGNOSIS — S022XXD Fracture of nasal bones, subsequent encounter for fracture with routine healing: Secondary | ICD-10-CM | POA: Diagnosis not present

## 2021-07-01 DIAGNOSIS — G8929 Other chronic pain: Secondary | ICD-10-CM | POA: Diagnosis not present

## 2021-07-01 DIAGNOSIS — M5410 Radiculopathy, site unspecified: Secondary | ICD-10-CM | POA: Diagnosis not present

## 2021-07-01 DIAGNOSIS — G40909 Epilepsy, unspecified, not intractable, without status epilepticus: Secondary | ICD-10-CM | POA: Diagnosis not present

## 2021-07-01 DIAGNOSIS — R1319 Other dysphagia: Secondary | ICD-10-CM | POA: Diagnosis not present

## 2021-07-01 DIAGNOSIS — I119 Hypertensive heart disease without heart failure: Secondary | ICD-10-CM | POA: Diagnosis not present

## 2021-07-01 DIAGNOSIS — S065X9D Traumatic subdural hemorrhage with loss of consciousness of unspecified duration, subsequent encounter: Secondary | ICD-10-CM | POA: Diagnosis not present

## 2021-07-01 DIAGNOSIS — G43709 Chronic migraine without aura, not intractable, without status migrainosus: Secondary | ICD-10-CM | POA: Diagnosis not present

## 2021-07-01 DIAGNOSIS — G629 Polyneuropathy, unspecified: Secondary | ICD-10-CM | POA: Diagnosis not present

## 2021-07-07 ENCOUNTER — Encounter (INDEPENDENT_AMBULATORY_CARE_PROVIDER_SITE_OTHER): Payer: Medicare HMO | Admitting: Ophthalmology

## 2021-07-17 ENCOUNTER — Encounter: Payer: Self-pay | Admitting: Family Medicine

## 2021-07-17 ENCOUNTER — Ambulatory Visit: Payer: Medicare HMO | Admitting: Family Medicine

## 2021-07-17 VITALS — BP 129/72 | HR 86 | Ht 62.0 in | Wt 110.0 lb

## 2021-07-17 DIAGNOSIS — S065X9A Traumatic subdural hemorrhage with loss of consciousness of unspecified duration, initial encounter: Secondary | ICD-10-CM

## 2021-07-17 DIAGNOSIS — R2681 Unsteadiness on feet: Secondary | ICD-10-CM

## 2021-07-17 DIAGNOSIS — F0390 Unspecified dementia without behavioral disturbance: Secondary | ICD-10-CM

## 2021-07-17 DIAGNOSIS — S065XAA Traumatic subdural hemorrhage with loss of consciousness status unknown, initial encounter: Secondary | ICD-10-CM

## 2021-07-17 MED ORDER — DONEPEZIL HCL 10 MG PO TABS: 5.0000 mg | ORAL_TABLET | Freq: Every day | ORAL | 3 refills | Status: AC

## 2021-07-17 NOTE — Patient Instructions (Signed)
Below is our plan:  We will increase donepezil to '10mg'$  daily. We will wean levetiracetam. Take 1 tablet every day for 2 weeks then stop. Please let me know if you have any concerns of seizure activity or unwanted side effects.   Please make sure you are staying well hydrated. I recommend 50-60 ounces daily. Well balanced diet and regular exercise encouraged. Consistent sleep schedule with 6-8 hours recommended.   Please continue follow up with care team as directed.   Follow up with Dr Brett Fairy in 4 months   You may receive a survey regarding today's visit. I encourage you to leave honest feed back as I do use this information to improve patient care. Thank you for seeing me today!

## 2021-07-17 NOTE — Progress Notes (Signed)
Chief Complaint  Patient presents with   Follow-up    New rm, alone. Here for 3 month memory f/u, pt has had a few falls and was seen at the ER May 28. Memory has been the same.      HISTORY OF PRESENT ILLNESS: 07/17/21 ALL:  [name] [name] is a 85 y.o. female here today for follow up for declining cognition and gait following multiple falls and SDH. She was seen by Dr Brett Fairy 03/2021 and started on donepezil '5mg'$ . She has tolerated medication well. She and her husband are not sure if they have noticed any significant improvement in memory.   She had a fall while trying to use the restroom 5/28. CT head showed bilateral nasal arch and septal fractures but no intracranial or cervical spine fractures. She received home PT/OT/ST. She feels this was very helpful. She was discharged about a week ago. She continues to have home health aide that comes twice a week for about 3 hours a day. She is participating at [facility] 3 days a week. She was started on tramadol '50mg'$  as needed for generalized back pain. She takes gabapentin '100mg'$  at night as well.   Her husband wishes to consider weaning off levetiracetam. She was experiencing "spells" , one with LOC, in setting of SDH in 01/2020. She was started on levetiracetam '500mg'$  BID but then weaned. She was restarted on levetiracetam 01/2021 after second SDH. She has not had any confirmed seizure activity. No EEG for review. She has continued levetiracetam '500mg'$  BID but concerned this may be causing some of her cognitive impairments.    HISTORY (copied from Dr Dohmeier's previous note)  HPI:  [name] [name] is a 85 y.o. female  and seen here as a referral  from Dr. Carmie Kanner for an evaluation of declining gait and cognition.  Before her fall in March 2021 a she was ambulating but her vision had declined due to macular degeneration- she suffered a subdural hematoma, suffered frequent seizures- one with LOC- initially- was only  then seen in [place] and had a head CT.  and the multiple spells afterwards without loss of awareness and she was treated at home, with Keppra to prevent seizures.  She had just another fall 02-06-2021 and this time fell onto the right forehead- CT showed another bleed, Speech and PT have noted ongoing decline. She was no longer following the cues, decline in cognitive ability , slurred speech.  She underwent a swallowing test at [facility] on March 2. No results were accessible in EPIC.     Her speech can be clear when she is stimulated.    REVIEW OF SYSTEMS: Out of a complete 14 system review of symptoms, the patient complains only of the following symptoms, slurred speech, weakness, cognitive delay, memory loss, and all other reviewed systems are negative.   ALLERGIES: Allergies  Allergen Reactions   Isometheptene-Dichloral-Apap Nausea Only    Midrin   Oxycodone Hcl Nausea And Vomiting and Other (See Comments)    Pt reports tolerating Vicodin     HOME MEDICATIONS: Outpatient Medications Prior to Visit  Medication Sig Dispense Refill   acetaminophen (TYLENOL) 325 MG tablet Take 2 tablets (650 mg total) by mouth every 6 (six) hours as needed for mild pain or headache.     artificial tears (LACRILUBE) OINT ophthalmic ointment Place into both eyes every 3 (three) hours as needed for dry eyes.     docusate sodium (COLACE) 100  MG capsule Take 100 mg by mouth daily.     gabapentin (NEURONTIN) 100 MG capsule Take 100 mg by mouth 2 (two) times daily.     hydrOXYzine (ATARAX/VISTARIL) 25 MG tablet Take 25 mg by mouth every 8 (eight) hours as needed.     levETIRAcetam (KEPPRA) 500 MG tablet Take 1 tablet (500 mg total) by mouth 2 (two) times daily. 60 tablet 1   loratadine (CLARITIN) 10 MG tablet Take 10 mg by mouth at bedtime.     melatonin 3 MG TABS tablet Take 1 tablet (3 mg total) by mouth at bedtime as needed.  0   omeprazole (PRILOSEC) 40 MG capsule Take 40 mg by mouth daily.     polyethylene  glycol (MIRALAX / GLYCOLAX) packet Take 17 g by mouth 2 (two) times daily as needed. (Patient taking differently: Take 17 g by mouth daily as needed for mild constipation.) 14 each 0   donepezil (ARICEPT) 5 MG tablet Take 1 tablet (5 mg total) by mouth at bedtime. 30 tablet 5   Multiple Vitamins-Minerals (CENTRUM SILVER ADULT 50+ PO) Take 1 tablet by mouth daily.     pantoprazole (PROTONIX) 40 MG tablet Take 40 mg by mouth 2 (two) times daily.     No facility-administered medications prior to visit.     PAST MEDICAL HISTORY: Past Medical History:  Diagnosis Date   Acid reflux disease    Anemia    history of   Arthritis    both hands   Blood transfusion without reported diagnosis    Cataract    bilateral cateracts removed   Common migraine    Diverticulosis    patient denies   DJD (degenerative joint disease)    Esophageal stricture    Herniated disc    Hiatal hernia 2009   EGD-Small    History of colonic polyps    Hyperlipidemia    Lacunar infarction ([place])    Macular degeneration    Macular degeneration 2016   per husband   Osteoporosis    Sleep apnea    has c-pap but does not wear   Spinal stress fracture 2019   per husband 09/29/2018     PAST SURGICAL HISTORY: Past Surgical History:  Procedure Laterality Date   CHOLECYSTECTOMY     4-5 YRS AGO.   COLONOSCOPY     ESOPHAGEAL DILATION     EYE SURGERY     BILATERAL CATARAT   INGUINAL HERNIA REPAIR     rt.   RECTOCELE REPAIR N/A 02/13/2013   Procedure: POSTERIOR REPAIR (RECTOCELE);  Surgeon: Emily Filbert, MD;  Location: [facility];  Service: Gynecology;  Laterality: N/A;   ROBOTIC ASSISTED LAPAROSCOPIC LYSIS OF ADHESION  02/13/2016   Procedure: ROBOTIC ASSISTED LAPAROSCOPIC LYSIS OF ADHESION;  Surgeon: Princess Bruins, MD;  Location: [facility];  Service: Gynecology;;   ROBOTIC ASSISTED LAPAROSCOPIC SACROCOLPOPEXY N/A 02/13/2016   Procedure: ROBOTIC ASSISTED LAPAROSCOPIC SACROCOLPOPEXY With Theola Sequin;  Surgeon:  Princess Bruins, MD;  Location: [facility];  Service: Gynecology;  Laterality: N/A;   TOTAL ABDOMINAL HYSTERECTOMY     UPPER GASTROINTESTINAL ENDOSCOPY       FAMILY HISTORY: Family History  Problem Relation Age of Onset   Breast cancer Maternal Aunt    Colon cancer Maternal Aunt    Dementia Mother    Cancer Brother        kidney cancer   Aneurysm Sister 1       died    Breast cancer Paternal 23  Esophageal cancer Neg Hx    Rectal cancer Neg Hx    Stomach cancer Neg Hx      SOCIAL HISTORY: Social History   Socioeconomic History   Marital status: Married    Spouse name: Not on file   Number of children: Not on file   Years of education: Not on file   Highest education level: Not on file  Occupational History   Occupation: retired    Comment: now volunteers at hospital  Tobacco Use   Smoking status: Former    Years: 10.00    Types: Cigarettes   Smokeless tobacco: Never  Vaping Use   Vaping Use: Never used  Substance and Sexual Activity   Alcohol use: No   Drug use: No   Sexual activity: Yes    Partners: Male  Other Topics Concern   Not on file  Social History Narrative   Not on file   Social Determinants of Health   Financial Resource Strain: Not on file  Food Insecurity: Not on file  Transportation Needs: Not on file  Physical Activity: Not on file  Stress: Not on file  Social Connections: Not on file  Intimate Partner Violence: Not on file     PHYSICAL EXAM  Vitals:   07/17/21 0953  BP: 129/72  Pulse: 86  Weight: 110 lb (49.9 kg)  Height: '5\' 2"'$  (1.575 m)   Body mass index is 20.12 kg/m.   Generalized: Well developed, in no acute distress  Cardiology: normal rate and rhythm, no murmur auscultated  Respiratory: clear to auscultation bilaterally    Neurological examination  Mentation: Alert oriented to time, place, history taking. Follows all commands speech and language fluent. No slurred speech with today's evaluation.  Cranial  nerve II-XII: Pupils were equal round reactive to light. Legally blind, macular degeneration. Facial sensation and strength were normal. Head turning and shoulder shrug  were normal and symmetric. Motor: The motor testing reveals 4/5 upper extremities, 3+/5 lowers.  Sensory: Sensory testing is intact to soft touch on all 4 extremities. No evidence of extinction is noted.  Coordination: unable to perform Gait and station: Gait not assessed, can transfer with 1-2 person assist.     DIAGNOSTIC DATA (LABS, IMAGING, TESTING) - I reviewed patient records, labs, notes, testing and imaging myself where available.  Lab Results  Component Value Date   WBC 3.8 (L) 04/26/2021   HGB 11.5 (L) 04/26/2021   HCT 34.9 (L) 04/26/2021   MCV 89.9 04/26/2021   PLT 138 (L) 04/26/2021      Component Value Date/Time   NA 142 04/26/2021 0621   K 3.6 04/26/2021 0621   CL 107 04/26/2021 0621   CO2 26 04/26/2021 0621   GLUCOSE 112 (H) 04/26/2021 0621   BUN 11 04/26/2021 0621   CREATININE 0.57 04/26/2021 0621   CALCIUM 9.0 04/26/2021 0621   PROT 7.2 02/06/2021 1450   ALBUMIN 4.3 02/06/2021 1450   AST 31 02/06/2021 1450   ALT 17 02/06/2021 1450   ALKPHOS 49 02/06/2021 1450   BILITOT 0.5 02/06/2021 1450   GFRNONAA >60 04/26/2021 0621   GFRAA >60 02/09/2020 2125   Lab Results  Component Value Date   CHOL 156 10/09/2014   HDL 43 10/09/2014   LDLCALC 81 10/09/2014   TRIG 161 (H) 10/09/2014   CHOLHDL 3.6 10/09/2014   Lab Results  Component Value Date   HGBA1C 6.0 02/10/2008   No results found for: [id number] Lab Results  Component Value Date  TSH 1.36 02/10/2008    MMSE - Mini Mental State Exam 07/17/2021 04/16/2021  Orientation to time 2 1  Orientation to Place 3 2  Registration 3 3  Attention/ Calculation 5 5  Recall 1 0  Language- name 2 objects 2 2  Language- repeat 1 1  Language- follow 3 step command 3 3  Language- read & follow direction - 0  Language-read & follow  direction-comments - unable to do, legally blind  Write a sentence - 1  Copy design - 0  Copy design-comments - unable to do, legally blind  Total score - 18     No flowsheet data found.   ASSESSMENT AND PLAN  85 y.o. year old female  has a past medical history of Acid reflux disease, Anemia, Arthritis, Blood transfusion without reported diagnosis, Cataract, Common migraine, Diverticulosis, DJD (degenerative joint disease), Esophageal stricture, Herniated disc, Hiatal hernia (2009), History of colonic polyps, Hyperlipidemia, Lacunar infarction ([place]), Macular degeneration, Macular degeneration (2016), Osteoporosis, Sleep apnea, and Spinal stress fracture (2019). here with    SDH (subdural hematoma) (H[facility])  Dementia without behavioral disturbance, unspecified dementia type ([place])  Gait instability  Teressia seems to be doing a little better from a physical and mental standpoint. Unable to complete MMSE, however, she did do better with orientation and recall. No falls since 03/2021. She is tolerating donepezil. We will increase dose to '10mg'$  daily. Most recent CT 5/28 was unremarkable with exception of nasal fracture. No seizure activity. Mr Crumb wishes to wean levetiracetam. I will have her take '500mg'$  daily for two weeks, then stop. They will notify me immediately of any seizure like activity or other concerns. EEG declined. Fall precautions reviewed. She will focus on healthy lifestyle habits. Continue regular follow up with care team. She will follow up with Dr Brett Fairy in 4 months.   No orders of the defined types were placed in this encounter.    Meds ordered this encounter  Medications   donepezil (ARICEPT) 10 MG tablet    Sig: Take 0.5 tablets (5 mg total) by mouth at bedtime.    Dispense:  90 tablet    Refill:  3    Order Specific Question:   Supervising Provider    Answer:   Melvenia Beam [id number]       Debbora Presto, MSN, FNP-C 07/17/2021, 12:19 PM  [facility] [address], [place], [place] [address] [phone]

## 2021-07-22 DIAGNOSIS — R296 Repeated falls: Secondary | ICD-10-CM | POA: Diagnosis not present

## 2021-07-22 DIAGNOSIS — R079 Chest pain, unspecified: Secondary | ICD-10-CM | POA: Diagnosis not present

## 2021-08-10 IMAGING — CT CT MAXILLOFACIAL W/O CM
1 series · 15 of 30 positions shown, 19 images · non-contrast
Comparison: None.

CLINICAL DATA: Status post trauma.

EXAM:
CT MAXILLOFACIAL WITHOUT CONTRAST
TECHNIQUE: Multidetector CT imaging of the maxillofacial structures was
performed. Multiplanar CT image reconstructions were also generated.

[Series 2: 1 max soft · axial · 0.30mm/px · z∈[+180,+338]mm · 15 of 85 slices shown, 19 images]
[im 3/85  brain]
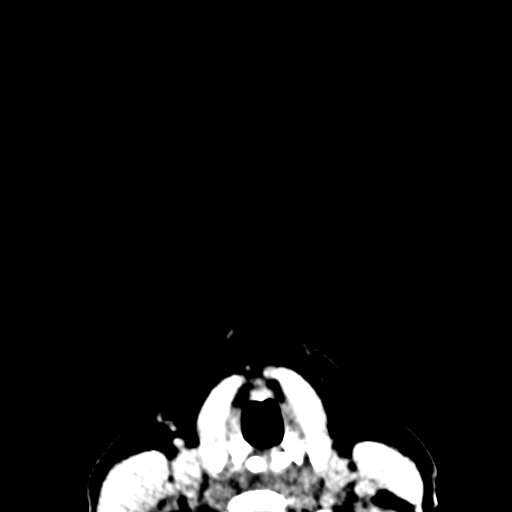
[im 3/85  bone]
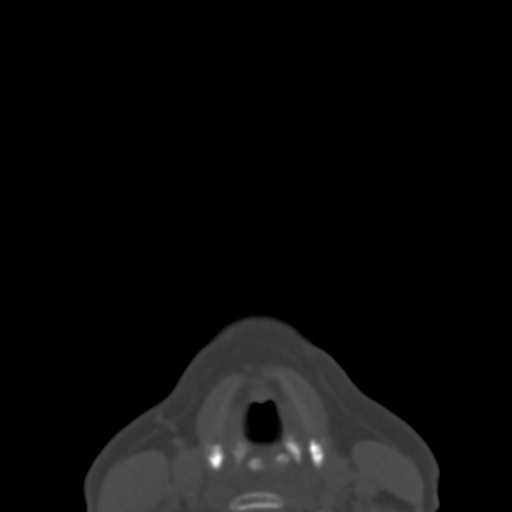
[im 9/85  bone]
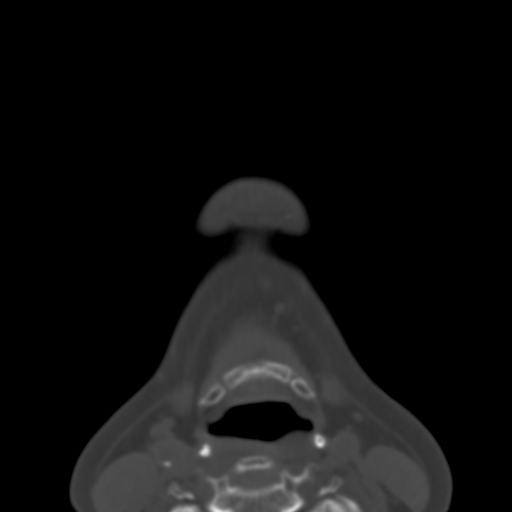
[im 15/85  bone]
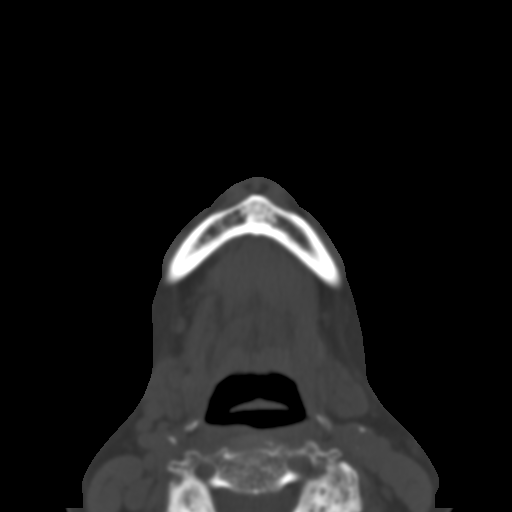
[im 21/85  bone]
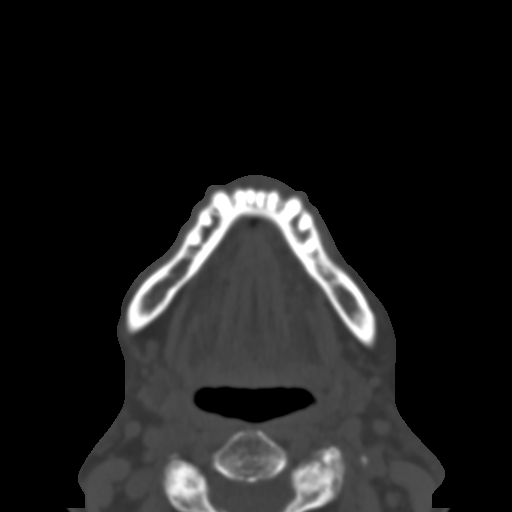
[im 27/85  brain]
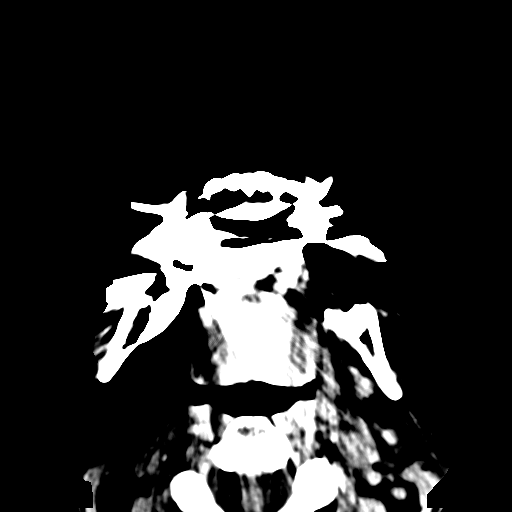
[im 27/85  bone]
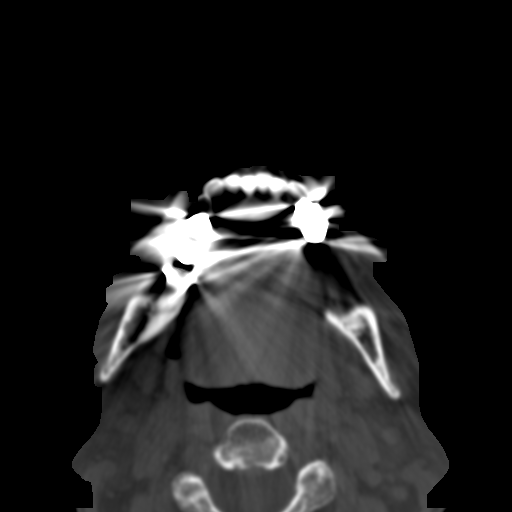
[im 32/85  bone]
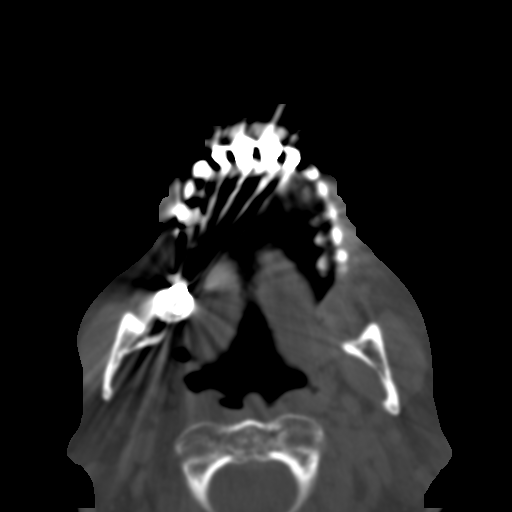
[im 38/85  bone]
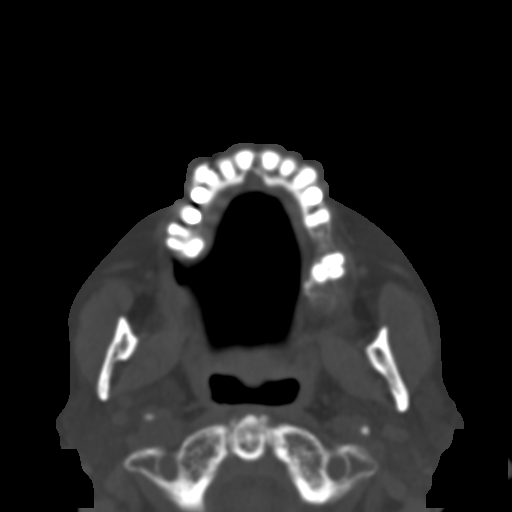
[im 44/85  bone]
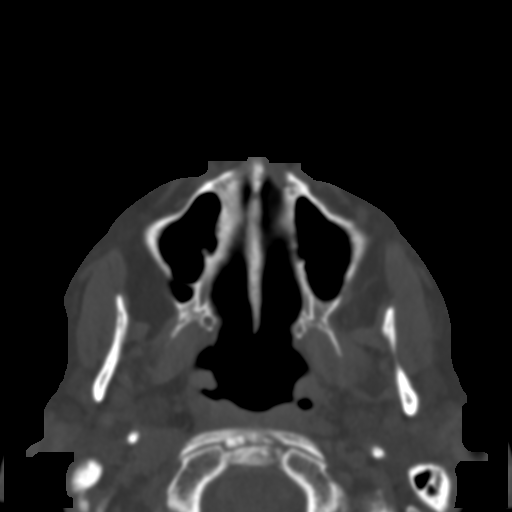
[im 47/85  brain]
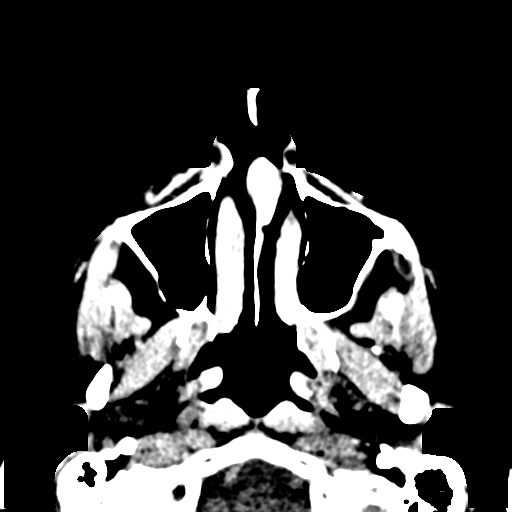
[im 47/85  bone]
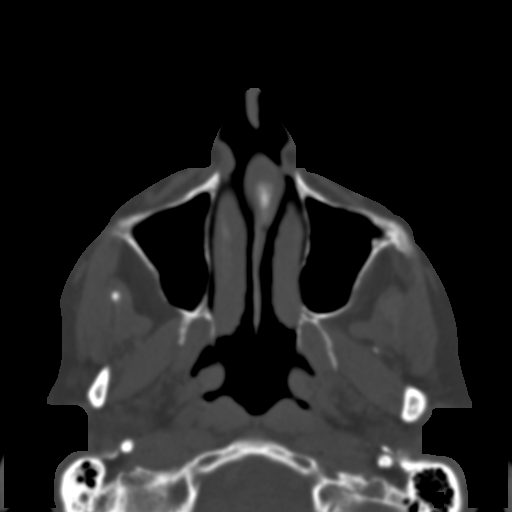
[im 53/85  bone]
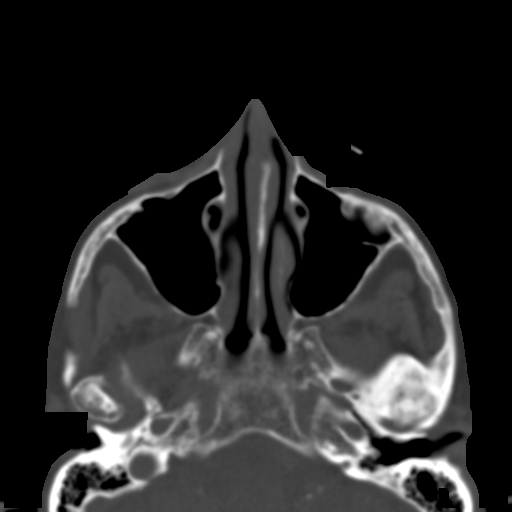
[im 58/85  bone]
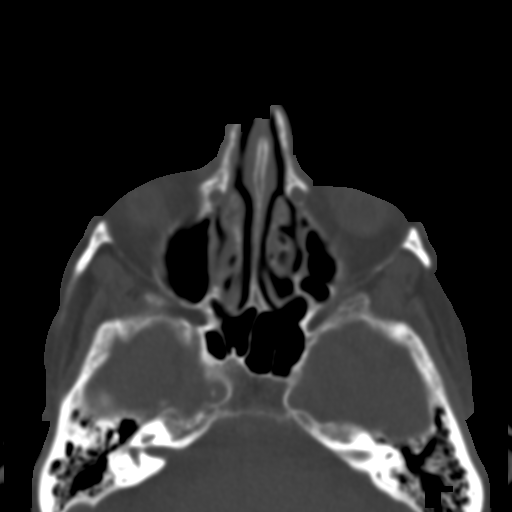
[im 64/85  bone]
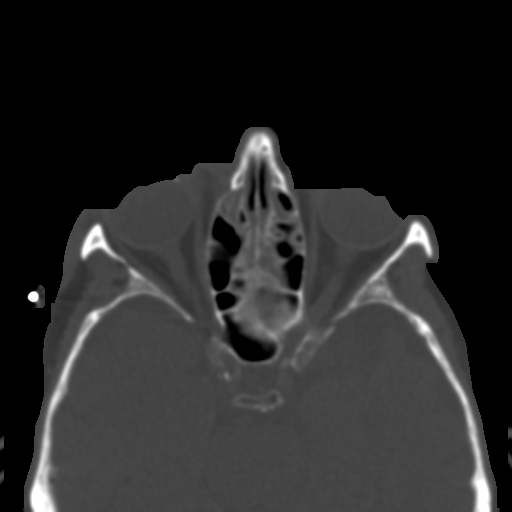
[im 70/85  brain]
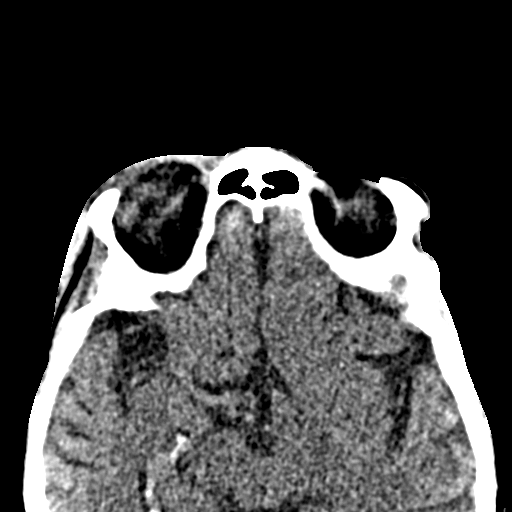
[im 70/85  bone]
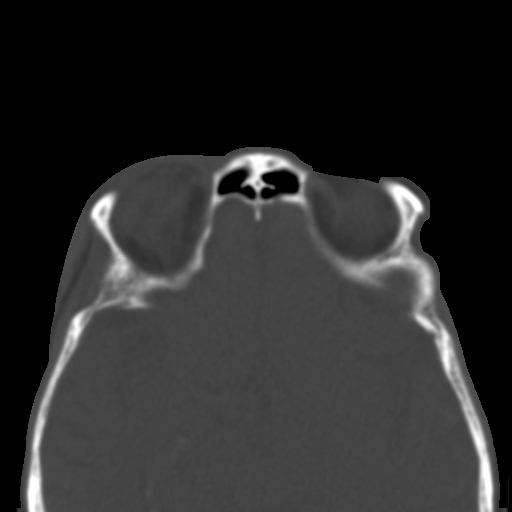
[im 76/85  bone]
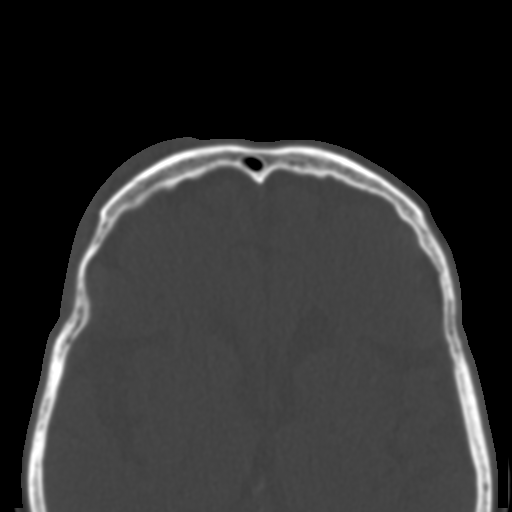
[im 82/85  bone]
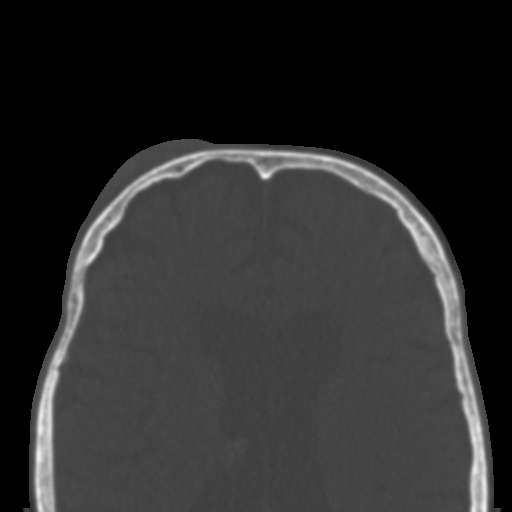

[15 of 30 positions shown; findings below may reference images not displayed]

FINDINGS: Osseous: No fracture or mandibular dislocation. No destructive
process.

Orbits: Negative. No traumatic or inflammatory finding.

Sinuses: There is moderate severity bilateral ethmoid sinus mucosal
thickening.

Soft tissues: There is mild right facial soft tissue swelling.
Moderate severity right periorbital, right preseptal and right
frontal scalp soft tissue swelling is also noted.

Limited intracranial: Small foci of acute subdural blood are seen
within the right frontal and right frontal parietal regions
(described in detail on the plain brain CT).
IMPRESSION: 1. Small foci of acute subdural blood within the right frontal and
right frontal parietal regions (described in detail on the plain
brain CT).
2. Right facial, right periorbital, right preseptal and right
frontal scalp soft tissue swelling.
3. No evidence of an acute osseous abnormality.

## 2021-08-15 DIAGNOSIS — R350 Frequency of micturition: Secondary | ICD-10-CM | POA: Diagnosis not present

## 2021-08-15 DIAGNOSIS — R531 Weakness: Secondary | ICD-10-CM | POA: Diagnosis not present

## 2021-08-15 DIAGNOSIS — S065X9A Traumatic subdural hemorrhage with loss of consciousness of unspecified duration, initial encounter: Secondary | ICD-10-CM | POA: Diagnosis not present

## 2021-08-15 DIAGNOSIS — G3184 Mild cognitive impairment, so stated: Secondary | ICD-10-CM | POA: Diagnosis not present

## 2021-08-15 DIAGNOSIS — G40909 Epilepsy, unspecified, not intractable, without status epilepticus: Secondary | ICD-10-CM | POA: Diagnosis not present

## 2021-08-15 DIAGNOSIS — N390 Urinary tract infection, site not specified: Secondary | ICD-10-CM | POA: Diagnosis not present

## 2021-08-15 DIAGNOSIS — R410 Disorientation, unspecified: Secondary | ICD-10-CM | POA: Diagnosis not present

## 2021-08-15 DIAGNOSIS — I10 Essential (primary) hypertension: Secondary | ICD-10-CM | POA: Diagnosis not present

## 2021-08-15 DIAGNOSIS — D61818 Other pancytopenia: Secondary | ICD-10-CM | POA: Diagnosis not present

## 2021-08-22 DIAGNOSIS — E781 Pure hyperglyceridemia: Secondary | ICD-10-CM | POA: Diagnosis not present

## 2021-08-22 DIAGNOSIS — R079 Chest pain, unspecified: Secondary | ICD-10-CM | POA: Diagnosis not present

## 2021-08-22 DIAGNOSIS — I10 Essential (primary) hypertension: Secondary | ICD-10-CM | POA: Diagnosis not present

## 2021-08-22 DIAGNOSIS — R296 Repeated falls: Secondary | ICD-10-CM | POA: Diagnosis not present

## 2021-09-04 DIAGNOSIS — L905 Scar conditions and fibrosis of skin: Secondary | ICD-10-CM | POA: Diagnosis not present

## 2021-09-04 DIAGNOSIS — L720 Epidermal cyst: Secondary | ICD-10-CM | POA: Diagnosis not present

## 2021-09-04 DIAGNOSIS — L738 Other specified follicular disorders: Secondary | ICD-10-CM | POA: Diagnosis not present

## 2021-09-04 DIAGNOSIS — L821 Other seborrheic keratosis: Secondary | ICD-10-CM | POA: Diagnosis not present

## 2021-09-04 DIAGNOSIS — B070 Plantar wart: Secondary | ICD-10-CM | POA: Diagnosis not present

## 2021-09-04 DIAGNOSIS — L814 Other melanin hyperpigmentation: Secondary | ICD-10-CM | POA: Diagnosis not present

## 2021-09-04 DIAGNOSIS — D225 Melanocytic nevi of trunk: Secondary | ICD-10-CM | POA: Diagnosis not present

## 2021-10-25 DIAGNOSIS — R296 Repeated falls: Secondary | ICD-10-CM | POA: Diagnosis not present

## 2021-10-25 DIAGNOSIS — R278 Other lack of coordination: Secondary | ICD-10-CM | POA: Diagnosis not present

## 2021-10-25 DIAGNOSIS — R293 Abnormal posture: Secondary | ICD-10-CM | POA: Diagnosis not present

## 2021-10-25 DIAGNOSIS — M6281 Muscle weakness (generalized): Secondary | ICD-10-CM | POA: Diagnosis not present

## 2021-10-25 DIAGNOSIS — R41841 Cognitive communication deficit: Secondary | ICD-10-CM | POA: Diagnosis not present

## 2021-10-25 DIAGNOSIS — Z741 Need for assistance with personal care: Secondary | ICD-10-CM | POA: Diagnosis not present

## 2021-10-25 DIAGNOSIS — G40909 Epilepsy, unspecified, not intractable, without status epilepticus: Secondary | ICD-10-CM | POA: Diagnosis not present

## 2021-10-25 DIAGNOSIS — F03911 Unspecified dementia, unspecified severity, with agitation: Secondary | ICD-10-CM | POA: Diagnosis not present

## 2021-10-25 DIAGNOSIS — R1312 Dysphagia, oropharyngeal phase: Secondary | ICD-10-CM | POA: Diagnosis not present

## 2021-10-27 DIAGNOSIS — E46 Unspecified protein-calorie malnutrition: Secondary | ICD-10-CM | POA: Diagnosis not present

## 2021-10-27 DIAGNOSIS — R293 Abnormal posture: Secondary | ICD-10-CM | POA: Diagnosis not present

## 2021-10-27 DIAGNOSIS — Z043 Encounter for examination and observation following other accident: Secondary | ICD-10-CM | POA: Diagnosis not present

## 2021-10-27 DIAGNOSIS — S32020A Wedge compression fracture of second lumbar vertebra, initial encounter for closed fracture: Secondary | ICD-10-CM | POA: Diagnosis not present

## 2021-10-27 DIAGNOSIS — G40909 Epilepsy, unspecified, not intractable, without status epilepticus: Secondary | ICD-10-CM | POA: Diagnosis not present

## 2021-10-27 DIAGNOSIS — R5381 Other malaise: Secondary | ICD-10-CM | POA: Diagnosis not present

## 2021-10-27 DIAGNOSIS — R41841 Cognitive communication deficit: Secondary | ICD-10-CM | POA: Diagnosis not present

## 2021-10-27 DIAGNOSIS — R278 Other lack of coordination: Secondary | ICD-10-CM | POA: Diagnosis not present

## 2021-10-27 DIAGNOSIS — S22070A Wedge compression fracture of T9-T10 vertebra, initial encounter for closed fracture: Secondary | ICD-10-CM | POA: Diagnosis not present

## 2021-10-27 DIAGNOSIS — S22080A Wedge compression fracture of T11-T12 vertebra, initial encounter for closed fracture: Secondary | ICD-10-CM | POA: Diagnosis not present

## 2021-10-27 DIAGNOSIS — R1312 Dysphagia, oropharyngeal phase: Secondary | ICD-10-CM | POA: Diagnosis not present

## 2021-10-27 DIAGNOSIS — M6281 Muscle weakness (generalized): Secondary | ICD-10-CM | POA: Diagnosis not present

## 2021-10-27 DIAGNOSIS — F03911 Unspecified dementia, unspecified severity, with agitation: Secondary | ICD-10-CM | POA: Diagnosis not present

## 2021-10-27 DIAGNOSIS — I6203 Nontraumatic chronic subdural hemorrhage: Secondary | ICD-10-CM | POA: Diagnosis not present

## 2021-10-27 DIAGNOSIS — R296 Repeated falls: Secondary | ICD-10-CM | POA: Diagnosis not present

## 2021-10-27 DIAGNOSIS — Z741 Need for assistance with personal care: Secondary | ICD-10-CM | POA: Diagnosis not present

## 2021-10-27 DIAGNOSIS — R1310 Dysphagia, unspecified: Secondary | ICD-10-CM | POA: Diagnosis not present

## 2021-10-27 DIAGNOSIS — H3530 Unspecified macular degeneration: Secondary | ICD-10-CM | POA: Diagnosis not present

## 2021-10-28 DIAGNOSIS — R296 Repeated falls: Secondary | ICD-10-CM | POA: Diagnosis not present

## 2021-10-28 DIAGNOSIS — M6281 Muscle weakness (generalized): Secondary | ICD-10-CM | POA: Diagnosis not present

## 2021-10-28 DIAGNOSIS — R278 Other lack of coordination: Secondary | ICD-10-CM | POA: Diagnosis not present

## 2021-10-28 DIAGNOSIS — R41841 Cognitive communication deficit: Secondary | ICD-10-CM | POA: Diagnosis not present

## 2021-10-28 DIAGNOSIS — G40909 Epilepsy, unspecified, not intractable, without status epilepticus: Secondary | ICD-10-CM | POA: Diagnosis not present

## 2021-10-28 DIAGNOSIS — R293 Abnormal posture: Secondary | ICD-10-CM | POA: Diagnosis not present

## 2021-10-28 DIAGNOSIS — Z741 Need for assistance with personal care: Secondary | ICD-10-CM | POA: Diagnosis not present

## 2021-10-28 DIAGNOSIS — R1312 Dysphagia, oropharyngeal phase: Secondary | ICD-10-CM | POA: Diagnosis not present

## 2021-10-28 DIAGNOSIS — F03911 Unspecified dementia, unspecified severity, with agitation: Secondary | ICD-10-CM | POA: Diagnosis not present

## 2021-10-29 DIAGNOSIS — G40909 Epilepsy, unspecified, not intractable, without status epilepticus: Secondary | ICD-10-CM | POA: Diagnosis not present

## 2021-10-29 DIAGNOSIS — F03911 Unspecified dementia, unspecified severity, with agitation: Secondary | ICD-10-CM | POA: Diagnosis not present

## 2021-10-29 DIAGNOSIS — R293 Abnormal posture: Secondary | ICD-10-CM | POA: Diagnosis not present

## 2021-10-29 DIAGNOSIS — M6281 Muscle weakness (generalized): Secondary | ICD-10-CM | POA: Diagnosis not present

## 2021-10-29 DIAGNOSIS — R278 Other lack of coordination: Secondary | ICD-10-CM | POA: Diagnosis not present

## 2021-10-29 DIAGNOSIS — Z741 Need for assistance with personal care: Secondary | ICD-10-CM | POA: Diagnosis not present

## 2021-10-29 DIAGNOSIS — R1312 Dysphagia, oropharyngeal phase: Secondary | ICD-10-CM | POA: Diagnosis not present

## 2021-10-29 DIAGNOSIS — R296 Repeated falls: Secondary | ICD-10-CM | POA: Diagnosis not present

## 2021-10-29 DIAGNOSIS — R41841 Cognitive communication deficit: Secondary | ICD-10-CM | POA: Diagnosis not present

## 2021-11-18 ENCOUNTER — Ambulatory Visit: Payer: Medicare HMO | Admitting: Neurology

## 2022-11-30 DEATH — deceased
